# Patient Record
Sex: Male | Born: 1985 | Race: Black or African American | Hispanic: No
Health system: Western US, Academic
[De-identification: ages and names within clinical notes are randomized; demographics above are authoritative.]

## PROBLEM LIST (undated history)

## (undated) MED ORDER — WARFARIN SODIUM 5 MG OR TABS
ORAL_TABLET | ORAL | Status: AC
Start: 2019-03-10 — End: ?

## (undated) MED ORDER — ENOXAPARIN SODIUM 80 MG/0.8ML IJ SOSY
PREFILLED_SYRINGE | INTRAMUSCULAR | 0 refills | Status: AC
Start: 2018-04-13 — End: ?

## (undated) MED ORDER — ENOXAPARIN SODIUM 80 MG/0.8ML IJ SOSY
80.0000 mg | PREFILLED_SYRINGE | Freq: Two times a day (BID) | INTRAMUSCULAR | 0 refills | Status: AC
Start: 2018-03-24 — End: ?

## (undated) MED ORDER — WARFARIN SODIUM 5 MG OR TABS
ORAL_TABLET | ORAL | 0 refills | Status: AC
Start: 2021-08-14 — End: ?

## (undated) MED ORDER — WARFARIN SODIUM 5 MG OR TABS
ORAL_TABLET | ORAL | Status: AC
Start: 2019-02-23 — End: ?

## (undated) MED ORDER — WARFARIN SODIUM 5 MG OR TABS
ORAL_TABLET | ORAL | 0 refills | Status: AC
Start: 2023-03-23 — End: ?

## (undated) MED ORDER — WARFARIN SODIUM 5 MG OR TABS
ORAL_TABLET | ORAL | 0 refills | Status: AC
Start: 2019-06-14 — End: ?

## (undated) NOTE — Progress Notes (Signed)
Transcription accepted by Dedra Skeens LYNN on 05/21/2012 at 10:36 AM  ------  INTERNAL MEDICINE CLINIC NOTE    IDENTIFICATION:  The patient is a 86 year old male here today for ER followup.      HISTORY OF PRESENT ILLNESS:  1.  On August 9, the patient developed a headache at 8 a.m.  He described this as a throbbing headache on the right side of his head, which was associated with bilateral blurry vision.  He has a history of headaches in the past, which he describes as happening every few months, occurring on one or the other side of his head and generally lasting for 30 minutes.  Headache last week was different, as it persisted longer than for 30 minutes and was associated with bilateral blurry vision.  He reported a headache to his boss is at work and was instructed to seek medical attention.  He went to sit down and felt somewhat dizzy and 911 was called.      The patient was evaluated in the emergency room at the [facility].  There he had evaluation including CT and CTA of the head and neck.  These showed no evidence of a new stroke.  Per the ER report, Neurology was consulted.  There is no note from the neurologist.  Based on the ER note, it was felt that his headache and blurry vision were most likely associated with migraine or anxiety; however, the patient was told by a white-haired neurologist wearing a tie that he had had a small stroke.  He was instructed to start on baby aspirin and to consult with the anticoagulation as his INR was 2.3 and his goal is 2.5 to 3.5.      Since his ER visit, he has felt tired, but has had no recurrence of headache or any neurologic symptoms.  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  The patient is on chronic Coumadin.  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    2.   Contraception.  The patient has a daughter.  He is currently working at a low paying job.  He realizes that he does not have the means to have additional children.  He is interested in pursuing a vasectomy.      PHYSICAL EXAMINATION:    General:    The patient is a well-appearing male.    Vital Signs:    Blood pressure of 120/70, pulse 64 and regular, weight of 164 pounds.    Heent:    Pupils are equal and reactive to light and accommodation.  Extraocular movements intact.  He has normal finger-nose-finger.  Negative Romberg's.        ASSESSMENT AND PLAN:  1.  Headache.  The patient has a history consistent with possible migraine headaches.  His episodes last week, which was associated with bilateral blurry vision may have represented migraine versus other cause of headache.  It is reassuring that his CT scans showed no new changes from previous examinations.  I discussed the case with the neurology consultation resident who was not the resident who saw the patient.  He was unable to say whether or not the patient may have had a small TIA.  Regardless, the patient will have his Coumadin adjusted to reach goal of 2.5 to 3.5.  He has followup with Cardiology scheduled  for September.    2.  Contraception.  I discussed with the patient that may not be possible to even consider a vasectomy in someone his age and if we did consider it there were some coagulation issues.  I will relay this information to his cardiologist and they can discuss at his upcoming visit as well.    Overall plan, the patient to be seen in Cardiology Clinic in September.  He was encouraged to continue to seek medical attention should he develop new neurologic symptoms.  If he continues to have headaches consistent with migraines, we will have to work on an abortive therapy as triptans would not be safe given his underlying vascular disease.

## (undated) NOTE — Progress Notes (Signed)
Transcription accepted by Dedra Skeens LYNN on 09/19/2011 at 11:16 AM  ------  INTERNAL MEDICINE CLINIC NOTE:        IDENTIFICATION:  The patient is a 89 year old male here today for an annual physical examination.      ISSUES DICUSSED TODAY:  Walter Rice is doing well.  He is living with his sister.  On a typical weekday he is up early, takes the bus to and from work, is home for dinner and typically goes to bed early.  He does not have any difficulties with concentration or tasks at work.  On the weekends he tries to do some exercise and spends time with his friends and girlfriend.  He does not have any physical limitation and does not note any numbness or weakness.  He is trying to gain visitation rights for his daughter.    PROBLEM LIST:  1.  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  Mitral valve regurgitation resulting in mechanical mitral valve replacement with a 27-mm St. Jude prosthetic valve in April 1993.  Coronary fistula requiring right coronary embolization.  2.  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 (Neuropsychological evaluation by Bonita Quin on 08/02/2009)  3.  Sinus node dysfunction status post permanent pacemaker with Medtronic pacer.      ALLERGIES:  SULFAMETHOXAZOLE.      CURRENT MEDICATIONS:  Coumadin.      HABITS:  The patient is active at work and exercises periodically on the weekend.  He has a girlfriend but is not currently in a sexual relationship.  He drinks occasional alcohol, but does not do any drugs.  He does not own a gun.      FAMILY HISTORY:  Family history is largely unknown.  He thinks his mom had diabetes and hypertension.  Habits as above.  He drinks rare alcohol.  He does smoke cigarettes approximately a pack of cigarettes per day.      REVIEW OF  SYSTEMS:  Remarkable for back pain yesterday after standing for a long period of time at work.  Ankle pain for which I saw him in September has completely resolved.      PHYSICAL EXAMINATION:    General:    The patient is a well-appearing male with blood pressure 110/60, pulse 68 and regular, and weight of 169 pounds.    Cardiovascular:    Regular rate and rhythm.  He has an extra heart sound in the left lower sternal border.    Heent:    Oropharynx is without erythema, exudate, or lesions.  TMs are normal bilaterally.    Neck:    Full range of motion without adenopathy.    Back:    Without CVA or spinal tenderness.  There are some postinflammatory changes on his back and some acne in the beard area.    Abdomen:    Soft, nontender, and nondistended.  Bowel tones present without hepatosplenomegaly.    Genitourinary:    On genital examination, he has no testicular masses.    Neurologic:  Answers questions appropriately. Normal range of mood.  Asks appropriate questions about his health.   Normal 3 item recall initially and at 5 minutes.  CN 2-12 intact  Normal muscle tone and strength in all extremities.  Normal gait.  Negative rhomberg.  Symmetric reflexes at knees and ankles  ASSESSMENT AND PLAN:  This is a healthcare maintenance visit for a 31 year old man with congenital heart disease and stroke resulting in some short-term memory and neurocognitive deficits last neuropsychologic evaluation in 2010.  He has had good follow-up with anticoagulation clinic and cardiology and has had no new neurologic events since the time of that evaluation. He is functioning well at work.  He is filing a court motion for visitation rights for his daughter.  On neurologic examination today he has no deficits that would interfere with his ability to visit with his daughter.  His neuropsychological function, if changed at all, should be better than the testing done in 2010.  He has not had any new neurologic events since that  time and is successfully working.    We reviewed his overall health risks.  He is at higher risk for diabetes given his mother's diabetes.  We also discussed other cardiovascular risks including sedentary lifestyle, smoking, and poor diet.  We reviewed appropriate diet and exercise with him today.  I also discussed safety habits both in terms of drugs, alcohol, guns, and sexual safety.  He asked to be screened for human immunodeficiency virus (HIV) and hepatitis, which we will do today.      We also reviewed his recent low back pain, which is likely due to standing for long periods of time at work.  I recommended that he do some general conditioning and core conditioning including sit-ups, push-ups, and superman exercises.  He was given written instructions and should return in approximately a year for physical examination.

---
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
## 2005-01-04 ENCOUNTER — Ambulatory Visit (HOSPITAL_BASED_OUTPATIENT_CLINIC_OR_DEPARTMENT_OTHER): Payer: Medicaid Other | Admitting: Cardiovascular Disease

## 2005-01-04 DIAGNOSIS — Q232 Congenital mitral stenosis: Secondary | ICD-10-CM

## 2006-10-25 ENCOUNTER — Encounter (HOSPITAL_COMMUNITY): Payer: Medicaid Other | Admitting: Critical Care Medicine

## 2006-10-25 ENCOUNTER — Ambulatory Visit: Payer: Self-pay

## 2006-10-25 DIAGNOSIS — I519 Heart disease, unspecified: Secondary | ICD-10-CM

## 2006-10-25 DIAGNOSIS — I6350 Cerebral infarction due to unspecified occlusion or stenosis of unspecified cerebral artery: Secondary | ICD-10-CM

## 2006-10-25 DIAGNOSIS — I6789 Other cerebrovascular disease: Secondary | ICD-10-CM

## 2006-10-26 ENCOUNTER — Inpatient Hospital Stay: Payer: Self-pay

## 2006-10-26 DIAGNOSIS — Q212 Atrioventricular septal defect, unspecified as to partial or complete: Secondary | ICD-10-CM

## 2006-10-26 DIAGNOSIS — R404 Transient alteration of awareness: Secondary | ICD-10-CM

## 2006-10-26 DIAGNOSIS — I6350 Cerebral infarction due to unspecified occlusion or stenosis of unspecified cerebral artery: Secondary | ICD-10-CM

## 2006-10-27 ENCOUNTER — Inpatient Hospital Stay: Payer: Self-pay

## 2006-10-27 DIAGNOSIS — T82190A Other mechanical complication of cardiac electrode, initial encounter: Secondary | ICD-10-CM

## 2006-10-27 DIAGNOSIS — Q212 Atrioventricular septal defect, unspecified as to partial or complete: Secondary | ICD-10-CM

## 2006-10-27 DIAGNOSIS — R404 Transient alteration of awareness: Secondary | ICD-10-CM

## 2006-10-27 DIAGNOSIS — I6340 Cerebral infarction due to embolism of unspecified cerebral artery: Secondary | ICD-10-CM

## 2006-10-27 DIAGNOSIS — Z01818 Encounter for other preprocedural examination: Secondary | ICD-10-CM

## 2006-10-27 DIAGNOSIS — I6789 Other cerebrovascular disease: Secondary | ICD-10-CM

## 2006-10-28 DIAGNOSIS — Z954 Presence of other heart-valve replacement: Secondary | ICD-10-CM

## 2006-10-28 DIAGNOSIS — Z950 Presence of cardiac pacemaker: Secondary | ICD-10-CM

## 2006-10-28 DIAGNOSIS — Q249 Congenital malformation of heart, unspecified: Secondary | ICD-10-CM

## 2006-10-28 DIAGNOSIS — I442 Atrioventricular block, complete: Secondary | ICD-10-CM

## 2006-10-29 DIAGNOSIS — I6789 Other cerebrovascular disease: Secondary | ICD-10-CM

## 2006-10-30 DIAGNOSIS — I6350 Cerebral infarction due to unspecified occlusion or stenosis of unspecified cerebral artery: Secondary | ICD-10-CM

## 2006-10-30 DIAGNOSIS — IMO0002 Reserved for concepts with insufficient information to code with codable children: Secondary | ICD-10-CM

## 2006-11-04 ENCOUNTER — Encounter: Payer: Self-pay | Admitting: Physician Assistant

## 2006-11-06 ENCOUNTER — Ambulatory Visit: Payer: Self-pay

## 2006-11-10 ENCOUNTER — Ambulatory Visit: Payer: Self-pay

## 2006-11-11 ENCOUNTER — Encounter: Payer: Self-pay | Admitting: Physician Assistant

## 2006-11-13 ENCOUNTER — Ambulatory Visit: Payer: Self-pay

## 2006-11-13 ENCOUNTER — Encounter: Payer: Self-pay | Admitting: Physician Assistant

## 2006-11-13 DIAGNOSIS — Z950 Presence of cardiac pacemaker: Secondary | ICD-10-CM

## 2006-11-13 DIAGNOSIS — I442 Atrioventricular block, complete: Secondary | ICD-10-CM

## 2006-11-26 ENCOUNTER — Ambulatory Visit: Payer: Self-pay

## 2006-11-26 ENCOUNTER — Encounter: Payer: Medicaid Other | Admitting: Specialist

## 2006-11-26 DIAGNOSIS — Z954 Presence of other heart-valve replacement: Secondary | ICD-10-CM

## 2006-11-26 DIAGNOSIS — Z7901 Long term (current) use of anticoagulants: Secondary | ICD-10-CM

## 2006-11-30 ENCOUNTER — Ambulatory Visit: Payer: Self-pay

## 2006-12-07 ENCOUNTER — Ambulatory Visit (HOSPITAL_BASED_OUTPATIENT_CLINIC_OR_DEPARTMENT_OTHER): Payer: Self-pay

## 2006-12-11 ENCOUNTER — Ambulatory Visit (HOSPITAL_BASED_OUTPATIENT_CLINIC_OR_DEPARTMENT_OTHER): Payer: Self-pay

## 2006-12-18 ENCOUNTER — Ambulatory Visit (HOSPITAL_BASED_OUTPATIENT_CLINIC_OR_DEPARTMENT_OTHER): Payer: Self-pay

## 2007-01-01 ENCOUNTER — Ambulatory Visit (HOSPITAL_BASED_OUTPATIENT_CLINIC_OR_DEPARTMENT_OTHER): Payer: Self-pay

## 2007-01-08 ENCOUNTER — Ambulatory Visit (HOSPITAL_BASED_OUTPATIENT_CLINIC_OR_DEPARTMENT_OTHER): Payer: Self-pay

## 2007-01-12 ENCOUNTER — Ambulatory Visit (HOSPITAL_BASED_OUTPATIENT_CLINIC_OR_DEPARTMENT_OTHER): Payer: Self-pay

## 2007-01-12 ENCOUNTER — Encounter (HOSPITAL_BASED_OUTPATIENT_CLINIC_OR_DEPARTMENT_OTHER): Payer: Self-pay | Admitting: Speech-Language Pathologist

## 2007-01-19 ENCOUNTER — Ambulatory Visit (HOSPITAL_BASED_OUTPATIENT_CLINIC_OR_DEPARTMENT_OTHER): Payer: Self-pay

## 2007-01-27 ENCOUNTER — Encounter (HOSPITAL_BASED_OUTPATIENT_CLINIC_OR_DEPARTMENT_OTHER): Payer: Medicaid Other | Admitting: Internal Medicine

## 2007-01-27 DIAGNOSIS — I6789 Other cerebrovascular disease: Secondary | ICD-10-CM

## 2007-01-27 DIAGNOSIS — I495 Sick sinus syndrome: Secondary | ICD-10-CM

## 2007-01-27 DIAGNOSIS — I359 Nonrheumatic aortic valve disorder, unspecified: Secondary | ICD-10-CM

## 2007-01-27 DIAGNOSIS — Z954 Presence of other heart-valve replacement: Secondary | ICD-10-CM

## 2007-01-27 DIAGNOSIS — I424 Endocardial fibroelastosis: Secondary | ICD-10-CM

## 2007-01-27 DIAGNOSIS — Z950 Presence of cardiac pacemaker: Secondary | ICD-10-CM

## 2007-01-28 ENCOUNTER — Telehealth (HOSPITAL_BASED_OUTPATIENT_CLINIC_OR_DEPARTMENT_OTHER): Payer: Medicaid Other

## 2007-02-02 ENCOUNTER — Encounter (HOSPITAL_BASED_OUTPATIENT_CLINIC_OR_DEPARTMENT_OTHER): Payer: PRIVATE HEALTH INSURANCE | Admitting: Speech-Language Pathologist

## 2007-02-02 ENCOUNTER — Ambulatory Visit (HOSPITAL_BASED_OUTPATIENT_CLINIC_OR_DEPARTMENT_OTHER): Payer: Medicaid Other

## 2007-02-12 ENCOUNTER — Ambulatory Visit (HOSPITAL_BASED_OUTPATIENT_CLINIC_OR_DEPARTMENT_OTHER): Payer: Medicaid Other

## 2007-03-08 ENCOUNTER — Ambulatory Visit (HOSPITAL_BASED_OUTPATIENT_CLINIC_OR_DEPARTMENT_OTHER): Payer: Medicaid Other

## 2007-03-16 ENCOUNTER — Encounter (HOSPITAL_BASED_OUTPATIENT_CLINIC_OR_DEPARTMENT_OTHER): Payer: Self-pay | Admitting: Speech-Language Pathologist

## 2007-03-22 ENCOUNTER — Telehealth (HOSPITAL_BASED_OUTPATIENT_CLINIC_OR_DEPARTMENT_OTHER): Payer: Medicaid Other

## 2007-03-22 ENCOUNTER — Encounter (HOSPITAL_BASED_OUTPATIENT_CLINIC_OR_DEPARTMENT_OTHER): Payer: Medicaid Other | Admitting: Speech-Language Pathologist

## 2007-04-01 ENCOUNTER — Encounter (HOSPITAL_BASED_OUTPATIENT_CLINIC_OR_DEPARTMENT_OTHER): Payer: Medicaid Other | Admitting: Cardiovascular Disease

## 2007-04-01 ENCOUNTER — Ambulatory Visit (HOSPITAL_BASED_OUTPATIENT_CLINIC_OR_DEPARTMENT_OTHER): Payer: Medicaid Other

## 2007-04-01 DIAGNOSIS — Q212 Atrioventricular septal defect, unspecified as to partial or complete: Secondary | ICD-10-CM

## 2007-04-01 DIAGNOSIS — Z950 Presence of cardiac pacemaker: Secondary | ICD-10-CM

## 2007-04-01 DIAGNOSIS — I442 Atrioventricular block, complete: Secondary | ICD-10-CM

## 2007-04-01 DIAGNOSIS — Z8673 Personal history of transient ischemic attack (TIA), and cerebral infarction without residual deficits: Secondary | ICD-10-CM

## 2007-04-01 DIAGNOSIS — Z954 Presence of other heart-valve replacement: Secondary | ICD-10-CM

## 2007-04-20 ENCOUNTER — Ambulatory Visit (HOSPITAL_BASED_OUTPATIENT_CLINIC_OR_DEPARTMENT_OTHER): Payer: Medicaid Other

## 2007-04-23 ENCOUNTER — Ambulatory Visit (HOSPITAL_BASED_OUTPATIENT_CLINIC_OR_DEPARTMENT_OTHER): Payer: Medicaid Other

## 2007-06-01 ENCOUNTER — Ambulatory Visit (HOSPITAL_BASED_OUTPATIENT_CLINIC_OR_DEPARTMENT_OTHER): Payer: No Typology Code available for payment source

## 2007-06-08 ENCOUNTER — Ambulatory Visit (HOSPITAL_BASED_OUTPATIENT_CLINIC_OR_DEPARTMENT_OTHER): Payer: No Typology Code available for payment source

## 2007-06-15 ENCOUNTER — Ambulatory Visit (HOSPITAL_BASED_OUTPATIENT_CLINIC_OR_DEPARTMENT_OTHER): Payer: No Typology Code available for payment source

## 2007-06-16 ENCOUNTER — Telehealth (HOSPITAL_BASED_OUTPATIENT_CLINIC_OR_DEPARTMENT_OTHER): Payer: No Typology Code available for payment source

## 2007-07-06 ENCOUNTER — Ambulatory Visit (HOSPITAL_BASED_OUTPATIENT_CLINIC_OR_DEPARTMENT_OTHER): Payer: No Typology Code available for payment source

## 2007-07-20 ENCOUNTER — Ambulatory Visit (HOSPITAL_BASED_OUTPATIENT_CLINIC_OR_DEPARTMENT_OTHER): Payer: No Typology Code available for payment source

## 2007-07-23 ENCOUNTER — Ambulatory Visit (HOSPITAL_BASED_OUTPATIENT_CLINIC_OR_DEPARTMENT_OTHER): Payer: No Typology Code available for payment source

## 2007-07-27 ENCOUNTER — Ambulatory Visit (HOSPITAL_BASED_OUTPATIENT_CLINIC_OR_DEPARTMENT_OTHER): Payer: No Typology Code available for payment source

## 2007-08-02 ENCOUNTER — Encounter (HOSPITAL_BASED_OUTPATIENT_CLINIC_OR_DEPARTMENT_OTHER): Payer: Medicaid Other | Admitting: Nurse Practitioner

## 2007-08-06 ENCOUNTER — Encounter (HOSPITAL_BASED_OUTPATIENT_CLINIC_OR_DEPARTMENT_OTHER): Payer: No Typology Code available for payment source | Admitting: Nurse Practitioner

## 2007-09-06 ENCOUNTER — Ambulatory Visit (HOSPITAL_BASED_OUTPATIENT_CLINIC_OR_DEPARTMENT_OTHER): Payer: No Typology Code available for payment source

## 2007-10-01 ENCOUNTER — Other Ambulatory Visit (HOSPITAL_BASED_OUTPATIENT_CLINIC_OR_DEPARTMENT_OTHER): Payer: No Typology Code available for payment source

## 2007-10-05 ENCOUNTER — Ambulatory Visit (HOSPITAL_BASED_OUTPATIENT_CLINIC_OR_DEPARTMENT_OTHER): Payer: No Typology Code available for payment source

## 2007-10-14 ENCOUNTER — Encounter (HOSPITAL_BASED_OUTPATIENT_CLINIC_OR_DEPARTMENT_OTHER): Payer: No Typology Code available for payment source | Admitting: Adult Congenital Heart Disease

## 2007-10-14 ENCOUNTER — Ambulatory Visit (HOSPITAL_BASED_OUTPATIENT_CLINIC_OR_DEPARTMENT_OTHER): Payer: No Typology Code available for payment source

## 2007-10-14 DIAGNOSIS — I6789 Other cerebrovascular disease: Secondary | ICD-10-CM

## 2007-10-14 DIAGNOSIS — Z954 Presence of other heart-valve replacement: Secondary | ICD-10-CM

## 2007-10-26 ENCOUNTER — Other Ambulatory Visit (HOSPITAL_BASED_OUTPATIENT_CLINIC_OR_DEPARTMENT_OTHER): Payer: Self-pay

## 2007-10-26 ENCOUNTER — Ambulatory Visit (HOSPITAL_BASED_OUTPATIENT_CLINIC_OR_DEPARTMENT_OTHER): Payer: Self-pay

## 2007-10-26 DIAGNOSIS — Q212 Atrioventricular septal defect, unspecified as to partial or complete: Secondary | ICD-10-CM

## 2007-12-21 ENCOUNTER — Ambulatory Visit (HOSPITAL_BASED_OUTPATIENT_CLINIC_OR_DEPARTMENT_OTHER): Payer: Self-pay

## 2008-03-28 ENCOUNTER — Ambulatory Visit (HOSPITAL_BASED_OUTPATIENT_CLINIC_OR_DEPARTMENT_OTHER): Payer: Self-pay

## 2008-04-11 ENCOUNTER — Ambulatory Visit (HOSPITAL_BASED_OUTPATIENT_CLINIC_OR_DEPARTMENT_OTHER): Payer: Self-pay

## 2008-04-13 ENCOUNTER — Encounter (HOSPITAL_BASED_OUTPATIENT_CLINIC_OR_DEPARTMENT_OTHER): Payer: Self-pay | Admitting: Cardiology Med

## 2008-04-13 ENCOUNTER — Encounter (HOSPITAL_BASED_OUTPATIENT_CLINIC_OR_DEPARTMENT_OTHER): Payer: No Typology Code available for payment source | Admitting: Adult Congenital Heart Disease

## 2008-04-13 DIAGNOSIS — Z954 Presence of other heart-valve replacement: Secondary | ICD-10-CM

## 2008-04-18 ENCOUNTER — Ambulatory Visit (HOSPITAL_BASED_OUTPATIENT_CLINIC_OR_DEPARTMENT_OTHER): Payer: Self-pay

## 2008-04-28 ENCOUNTER — Ambulatory Visit (HOSPITAL_BASED_OUTPATIENT_CLINIC_OR_DEPARTMENT_OTHER): Payer: Self-pay

## 2008-05-03 ENCOUNTER — Ambulatory Visit (HOSPITAL_BASED_OUTPATIENT_CLINIC_OR_DEPARTMENT_OTHER): Payer: Self-pay

## 2008-05-10 ENCOUNTER — Ambulatory Visit (HOSPITAL_BASED_OUTPATIENT_CLINIC_OR_DEPARTMENT_OTHER): Payer: Self-pay

## 2008-05-16 ENCOUNTER — Ambulatory Visit (HOSPITAL_BASED_OUTPATIENT_CLINIC_OR_DEPARTMENT_OTHER): Payer: Self-pay

## 2008-05-23 ENCOUNTER — Ambulatory Visit (HOSPITAL_BASED_OUTPATIENT_CLINIC_OR_DEPARTMENT_OTHER): Payer: Self-pay

## 2008-06-06 ENCOUNTER — Telehealth (HOSPITAL_BASED_OUTPATIENT_CLINIC_OR_DEPARTMENT_OTHER): Payer: Self-pay

## 2008-06-07 ENCOUNTER — Telehealth (HOSPITAL_BASED_OUTPATIENT_CLINIC_OR_DEPARTMENT_OTHER): Payer: Self-pay

## 2008-06-19 ENCOUNTER — Telehealth (HOSPITAL_BASED_OUTPATIENT_CLINIC_OR_DEPARTMENT_OTHER): Payer: Self-pay

## 2008-07-05 ENCOUNTER — Telehealth (HOSPITAL_BASED_OUTPATIENT_CLINIC_OR_DEPARTMENT_OTHER): Payer: Self-pay

## 2008-07-19 ENCOUNTER — Telehealth (HOSPITAL_BASED_OUTPATIENT_CLINIC_OR_DEPARTMENT_OTHER): Payer: Self-pay

## 2008-08-23 ENCOUNTER — Telehealth (HOSPITAL_BASED_OUTPATIENT_CLINIC_OR_DEPARTMENT_OTHER): Payer: Self-pay

## 2008-09-06 ENCOUNTER — Telehealth (HOSPITAL_BASED_OUTPATIENT_CLINIC_OR_DEPARTMENT_OTHER): Payer: Self-pay

## 2008-09-15 ENCOUNTER — Telehealth (HOSPITAL_BASED_OUTPATIENT_CLINIC_OR_DEPARTMENT_OTHER): Payer: Self-pay

## 2008-09-27 ENCOUNTER — Telehealth (HOSPITAL_BASED_OUTPATIENT_CLINIC_OR_DEPARTMENT_OTHER): Payer: Self-pay

## 2008-10-03 ENCOUNTER — Telehealth (HOSPITAL_BASED_OUTPATIENT_CLINIC_OR_DEPARTMENT_OTHER): Payer: Self-pay

## 2008-10-19 ENCOUNTER — Encounter (HOSPITAL_BASED_OUTPATIENT_CLINIC_OR_DEPARTMENT_OTHER): Payer: Self-pay | Admitting: Internal Medicine

## 2008-10-19 ENCOUNTER — Ambulatory Visit (HOSPITAL_BASED_OUTPATIENT_CLINIC_OR_DEPARTMENT_OTHER): Payer: Self-pay

## 2008-10-19 DIAGNOSIS — Z954 Presence of other heart-valve replacement: Secondary | ICD-10-CM

## 2008-10-19 DIAGNOSIS — Q2111 Secundum atrial septal defect: Secondary | ICD-10-CM

## 2008-11-02 ENCOUNTER — Telehealth (HOSPITAL_BASED_OUTPATIENT_CLINIC_OR_DEPARTMENT_OTHER): Payer: Self-pay

## 2008-11-16 ENCOUNTER — Telehealth (HOSPITAL_BASED_OUTPATIENT_CLINIC_OR_DEPARTMENT_OTHER): Payer: Self-pay

## 2008-11-23 ENCOUNTER — Telehealth (HOSPITAL_BASED_OUTPATIENT_CLINIC_OR_DEPARTMENT_OTHER): Payer: Self-pay

## 2008-12-08 ENCOUNTER — Telehealth (HOSPITAL_BASED_OUTPATIENT_CLINIC_OR_DEPARTMENT_OTHER): Payer: Self-pay

## 2008-12-21 ENCOUNTER — Telehealth (HOSPITAL_BASED_OUTPATIENT_CLINIC_OR_DEPARTMENT_OTHER): Payer: Self-pay

## 2009-01-11 ENCOUNTER — Telehealth (HOSPITAL_BASED_OUTPATIENT_CLINIC_OR_DEPARTMENT_OTHER): Payer: Self-pay

## 2009-01-18 ENCOUNTER — Telehealth (HOSPITAL_BASED_OUTPATIENT_CLINIC_OR_DEPARTMENT_OTHER): Payer: Self-pay

## 2009-01-26 ENCOUNTER — Ambulatory Visit (HOSPITAL_BASED_OUTPATIENT_CLINIC_OR_DEPARTMENT_OTHER): Payer: Self-pay

## 2009-02-08 ENCOUNTER — Ambulatory Visit (HOSPITAL_BASED_OUTPATIENT_CLINIC_OR_DEPARTMENT_OTHER): Payer: Self-pay

## 2009-02-22 ENCOUNTER — Ambulatory Visit (HOSPITAL_BASED_OUTPATIENT_CLINIC_OR_DEPARTMENT_OTHER): Payer: Self-pay

## 2009-03-15 ENCOUNTER — Ambulatory Visit (HOSPITAL_BASED_OUTPATIENT_CLINIC_OR_DEPARTMENT_OTHER): Payer: Self-pay

## 2009-04-12 ENCOUNTER — Ambulatory Visit (HOSPITAL_BASED_OUTPATIENT_CLINIC_OR_DEPARTMENT_OTHER): Payer: Medicaid Other

## 2009-04-19 ENCOUNTER — Ambulatory Visit (HOSPITAL_BASED_OUTPATIENT_CLINIC_OR_DEPARTMENT_OTHER): Payer: Medicaid Other

## 2009-04-26 ENCOUNTER — Ambulatory Visit (HOSPITAL_BASED_OUTPATIENT_CLINIC_OR_DEPARTMENT_OTHER): Payer: Medicaid Other

## 2009-04-26 ENCOUNTER — Other Ambulatory Visit (HOSPITAL_BASED_OUTPATIENT_CLINIC_OR_DEPARTMENT_OTHER): Payer: Medicaid Other

## 2009-04-26 ENCOUNTER — Encounter (HOSPITAL_BASED_OUTPATIENT_CLINIC_OR_DEPARTMENT_OTHER): Payer: Medicaid Other | Admitting: Cardiovascular Disease

## 2009-04-26 DIAGNOSIS — Q231 Congenital insufficiency of aortic valve: Secondary | ICD-10-CM

## 2009-04-26 DIAGNOSIS — Q212 Atrioventricular septal defect, unspecified as to partial or complete: Secondary | ICD-10-CM

## 2009-04-26 DIAGNOSIS — Z954 Presence of other heart-valve replacement: Secondary | ICD-10-CM

## 2009-04-26 DIAGNOSIS — Z950 Presence of cardiac pacemaker: Secondary | ICD-10-CM

## 2009-04-26 DIAGNOSIS — Q2111 Secundum atrial septal defect: Secondary | ICD-10-CM

## 2009-05-10 ENCOUNTER — Ambulatory Visit (HOSPITAL_BASED_OUTPATIENT_CLINIC_OR_DEPARTMENT_OTHER): Payer: Medicaid Other

## 2009-05-10 ENCOUNTER — Ambulatory Visit: Payer: Medicaid Other | Attending: Cardiovascular Disease | Admitting: Pediatrics

## 2009-05-10 DIAGNOSIS — Z954 Presence of other heart-valve replacement: Secondary | ICD-10-CM | POA: Insufficient documentation

## 2009-05-10 DIAGNOSIS — I495 Sick sinus syndrome: Secondary | ICD-10-CM

## 2009-05-10 DIAGNOSIS — Q249 Congenital malformation of heart, unspecified: Secondary | ICD-10-CM

## 2009-05-10 DIAGNOSIS — Z950 Presence of cardiac pacemaker: Secondary | ICD-10-CM

## 2009-05-10 DIAGNOSIS — Z5181 Encounter for therapeutic drug level monitoring: Secondary | ICD-10-CM | POA: Insufficient documentation

## 2009-05-10 DIAGNOSIS — I442 Atrioventricular block, complete: Secondary | ICD-10-CM | POA: Insufficient documentation

## 2009-05-10 DIAGNOSIS — Z7901 Long term (current) use of anticoagulants: Secondary | ICD-10-CM | POA: Insufficient documentation

## 2009-05-17 ENCOUNTER — Ambulatory Visit: Payer: Medicaid Other | Attending: Cardiovascular Disease

## 2009-05-17 DIAGNOSIS — Z954 Presence of other heart-valve replacement: Secondary | ICD-10-CM | POA: Insufficient documentation

## 2009-05-17 DIAGNOSIS — Z5181 Encounter for therapeutic drug level monitoring: Secondary | ICD-10-CM | POA: Insufficient documentation

## 2009-05-17 DIAGNOSIS — Z7901 Long term (current) use of anticoagulants: Secondary | ICD-10-CM | POA: Insufficient documentation

## 2009-05-22 ENCOUNTER — Ambulatory Visit: Payer: Medicaid Other | Admitting: Clinical

## 2009-05-22 ENCOUNTER — Ambulatory Visit (HOSPITAL_BASED_OUTPATIENT_CLINIC_OR_DEPARTMENT_OTHER): Payer: Medicaid Other

## 2009-05-23 ENCOUNTER — Ambulatory Visit: Payer: Medicaid Other | Attending: Cardiovascular Disease

## 2009-05-23 DIAGNOSIS — I69919 Unspecified symptoms and signs involving cognitive functions following unspecified cerebrovascular disease: Secondary | ICD-10-CM | POA: Insufficient documentation

## 2009-06-05 ENCOUNTER — Ambulatory Visit: Payer: Medicaid Other | Attending: Pharmacotherapy

## 2009-06-05 DIAGNOSIS — Z954 Presence of other heart-valve replacement: Secondary | ICD-10-CM | POA: Insufficient documentation

## 2009-06-05 DIAGNOSIS — Z7901 Long term (current) use of anticoagulants: Secondary | ICD-10-CM | POA: Insufficient documentation

## 2009-06-05 DIAGNOSIS — Z5181 Encounter for therapeutic drug level monitoring: Secondary | ICD-10-CM | POA: Insufficient documentation

## 2009-06-14 ENCOUNTER — Ambulatory Visit: Payer: Medicaid Other | Admitting: Clinical

## 2009-06-14 DIAGNOSIS — I6990 Unspecified sequelae of unspecified cerebrovascular disease: Secondary | ICD-10-CM

## 2009-06-26 ENCOUNTER — Ambulatory Visit: Payer: Medicaid Other | Attending: Cardiovascular Disease

## 2009-06-26 DIAGNOSIS — Z5181 Encounter for therapeutic drug level monitoring: Secondary | ICD-10-CM | POA: Insufficient documentation

## 2009-06-26 DIAGNOSIS — Z954 Presence of other heart-valve replacement: Secondary | ICD-10-CM | POA: Insufficient documentation

## 2009-06-26 DIAGNOSIS — Z7901 Long term (current) use of anticoagulants: Secondary | ICD-10-CM | POA: Insufficient documentation

## 2009-07-19 ENCOUNTER — Ambulatory Visit (HOSPITAL_BASED_OUTPATIENT_CLINIC_OR_DEPARTMENT_OTHER): Payer: Self-pay

## 2009-07-20 ENCOUNTER — Ambulatory Visit: Payer: Medicaid Other | Attending: Cardiovascular Disease

## 2009-07-20 DIAGNOSIS — Z5181 Encounter for therapeutic drug level monitoring: Secondary | ICD-10-CM | POA: Insufficient documentation

## 2009-07-20 DIAGNOSIS — I6789 Other cerebrovascular disease: Secondary | ICD-10-CM | POA: Insufficient documentation

## 2009-07-20 DIAGNOSIS — Z954 Presence of other heart-valve replacement: Secondary | ICD-10-CM | POA: Insufficient documentation

## 2009-07-20 DIAGNOSIS — Z7901 Long term (current) use of anticoagulants: Secondary | ICD-10-CM | POA: Insufficient documentation

## 2009-08-20 ENCOUNTER — Ambulatory Visit
Admit: 2009-08-20 | Discharge: 2009-08-20 | Disposition: A | Discharge status: Home / Self Care | Payer: Medicaid Other | Attending: Pharmacotherapy | Admitting: Pharmacotherapy

## 2009-08-20 DIAGNOSIS — Z954 Presence of other heart-valve replacement: Secondary | ICD-10-CM | POA: Insufficient documentation

## 2009-08-20 DIAGNOSIS — Z7901 Long term (current) use of anticoagulants: Secondary | ICD-10-CM | POA: Insufficient documentation

## 2009-08-20 DIAGNOSIS — Z5181 Encounter for therapeutic drug level monitoring: Secondary | ICD-10-CM | POA: Insufficient documentation

## 2009-08-21 ENCOUNTER — Ambulatory Visit: Payer: Medicaid Other

## 2009-09-17 ENCOUNTER — Ambulatory Visit (HOSPITAL_BASED_OUTPATIENT_CLINIC_OR_DEPARTMENT_OTHER): Payer: Self-pay

## 2009-09-26 ENCOUNTER — Ambulatory Visit: Payer: Medicaid Other | Attending: Pharmacotherapy

## 2009-09-26 DIAGNOSIS — Z954 Presence of other heart-valve replacement: Secondary | ICD-10-CM | POA: Insufficient documentation

## 2009-09-26 DIAGNOSIS — Z7901 Long term (current) use of anticoagulants: Secondary | ICD-10-CM | POA: Insufficient documentation

## 2009-09-26 DIAGNOSIS — Z5181 Encounter for therapeutic drug level monitoring: Secondary | ICD-10-CM | POA: Insufficient documentation

## 2009-10-10 ENCOUNTER — Ambulatory Visit: Payer: Medicaid Other | Attending: Pharmacotherapy

## 2009-10-10 DIAGNOSIS — Z954 Presence of other heart-valve replacement: Secondary | ICD-10-CM | POA: Insufficient documentation

## 2009-10-10 DIAGNOSIS — Z7901 Long term (current) use of anticoagulants: Secondary | ICD-10-CM | POA: Insufficient documentation

## 2009-10-10 DIAGNOSIS — Z5181 Encounter for therapeutic drug level monitoring: Secondary | ICD-10-CM | POA: Insufficient documentation

## 2009-10-18 ENCOUNTER — Encounter (HOSPITAL_BASED_OUTPATIENT_CLINIC_OR_DEPARTMENT_OTHER): Payer: Medicaid Other | Admitting: Cardiovascular Disease

## 2009-11-07 ENCOUNTER — Ambulatory Visit: Payer: Medicaid Other | Attending: Pharmacotherapy

## 2009-11-07 DIAGNOSIS — Z7901 Long term (current) use of anticoagulants: Secondary | ICD-10-CM | POA: Insufficient documentation

## 2009-11-07 DIAGNOSIS — Z5181 Encounter for therapeutic drug level monitoring: Secondary | ICD-10-CM | POA: Insufficient documentation

## 2009-11-07 DIAGNOSIS — Z954 Presence of other heart-valve replacement: Secondary | ICD-10-CM | POA: Insufficient documentation

## 2009-11-29 ENCOUNTER — Ambulatory Visit (HOSPITAL_BASED_OUTPATIENT_CLINIC_OR_DEPARTMENT_OTHER): Payer: Medicaid Other | Admitting: Pediatrics

## 2009-11-29 ENCOUNTER — Ambulatory Visit: Payer: Medicaid Other | Attending: Cardiovascular Disease | Admitting: Cardiovascular Disease

## 2009-11-29 DIAGNOSIS — I495 Sick sinus syndrome: Secondary | ICD-10-CM

## 2009-11-29 DIAGNOSIS — Z954 Presence of other heart-valve replacement: Secondary | ICD-10-CM

## 2009-11-29 DIAGNOSIS — Q2111 Secundum atrial septal defect: Secondary | ICD-10-CM | POA: Insufficient documentation

## 2009-11-29 DIAGNOSIS — Z950 Presence of cardiac pacemaker: Secondary | ICD-10-CM | POA: Insufficient documentation

## 2009-11-29 DIAGNOSIS — Q249 Congenital malformation of heart, unspecified: Secondary | ICD-10-CM

## 2009-12-05 ENCOUNTER — Ambulatory Visit: Payer: Medicaid Other | Attending: Pharmacotherapy

## 2009-12-05 DIAGNOSIS — Z7901 Long term (current) use of anticoagulants: Secondary | ICD-10-CM | POA: Insufficient documentation

## 2009-12-05 DIAGNOSIS — Z954 Presence of other heart-valve replacement: Secondary | ICD-10-CM | POA: Insufficient documentation

## 2009-12-05 DIAGNOSIS — Z5181 Encounter for therapeutic drug level monitoring: Secondary | ICD-10-CM | POA: Insufficient documentation

## 2009-12-14 ENCOUNTER — Ambulatory Visit: Payer: Medicaid Other | Attending: Pharmacotherapy

## 2009-12-14 DIAGNOSIS — Z7901 Long term (current) use of anticoagulants: Secondary | ICD-10-CM | POA: Insufficient documentation

## 2009-12-14 DIAGNOSIS — Z954 Presence of other heart-valve replacement: Secondary | ICD-10-CM | POA: Insufficient documentation

## 2009-12-14 DIAGNOSIS — Z5181 Encounter for therapeutic drug level monitoring: Secondary | ICD-10-CM | POA: Insufficient documentation

## 2010-01-15 ENCOUNTER — Ambulatory Visit: Payer: Medicaid Other | Attending: Pharmacotherapy

## 2010-01-15 DIAGNOSIS — Z7901 Long term (current) use of anticoagulants: Secondary | ICD-10-CM | POA: Insufficient documentation

## 2010-01-15 DIAGNOSIS — Z5181 Encounter for therapeutic drug level monitoring: Secondary | ICD-10-CM | POA: Insufficient documentation

## 2010-01-15 DIAGNOSIS — Z954 Presence of other heart-valve replacement: Secondary | ICD-10-CM | POA: Insufficient documentation

## 2010-01-29 ENCOUNTER — Ambulatory Visit: Payer: Medicaid Other | Attending: Pharmacotherapy

## 2010-01-29 DIAGNOSIS — Z5181 Encounter for therapeutic drug level monitoring: Secondary | ICD-10-CM | POA: Insufficient documentation

## 2010-01-29 DIAGNOSIS — Z954 Presence of other heart-valve replacement: Secondary | ICD-10-CM | POA: Insufficient documentation

## 2010-01-29 DIAGNOSIS — Z7901 Long term (current) use of anticoagulants: Secondary | ICD-10-CM | POA: Insufficient documentation

## 2010-02-14 ENCOUNTER — Ambulatory Visit: Payer: Medicaid Other | Attending: Pharmacotherapy

## 2010-02-14 DIAGNOSIS — Z954 Presence of other heart-valve replacement: Secondary | ICD-10-CM | POA: Insufficient documentation

## 2010-02-14 DIAGNOSIS — Z7901 Long term (current) use of anticoagulants: Secondary | ICD-10-CM | POA: Insufficient documentation

## 2010-02-14 DIAGNOSIS — Z5181 Encounter for therapeutic drug level monitoring: Secondary | ICD-10-CM | POA: Insufficient documentation

## 2010-02-28 ENCOUNTER — Telehealth (HOSPITAL_BASED_OUTPATIENT_CLINIC_OR_DEPARTMENT_OTHER): Payer: Self-pay

## 2010-03-01 ENCOUNTER — Ambulatory Visit: Payer: Medicaid Other | Attending: Pharmacotherapy

## 2010-03-01 DIAGNOSIS — Z954 Presence of other heart-valve replacement: Secondary | ICD-10-CM | POA: Insufficient documentation

## 2010-03-01 DIAGNOSIS — Z5181 Encounter for therapeutic drug level monitoring: Secondary | ICD-10-CM | POA: Insufficient documentation

## 2010-03-01 DIAGNOSIS — Z7901 Long term (current) use of anticoagulants: Secondary | ICD-10-CM | POA: Insufficient documentation

## 2010-03-21 ENCOUNTER — Ambulatory Visit: Payer: Medicaid Other | Attending: Pharmacotherapy

## 2010-03-21 DIAGNOSIS — Z954 Presence of other heart-valve replacement: Secondary | ICD-10-CM | POA: Insufficient documentation

## 2010-03-21 DIAGNOSIS — Z7901 Long term (current) use of anticoagulants: Secondary | ICD-10-CM | POA: Insufficient documentation

## 2010-03-21 DIAGNOSIS — Z5181 Encounter for therapeutic drug level monitoring: Secondary | ICD-10-CM | POA: Insufficient documentation

## 2010-04-04 ENCOUNTER — Ambulatory Visit: Payer: Medicaid Other | Attending: Pharmacotherapy

## 2010-04-04 DIAGNOSIS — Z954 Presence of other heart-valve replacement: Secondary | ICD-10-CM | POA: Insufficient documentation

## 2010-04-04 DIAGNOSIS — Z7901 Long term (current) use of anticoagulants: Secondary | ICD-10-CM | POA: Insufficient documentation

## 2010-04-04 DIAGNOSIS — Z5181 Encounter for therapeutic drug level monitoring: Secondary | ICD-10-CM | POA: Insufficient documentation

## 2010-04-26 ENCOUNTER — Ambulatory Visit: Payer: Medicaid Other | Attending: Pharmacotherapy

## 2010-04-26 DIAGNOSIS — Z954 Presence of other heart-valve replacement: Secondary | ICD-10-CM | POA: Insufficient documentation

## 2010-04-26 DIAGNOSIS — Z7901 Long term (current) use of anticoagulants: Secondary | ICD-10-CM | POA: Insufficient documentation

## 2010-04-26 DIAGNOSIS — Z5181 Encounter for therapeutic drug level monitoring: Secondary | ICD-10-CM | POA: Insufficient documentation

## 2010-04-30 ENCOUNTER — Ambulatory Visit: Payer: Medicaid Other | Attending: Cardiovascular Disease

## 2010-04-30 ENCOUNTER — Ambulatory Visit (HOSPITAL_BASED_OUTPATIENT_CLINIC_OR_DEPARTMENT_OTHER): Payer: Medicaid Other

## 2010-04-30 DIAGNOSIS — Q200 Common arterial trunk: Secondary | ICD-10-CM | POA: Insufficient documentation

## 2010-04-30 DIAGNOSIS — Z954 Presence of other heart-valve replacement: Secondary | ICD-10-CM

## 2010-04-30 DIAGNOSIS — Z7901 Long term (current) use of anticoagulants: Secondary | ICD-10-CM | POA: Insufficient documentation

## 2010-04-30 DIAGNOSIS — Z5181 Encounter for therapeutic drug level monitoring: Secondary | ICD-10-CM | POA: Insufficient documentation

## 2010-05-09 ENCOUNTER — Ambulatory Visit: Payer: Medicaid Other | Attending: Pharmacotherapy

## 2010-05-09 DIAGNOSIS — Z5181 Encounter for therapeutic drug level monitoring: Secondary | ICD-10-CM | POA: Insufficient documentation

## 2010-05-09 DIAGNOSIS — Z7901 Long term (current) use of anticoagulants: Secondary | ICD-10-CM | POA: Insufficient documentation

## 2010-05-09 DIAGNOSIS — Z954 Presence of other heart-valve replacement: Secondary | ICD-10-CM | POA: Insufficient documentation

## 2010-05-30 ENCOUNTER — Ambulatory Visit (HOSPITAL_BASED_OUTPATIENT_CLINIC_OR_DEPARTMENT_OTHER): Payer: Medicaid Other

## 2010-05-30 ENCOUNTER — Ambulatory Visit: Payer: Medicaid Other | Attending: Cardiovascular Disease | Admitting: Pediatrics

## 2010-05-30 DIAGNOSIS — Z954 Presence of other heart-valve replacement: Secondary | ICD-10-CM | POA: Insufficient documentation

## 2010-05-30 DIAGNOSIS — I495 Sick sinus syndrome: Secondary | ICD-10-CM

## 2010-05-30 DIAGNOSIS — Z95810 Presence of automatic (implantable) cardiac defibrillator: Secondary | ICD-10-CM

## 2010-05-30 DIAGNOSIS — F17200 Nicotine dependence, unspecified, uncomplicated: Secondary | ICD-10-CM | POA: Insufficient documentation

## 2010-05-30 DIAGNOSIS — Q263 Partial anomalous pulmonary venous connection: Secondary | ICD-10-CM | POA: Insufficient documentation

## 2010-05-30 DIAGNOSIS — Q249 Congenital malformation of heart, unspecified: Secondary | ICD-10-CM

## 2010-06-04 ENCOUNTER — Ambulatory Visit: Payer: Medicaid Other | Attending: Pharmacotherapy

## 2010-06-04 DIAGNOSIS — Z954 Presence of other heart-valve replacement: Secondary | ICD-10-CM | POA: Insufficient documentation

## 2010-06-04 DIAGNOSIS — Z7901 Long term (current) use of anticoagulants: Secondary | ICD-10-CM | POA: Insufficient documentation

## 2010-06-04 DIAGNOSIS — Z5181 Encounter for therapeutic drug level monitoring: Secondary | ICD-10-CM | POA: Insufficient documentation

## 2010-07-02 ENCOUNTER — Ambulatory Visit: Admit: 2010-07-02 | Discharge: 2010-07-02 | Disposition: A | Discharge status: Home / Self Care | Payer: Medicaid Other

## 2010-07-02 ENCOUNTER — Telehealth (HOSPITAL_BASED_OUTPATIENT_CLINIC_OR_DEPARTMENT_OTHER): Payer: Self-pay

## 2010-07-03 ENCOUNTER — Ambulatory Visit
Admit: 2010-07-03 | Discharge: 2010-07-03 | Disposition: A | Discharge status: Home / Self Care | Payer: Medicaid Other | Attending: Pharmacotherapy | Admitting: Pharmacotherapy

## 2010-07-03 DIAGNOSIS — Z7901 Long term (current) use of anticoagulants: Secondary | ICD-10-CM | POA: Insufficient documentation

## 2010-07-03 DIAGNOSIS — Z954 Presence of other heart-valve replacement: Secondary | ICD-10-CM | POA: Insufficient documentation

## 2010-07-03 DIAGNOSIS — Z5181 Encounter for therapeutic drug level monitoring: Secondary | ICD-10-CM | POA: Insufficient documentation

## 2010-07-26 ENCOUNTER — Ambulatory Visit
Admit: 2010-07-26 | Discharge: 2010-07-26 | Disposition: A | Discharge status: Home / Self Care | Payer: Medicaid Other | Attending: Pharmacotherapy | Admitting: Pharmacotherapy

## 2010-07-26 DIAGNOSIS — Z7901 Long term (current) use of anticoagulants: Secondary | ICD-10-CM | POA: Insufficient documentation

## 2010-07-26 DIAGNOSIS — Z5181 Encounter for therapeutic drug level monitoring: Secondary | ICD-10-CM | POA: Insufficient documentation

## 2010-07-26 DIAGNOSIS — Z954 Presence of other heart-valve replacement: Secondary | ICD-10-CM | POA: Insufficient documentation

## 2010-08-28 ENCOUNTER — Ambulatory Visit: Payer: Medicaid Other | Attending: Pharmacotherapy

## 2010-08-28 DIAGNOSIS — Z7901 Long term (current) use of anticoagulants: Secondary | ICD-10-CM | POA: Insufficient documentation

## 2010-08-28 DIAGNOSIS — Z5181 Encounter for therapeutic drug level monitoring: Secondary | ICD-10-CM | POA: Insufficient documentation

## 2010-08-28 DIAGNOSIS — Z954 Presence of other heart-valve replacement: Secondary | ICD-10-CM | POA: Insufficient documentation

## 2010-09-25 ENCOUNTER — Ambulatory Visit: Payer: Medicaid Other | Attending: Pharmacotherapy

## 2010-09-25 DIAGNOSIS — Z7901 Long term (current) use of anticoagulants: Secondary | ICD-10-CM | POA: Insufficient documentation

## 2010-09-25 DIAGNOSIS — Z5181 Encounter for therapeutic drug level monitoring: Secondary | ICD-10-CM | POA: Insufficient documentation

## 2010-09-25 DIAGNOSIS — Z954 Presence of other heart-valve replacement: Secondary | ICD-10-CM | POA: Insufficient documentation

## 2010-10-03 ENCOUNTER — Ambulatory Visit: Payer: Medicaid Other | Attending: Pharmacotherapy

## 2010-10-03 DIAGNOSIS — Z5181 Encounter for therapeutic drug level monitoring: Secondary | ICD-10-CM | POA: Insufficient documentation

## 2010-10-03 DIAGNOSIS — Z954 Presence of other heart-valve replacement: Secondary | ICD-10-CM | POA: Insufficient documentation

## 2010-10-03 DIAGNOSIS — Z7901 Long term (current) use of anticoagulants: Secondary | ICD-10-CM | POA: Insufficient documentation

## 2010-10-10 ENCOUNTER — Ambulatory Visit: Payer: Medicaid Other | Attending: Pharmacotherapy

## 2010-10-10 DIAGNOSIS — Z954 Presence of other heart-valve replacement: Secondary | ICD-10-CM | POA: Insufficient documentation

## 2010-10-10 DIAGNOSIS — Z5181 Encounter for therapeutic drug level monitoring: Secondary | ICD-10-CM | POA: Insufficient documentation

## 2010-10-10 DIAGNOSIS — Z7901 Long term (current) use of anticoagulants: Secondary | ICD-10-CM | POA: Insufficient documentation

## 2010-10-17 ENCOUNTER — Ambulatory Visit: Payer: Medicaid Other | Attending: Pharmacotherapy

## 2010-10-17 DIAGNOSIS — Z7901 Long term (current) use of anticoagulants: Secondary | ICD-10-CM | POA: Insufficient documentation

## 2010-10-17 DIAGNOSIS — Z5181 Encounter for therapeutic drug level monitoring: Secondary | ICD-10-CM | POA: Insufficient documentation

## 2010-10-17 DIAGNOSIS — Z954 Presence of other heart-valve replacement: Secondary | ICD-10-CM | POA: Insufficient documentation

## 2010-10-31 ENCOUNTER — Ambulatory Visit: Payer: Medicaid Other | Attending: Pharmacotherapy

## 2010-10-31 DIAGNOSIS — Z954 Presence of other heart-valve replacement: Secondary | ICD-10-CM | POA: Insufficient documentation

## 2010-10-31 DIAGNOSIS — Z5181 Encounter for therapeutic drug level monitoring: Secondary | ICD-10-CM | POA: Insufficient documentation

## 2010-10-31 DIAGNOSIS — Z7901 Long term (current) use of anticoagulants: Secondary | ICD-10-CM | POA: Insufficient documentation

## 2010-11-07 ENCOUNTER — Ambulatory Visit: Payer: Medicaid Other | Attending: Pharmacotherapy

## 2010-11-07 DIAGNOSIS — Z954 Presence of other heart-valve replacement: Secondary | ICD-10-CM | POA: Insufficient documentation

## 2010-11-07 DIAGNOSIS — Z7901 Long term (current) use of anticoagulants: Secondary | ICD-10-CM | POA: Insufficient documentation

## 2010-11-07 DIAGNOSIS — Z5181 Encounter for therapeutic drug level monitoring: Secondary | ICD-10-CM | POA: Insufficient documentation

## 2010-12-02 ENCOUNTER — Ambulatory Visit: Payer: Medicaid Other | Attending: Registered Nurse | Admitting: Registered Nurse

## 2010-12-02 ENCOUNTER — Other Ambulatory Visit (HOSPITAL_BASED_OUTPATIENT_CLINIC_OR_DEPARTMENT_OTHER): Payer: Self-pay | Admitting: Pharmacotherapy

## 2010-12-02 DIAGNOSIS — I424 Endocardial fibroelastosis: Secondary | ICD-10-CM | POA: Insufficient documentation

## 2010-12-02 DIAGNOSIS — Z954 Presence of other heart-valve replacement: Secondary | ICD-10-CM | POA: Insufficient documentation

## 2010-12-02 DIAGNOSIS — Z5181 Encounter for therapeutic drug level monitoring: Secondary | ICD-10-CM | POA: Insufficient documentation

## 2010-12-02 DIAGNOSIS — I495 Sick sinus syndrome: Secondary | ICD-10-CM | POA: Insufficient documentation

## 2010-12-02 DIAGNOSIS — Z7901 Long term (current) use of anticoagulants: Secondary | ICD-10-CM | POA: Insufficient documentation

## 2010-12-02 DIAGNOSIS — Z950 Presence of cardiac pacemaker: Secondary | ICD-10-CM | POA: Insufficient documentation

## 2010-12-02 LAB — PROTHROMBIN TIME
Prothrombin INR: 2.1 — ABNORMAL HIGH (ref 0.8–1.3)
Prothrombin Time Patient: 22.2 s — ABNORMAL HIGH (ref 10.7–15.6)

## 2010-12-03 ENCOUNTER — Ambulatory Visit: Payer: Medicaid Other

## 2010-12-16 ENCOUNTER — Ambulatory Visit: Payer: Medicaid Other | Attending: Cardiovascular Disease

## 2010-12-16 ENCOUNTER — Other Ambulatory Visit (HOSPITAL_BASED_OUTPATIENT_CLINIC_OR_DEPARTMENT_OTHER): Payer: Self-pay | Admitting: Pharmacotherapy

## 2010-12-16 DIAGNOSIS — Z5181 Encounter for therapeutic drug level monitoring: Secondary | ICD-10-CM | POA: Insufficient documentation

## 2010-12-16 DIAGNOSIS — Z7901 Long term (current) use of anticoagulants: Secondary | ICD-10-CM | POA: Insufficient documentation

## 2010-12-16 DIAGNOSIS — Z954 Presence of other heart-valve replacement: Secondary | ICD-10-CM | POA: Insufficient documentation

## 2010-12-16 LAB — PROTHROMBIN TIME
Prothrombin INR: 3.9 — ABNORMAL HIGH (ref 0.8–1.3)
Prothrombin Time Patient: 35.3 s — ABNORMAL HIGH (ref 10.7–15.6)

## 2010-12-24 ENCOUNTER — Ambulatory Visit: Payer: Medicaid Other | Attending: Pharmacotherapy

## 2010-12-24 ENCOUNTER — Other Ambulatory Visit (HOSPITAL_BASED_OUTPATIENT_CLINIC_OR_DEPARTMENT_OTHER): Payer: Self-pay | Admitting: Pharmacotherapy

## 2010-12-24 ENCOUNTER — Telehealth (HOSPITAL_BASED_OUTPATIENT_CLINIC_OR_DEPARTMENT_OTHER): Payer: Self-pay

## 2010-12-24 DIAGNOSIS — Z954 Presence of other heart-valve replacement: Secondary | ICD-10-CM | POA: Insufficient documentation

## 2010-12-24 DIAGNOSIS — Z7901 Long term (current) use of anticoagulants: Secondary | ICD-10-CM | POA: Insufficient documentation

## 2010-12-24 DIAGNOSIS — Z5181 Encounter for therapeutic drug level monitoring: Secondary | ICD-10-CM | POA: Insufficient documentation

## 2010-12-24 LAB — PROTHROMBIN TIME
Prothrombin INR: 3.4 — ABNORMAL HIGH (ref 0.8–1.3)
Prothrombin Time Patient: 31.7 s — ABNORMAL HIGH (ref 10.7–15.6)

## 2011-01-03 ENCOUNTER — Other Ambulatory Visit (HOSPITAL_BASED_OUTPATIENT_CLINIC_OR_DEPARTMENT_OTHER): Payer: Self-pay | Admitting: Pharmacotherapy

## 2011-01-03 ENCOUNTER — Ambulatory Visit: Payer: Medicaid Other | Attending: Pharmacotherapy

## 2011-01-03 DIAGNOSIS — Z954 Presence of other heart-valve replacement: Secondary | ICD-10-CM | POA: Insufficient documentation

## 2011-01-03 DIAGNOSIS — Z7901 Long term (current) use of anticoagulants: Secondary | ICD-10-CM | POA: Insufficient documentation

## 2011-01-03 DIAGNOSIS — Z5181 Encounter for therapeutic drug level monitoring: Secondary | ICD-10-CM | POA: Insufficient documentation

## 2011-01-03 LAB — PROTHROMBIN TIME
Prothrombin INR: 3.5 — ABNORMAL HIGH (ref 0.8–1.3)
Prothrombin Time Patient: 32.6 s — ABNORMAL HIGH (ref 10.7–15.6)

## 2011-01-16 ENCOUNTER — Encounter (HOSPITAL_BASED_OUTPATIENT_CLINIC_OR_DEPARTMENT_OTHER): Payer: Medicaid Other | Admitting: Family

## 2011-01-17 ENCOUNTER — Other Ambulatory Visit (HOSPITAL_BASED_OUTPATIENT_CLINIC_OR_DEPARTMENT_OTHER): Payer: Self-pay | Admitting: Pharmacotherapy

## 2011-01-17 ENCOUNTER — Ambulatory Visit: Payer: Medicaid Other | Attending: Pharmacotherapy

## 2011-01-17 DIAGNOSIS — Z7901 Long term (current) use of anticoagulants: Secondary | ICD-10-CM | POA: Insufficient documentation

## 2011-01-17 DIAGNOSIS — Z5181 Encounter for therapeutic drug level monitoring: Secondary | ICD-10-CM | POA: Insufficient documentation

## 2011-01-17 DIAGNOSIS — Z954 Presence of other heart-valve replacement: Secondary | ICD-10-CM | POA: Insufficient documentation

## 2011-01-17 LAB — PROTHROMBIN TIME
Prothrombin INR: 3.7 — ABNORMAL HIGH (ref 0.8–1.3)
Prothrombin Time Patient: 33.4 s — ABNORMAL HIGH (ref 10.7–15.6)

## 2011-01-31 ENCOUNTER — Ambulatory Visit: Payer: Medicaid Other | Attending: Pharmacotherapy

## 2011-01-31 ENCOUNTER — Other Ambulatory Visit (HOSPITAL_BASED_OUTPATIENT_CLINIC_OR_DEPARTMENT_OTHER): Payer: Self-pay | Admitting: Pharmacotherapy

## 2011-01-31 DIAGNOSIS — Z7901 Long term (current) use of anticoagulants: Secondary | ICD-10-CM | POA: Insufficient documentation

## 2011-01-31 DIAGNOSIS — Z5181 Encounter for therapeutic drug level monitoring: Secondary | ICD-10-CM | POA: Insufficient documentation

## 2011-01-31 DIAGNOSIS — Z954 Presence of other heart-valve replacement: Secondary | ICD-10-CM | POA: Insufficient documentation

## 2011-01-31 LAB — PROTHROMBIN TIME
Prothrombin INR: 3.7 — ABNORMAL HIGH (ref 0.8–1.3)
Prothrombin Time Patient: 33.4 s — ABNORMAL HIGH (ref 10.7–15.6)

## 2011-02-14 ENCOUNTER — Other Ambulatory Visit (HOSPITAL_BASED_OUTPATIENT_CLINIC_OR_DEPARTMENT_OTHER): Payer: Self-pay | Admitting: Pharmacotherapy

## 2011-02-14 ENCOUNTER — Ambulatory Visit: Payer: Medicaid Other | Attending: Pharmacotherapy

## 2011-02-14 DIAGNOSIS — Z7901 Long term (current) use of anticoagulants: Secondary | ICD-10-CM | POA: Insufficient documentation

## 2011-02-14 DIAGNOSIS — Z954 Presence of other heart-valve replacement: Secondary | ICD-10-CM | POA: Insufficient documentation

## 2011-02-14 DIAGNOSIS — Z5181 Encounter for therapeutic drug level monitoring: Secondary | ICD-10-CM | POA: Insufficient documentation

## 2011-02-14 LAB — PROTHROMBIN TIME
Prothrombin INR: 4.7 — ABNORMAL HIGH (ref 0.8–1.3)
Prothrombin Time Patient: 40.2 s — ABNORMAL HIGH (ref 10.7–15.6)

## 2011-02-21 ENCOUNTER — Ambulatory Visit: Payer: Medicaid Other | Attending: Pharmacotherapy

## 2011-02-21 ENCOUNTER — Other Ambulatory Visit (HOSPITAL_BASED_OUTPATIENT_CLINIC_OR_DEPARTMENT_OTHER): Payer: Self-pay | Admitting: Pharmacotherapy

## 2011-02-21 DIAGNOSIS — Z7901 Long term (current) use of anticoagulants: Secondary | ICD-10-CM | POA: Insufficient documentation

## 2011-02-21 DIAGNOSIS — Z5181 Encounter for therapeutic drug level monitoring: Secondary | ICD-10-CM | POA: Insufficient documentation

## 2011-02-21 DIAGNOSIS — Z954 Presence of other heart-valve replacement: Secondary | ICD-10-CM | POA: Insufficient documentation

## 2011-02-21 LAB — PROTHROMBIN TIME
Prothrombin INR: 2.7 — ABNORMAL HIGH (ref 0.8–1.3)
Prothrombin Time Patient: 26.3 s — ABNORMAL HIGH (ref 10.7–15.6)

## 2011-03-07 ENCOUNTER — Ambulatory Visit: Payer: Medicaid Other | Attending: Pharmacotherapy

## 2011-03-07 ENCOUNTER — Other Ambulatory Visit (HOSPITAL_BASED_OUTPATIENT_CLINIC_OR_DEPARTMENT_OTHER): Payer: Self-pay | Admitting: Pharmacotherapy

## 2011-03-07 DIAGNOSIS — Z5181 Encounter for therapeutic drug level monitoring: Secondary | ICD-10-CM | POA: Insufficient documentation

## 2011-03-07 DIAGNOSIS — Z954 Presence of other heart-valve replacement: Secondary | ICD-10-CM | POA: Insufficient documentation

## 2011-03-07 DIAGNOSIS — Z7901 Long term (current) use of anticoagulants: Secondary | ICD-10-CM | POA: Insufficient documentation

## 2011-03-07 LAB — PROTHROMBIN TIME
Prothrombin INR: 3.1 — ABNORMAL HIGH (ref 0.8–1.3)
Prothrombin Time Patient: 29.6 s — ABNORMAL HIGH (ref 10.7–15.6)

## 2011-03-21 ENCOUNTER — Ambulatory Visit: Payer: Medicaid Other | Attending: Pharmacotherapy

## 2011-03-21 ENCOUNTER — Other Ambulatory Visit (HOSPITAL_BASED_OUTPATIENT_CLINIC_OR_DEPARTMENT_OTHER): Payer: Self-pay | Admitting: Pharmacotherapy

## 2011-03-21 DIAGNOSIS — Z5181 Encounter for therapeutic drug level monitoring: Secondary | ICD-10-CM | POA: Insufficient documentation

## 2011-03-21 DIAGNOSIS — Z954 Presence of other heart-valve replacement: Secondary | ICD-10-CM | POA: Insufficient documentation

## 2011-03-21 DIAGNOSIS — Z7901 Long term (current) use of anticoagulants: Secondary | ICD-10-CM | POA: Insufficient documentation

## 2011-03-21 LAB — PROTHROMBIN TIME
Prothrombin INR: 2.5 — ABNORMAL HIGH (ref 0.8–1.3)
Prothrombin Time Patient: 25.1 s — ABNORMAL HIGH (ref 10.7–15.6)

## 2011-04-04 ENCOUNTER — Other Ambulatory Visit (HOSPITAL_BASED_OUTPATIENT_CLINIC_OR_DEPARTMENT_OTHER): Payer: Self-pay | Admitting: Pharmacotherapy

## 2011-04-04 ENCOUNTER — Telehealth (HOSPITAL_BASED_OUTPATIENT_CLINIC_OR_DEPARTMENT_OTHER): Payer: Self-pay

## 2011-04-04 ENCOUNTER — Ambulatory Visit: Payer: Medicaid Other | Attending: Pharmacotherapy

## 2011-04-04 DIAGNOSIS — Z7901 Long term (current) use of anticoagulants: Secondary | ICD-10-CM | POA: Insufficient documentation

## 2011-04-04 DIAGNOSIS — Z954 Presence of other heart-valve replacement: Secondary | ICD-10-CM | POA: Insufficient documentation

## 2011-04-04 DIAGNOSIS — Z5181 Encounter for therapeutic drug level monitoring: Secondary | ICD-10-CM | POA: Insufficient documentation

## 2011-04-04 LAB — PROTHROMBIN TIME
Prothrombin INR: 2.7 — ABNORMAL HIGH (ref 0.8–1.3)
Prothrombin Time Patient: 26.4 s — ABNORMAL HIGH (ref 10.7–15.6)

## 2011-04-25 ENCOUNTER — Other Ambulatory Visit (HOSPITAL_BASED_OUTPATIENT_CLINIC_OR_DEPARTMENT_OTHER): Payer: Self-pay | Admitting: Pharmacotherapy

## 2011-04-25 ENCOUNTER — Ambulatory Visit: Payer: Medicaid Other | Attending: Pharmacotherapy

## 2011-04-25 DIAGNOSIS — Z7901 Long term (current) use of anticoagulants: Secondary | ICD-10-CM | POA: Insufficient documentation

## 2011-04-25 DIAGNOSIS — Z5181 Encounter for therapeutic drug level monitoring: Secondary | ICD-10-CM | POA: Insufficient documentation

## 2011-04-25 DIAGNOSIS — Z954 Presence of other heart-valve replacement: Secondary | ICD-10-CM | POA: Insufficient documentation

## 2011-04-25 LAB — PROTHROMBIN TIME
Prothrombin INR: 2.6 — ABNORMAL HIGH (ref 0.8–1.3)
Prothrombin Time Patient: 26.2 s — ABNORMAL HIGH (ref 10.7–15.6)

## 2011-04-30 ENCOUNTER — Other Ambulatory Visit (EMERGENCY_DEPARTMENT_HOSPITAL): Payer: Self-pay | Admitting: Emergency Medicine

## 2011-04-30 ENCOUNTER — Emergency Department
Admission: EM | Admit: 2011-04-30 | Discharge: 2011-04-30 | Disposition: A | Discharge status: Home / Self Care | Payer: Medicaid Other | Attending: Emergency Medicine | Admitting: Emergency Medicine

## 2011-04-30 DIAGNOSIS — Z8673 Personal history of transient ischemic attack (TIA), and cerebral infarction without residual deficits: Secondary | ICD-10-CM | POA: Insufficient documentation

## 2011-04-30 LAB — COMPREHENSIVE METABOLIC PANEL
ALT (GPT): 18 U/L (ref 10–64)
AST (GOT): 25 U/L (ref 15–40)
Albumin: 3.9 g/dL (ref 3.5–5.2)
Alkaline Phosphatase (Total): 49 U/L (ref 42–136)
Anion Gap: 7 (ref 3–11)
Bilirubin (Total): 1 mg/dL (ref 0.2–1.3)
Calcium: 9.1 mg/dL (ref 8.9–10.2)
Carbon Dioxide, Total: 26 mEq/L (ref 22–32)
Chloride: 105 mEq/L (ref 98–108)
Creatinine: 0.99 mg/dL (ref 0.51–1.18)
GFR, Calc, African American: >60 mL/min (ref >59)
GFR, Calc, European American: >60 mL/min (ref >59)
Glucose: 97 mg/dL (ref 62–125)
Potassium: 4 mEq/L (ref 3.7–5.2)
Protein (Total): 6.5 g/dL (ref 6.0–8.2)
Sodium: 138 mEq/L (ref 136–145)
Urea Nitrogen: 8 mg/dL (ref 8–21)

## 2011-04-30 LAB — CBC, DIFF
% Basophils: 0 % (ref 0–1)
% Eosinophils: 1 % (ref 0–7)
% Immature Granulocytes: 0 % (ref 0–1)
% Lymphocytes: 22 % (ref 19–53)
% Monocytes: 6 % (ref 5–13)
% Neutrophils: 71 % (ref 34–71)
Absolute Eosinophil Count: 0.05 10*3/uL (ref 0.00–0.50)
Absolute Lymphocyte Count: 1.59 10*3/uL (ref 1.00–4.80)
Basophils: 0.03 10*3/uL (ref 0.00–0.20)
Hematocrit: 40 % (ref 38–50)
Hemoglobin: 13.7 g/dL (ref 13.0–18.0)
Immature Granulocytes: 0.02 10*3/uL (ref 0.00–0.05)
MCH: 32.2 pg (ref 27.3–33.6)
MCHC: 34.5 g/dL (ref 32.2–36.5)
MCV: 93 fL (ref 81–98)
Monocytes: 0.45 10*3/uL (ref 0.00–0.80)
Neutrophils: 5.25 10*3/uL (ref 1.80–7.00)
Platelet Count: 202 10*3/uL (ref 150–400)
RBC: 4.26 mil/uL — ABNORMAL LOW (ref 4.40–5.60)
RDW-CV: 13 % (ref 11.6–14.4)
WBC: 7.39 10*3/uL (ref 4.3–10.0)

## 2011-04-30 LAB — PROTHROMBIN & PTT
Partial Thromboplastin Time: 39 s — ABNORMAL HIGH (ref 22–35)
Prothrombin INR: 3.9 — ABNORMAL HIGH (ref 0.8–1.3)
Prothrombin Time Patient: 34.8 s — ABNORMAL HIGH (ref 10.7–15.6)

## 2011-04-30 LAB — 1ST EXTRA RED TOP

## 2011-05-16 ENCOUNTER — Other Ambulatory Visit (HOSPITAL_BASED_OUTPATIENT_CLINIC_OR_DEPARTMENT_OTHER): Payer: Self-pay | Admitting: Pharmacotherapy

## 2011-05-16 ENCOUNTER — Ambulatory Visit: Payer: Medicaid Other | Attending: Pharmacotherapy

## 2011-05-16 DIAGNOSIS — Z5181 Encounter for therapeutic drug level monitoring: Secondary | ICD-10-CM | POA: Insufficient documentation

## 2011-05-16 DIAGNOSIS — Z954 Presence of other heart-valve replacement: Secondary | ICD-10-CM | POA: Insufficient documentation

## 2011-05-16 DIAGNOSIS — Z7901 Long term (current) use of anticoagulants: Secondary | ICD-10-CM | POA: Insufficient documentation

## 2011-05-16 LAB — PROTHROMBIN TIME
Prothrombin INR: 3.4 — ABNORMAL HIGH (ref 0.8–1.3)
Prothrombin Time Patient: 31.5 s — ABNORMAL HIGH (ref 10.7–15.6)

## 2011-06-06 ENCOUNTER — Other Ambulatory Visit (HOSPITAL_BASED_OUTPATIENT_CLINIC_OR_DEPARTMENT_OTHER): Payer: Self-pay | Admitting: Pharmacotherapy

## 2011-06-06 ENCOUNTER — Ambulatory Visit: Payer: Medicaid Other | Attending: Pharmacotherapy

## 2011-06-06 DIAGNOSIS — Z954 Presence of other heart-valve replacement: Secondary | ICD-10-CM | POA: Insufficient documentation

## 2011-06-06 DIAGNOSIS — Z5181 Encounter for therapeutic drug level monitoring: Secondary | ICD-10-CM | POA: Insufficient documentation

## 2011-06-06 DIAGNOSIS — Z7901 Long term (current) use of anticoagulants: Secondary | ICD-10-CM | POA: Insufficient documentation

## 2011-06-06 LAB — PROTHROMBIN TIME
Prothrombin INR: 1.9 — ABNORMAL HIGH (ref 0.8–1.3)
Prothrombin Time Patient: 20.1 s — ABNORMAL HIGH (ref 10.7–15.6)

## 2011-06-12 ENCOUNTER — Ambulatory Visit: Payer: Medicaid Other | Attending: Cardiovascular Disease | Admitting: Pediatrics

## 2011-06-12 ENCOUNTER — Other Ambulatory Visit (HOSPITAL_BASED_OUTPATIENT_CLINIC_OR_DEPARTMENT_OTHER): Payer: Self-pay | Admitting: Pharmacotherapy

## 2011-06-12 ENCOUNTER — Ambulatory Visit (HOSPITAL_BASED_OUTPATIENT_CLINIC_OR_DEPARTMENT_OTHER): Payer: Medicaid Other

## 2011-06-12 ENCOUNTER — Ambulatory Visit (HOSPITAL_BASED_OUTPATIENT_CLINIC_OR_DEPARTMENT_OTHER): Payer: Medicaid Other | Admitting: Family

## 2011-06-12 DIAGNOSIS — I495 Sick sinus syndrome: Secondary | ICD-10-CM

## 2011-06-12 DIAGNOSIS — Z5181 Encounter for therapeutic drug level monitoring: Secondary | ICD-10-CM | POA: Insufficient documentation

## 2011-06-12 DIAGNOSIS — Q268 Other congenital malformations of great veins: Secondary | ICD-10-CM | POA: Insufficient documentation

## 2011-06-12 DIAGNOSIS — Z954 Presence of other heart-valve replacement: Secondary | ICD-10-CM | POA: Insufficient documentation

## 2011-06-12 DIAGNOSIS — Z7901 Long term (current) use of anticoagulants: Secondary | ICD-10-CM | POA: Insufficient documentation

## 2011-06-12 DIAGNOSIS — I442 Atrioventricular block, complete: Secondary | ICD-10-CM | POA: Insufficient documentation

## 2011-06-12 DIAGNOSIS — Z950 Presence of cardiac pacemaker: Secondary | ICD-10-CM

## 2011-06-12 DIAGNOSIS — Z45018 Encounter for adjustment and management of other part of cardiac pacemaker: Secondary | ICD-10-CM | POA: Insufficient documentation

## 2011-06-12 DIAGNOSIS — Z8774 Personal history of (corrected) congenital malformations of heart and circulatory system: Secondary | ICD-10-CM | POA: Insufficient documentation

## 2011-06-12 DIAGNOSIS — Q212 Atrioventricular septal defect, unspecified as to partial or complete: Secondary | ICD-10-CM

## 2011-06-12 DIAGNOSIS — Q249 Congenital malformation of heart, unspecified: Secondary | ICD-10-CM

## 2011-06-12 DIAGNOSIS — Z8673 Personal history of transient ischemic attack (TIA), and cerebral infarction without residual deficits: Secondary | ICD-10-CM | POA: Insufficient documentation

## 2011-06-12 LAB — PROTHROMBIN TIME
Prothrombin INR: 4.2 — ABNORMAL HIGH (ref 0.8–1.3)
Prothrombin Time Patient: 37.2 s — ABNORMAL HIGH (ref 10.7–15.6)

## 2011-06-13 ENCOUNTER — Ambulatory Visit: Payer: Medicaid Other | Attending: Internal Medicine | Admitting: Internal Medicine

## 2011-06-13 VITALS — BP 112/72 | HR 68 | Ht 75.0 in | Wt 167.0 lb

## 2011-06-13 DIAGNOSIS — S93409A Sprain of unspecified ligament of unspecified ankle, initial encounter: Secondary | ICD-10-CM | POA: Insufficient documentation

## 2011-06-13 DIAGNOSIS — M25579 Pain in unspecified ankle and joints of unspecified foot: Secondary | ICD-10-CM

## 2011-06-13 MED ORDER — ACETAMINOPHEN 500 MG OR TABS
ORAL_TABLET | ORAL | Status: AC
Start: 2011-06-13 — End: ?

## 2011-06-13 NOTE — Progress Notes (Signed)
[facility]    Chief Complaint:  R ankle pain    Mr. 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    Issues Discussed:     1.  Ankle Pain:  Five days ago, pt rolled his ankle while walking down a flight of stairs.   He heard a loud popping sound and needed to rest for the remainder of the day.  The next few days, he was able to ambulate using his niece's crutches at home but could only bear weight on the toes of his right foot.  He described his pain as a throbbing sensation around the lateral part of his ankle that did not radiate to his feet or leg.  The pain would only occur during weight bearing or palpation, ranging a 5-8/10.  His sister noticed some bluish-bruising but he denied any bleeding, swelling, numbness, tingling, or weakness.  He would take tylenol 2x day for the pain.  Yesterday, while at his cardiology visit he was given some ice and an ACE bandage.  His aunt also loaned him her boot which made ambulating much easier.  Pt missed work all week and is concerned about when he can return.  His job requires him to be on his feet constantly and has some heavy lifting involved.  His manager and HR are aware of his health problems and we will provide a medical note for his work.      2.  Heart Issues:  Pt had a recent cardiology visit on 06/12/11.  His INR was increased at 4.2 so his coumadin was decreased to 2.5 mg daily.  The echo performed at this appt was stable and he was referred to [place] for ankle pain and establishing care.  Due to a recent episode of unilateral vision blurriness, he was prescribed ASA 81 mg PO qday.  Pt has had difficulty in the past with medication compliance.  We discussed the purpose and significance of taking the ASA during this visit. However it does appear that he is motivated to continue his coumadin.      3.  Current Life Stressors:   Pt was in a 3.5 year relationship five years ago with a male partner.  He dropped out of school for her and eventually moved in with her parents.  His medication compliance was poor and suffered three CVA's.  They had a daughter together named Victory Dakin and she is currently 75 years old.  One year ago, his ex-girlfriend moved to [place] without his knowledge and took [place] with her.  He has not been able to visit his daughter and is currently fighting for visitation rights.        Current Outpatient Medications:    Warfarin      2.5 mg     PO    qday         Managed by [facility] anticoagulation clinic  ASA             81 mg      PO    qday    Allergies:    Sulfa antibiotics- rash    Problem List:      1.   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   s/p repair with ventricular septal defect closure, February, 1989.  b.   Mitral regurgitation resulting in mechanical mitral valve replacement, 27 mm St. Jude prosthetic valve, January 23, 1992.    c.   Coronary fistula, right coronary coil embolized.      2.   CVAs x 3, 2005, 2006 and 2008. The 2008 CVA required thrombotic therapy. Strokes were related to nonadherence with warfarin therapy.      d.   Poor short-term memory    e.   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   Sinus node dysfunction      f.   s/p Permanent pacemaker, Medtronic      4.   Transient post-operative CHB - now recovered.    5.   Ankle Injury       ROS:  Pertinent positives and negatives reviewed in Issues Discussed.      Physical Examination:     Filed Vitals:    06/13/11 0850   BP: 112/72   Pulse: 68   Height: 6\' 3"  (1.905 m)   Weight: 167 lb (75.751 kg)     Gen:  Pleasant, clean young man in no acute distress.     CV:  RRR, 2-3/6 systolic murmur best heard at left upper sternal border, nl S1/S2, no S3/S4  Ext:  R ankle is slightly swollen compared to L without bleeding or bruising, ROM limited due to pain, no warmth or erythema, pedal pulses intact b/l, feet  are wwp, moving all ext spontaneously   Skin:  12 in well healed sternal scar from cardiac surgeries  Diffusely tender without point tenderness  Neuro:  AOx3, CN2-12 intact, motor strength 5/5 b/l, sensation intact, no focal deficits  Slight cognitive deficits      Assessment:    1.  Ankle Pain:  His ankle pain is most likely due to a grade 2 sprain from his fall.  The joint is not warm or significantly swollen making it less likely to be septic and needing aspiration.  He is able to bear weight on his ankle and slowly move his ankle in all directions making a fracture less likely.  Based on the [place] ankle rules X-ray is not indicated at this time.  He had no pain in his achilles tendon making tendonitis or tendon rupture unlikely.  Also he is a young, relatively healthy male so any rheumatological causes such as OA or gout are low on my ddx.      2.  Cardiac issues:  Pt does not like to discuss his congenital heart defects.  He laughs when reporting his previous three CVAs and has had difficulty in the past taking his medications.  His mother passed away three years ago and his aunt has been his main supporter.  She attends appointments with him and tries to help him as much as she can.  Pt needs to continue being counseled about medication compliance and smoking cessation.      3.  Life Stressors:  Pt does not appear to be depressed.  He has a strong support system and is performing well at work.            Plan:     1.  Ankle Pain:     -ambulatory surgical boot   -RICE (rest, ice 20 min TID, compression with ACE bandage, elevate)   -daily ankle exercises (roll ankle in a circle, draw alphabet with ankle, balance on one foot)   -Acetaminophen 1000mg   PO  TID  prn pain               -  Return to work after 1 week or when he is able to amblulate comfortably in CAM boot   -Follow-up in 1 month to discuss ankle and neuropsych testing/memory    2.  Cardiac issues:     -continue warfarin   -start ASA 81 mg PO  qday    3.  Life stressors:  No further plan at this time.  Consider counseling in the future.        Simranjit Pandora Leiter, MS-3    This visit lasted 30 minutes, of which 25 minutes were spent in counseling about ankle sprain.

## 2011-06-13 NOTE — Patient Instructions (Signed)
Elevate ankle when not out and about    Ice ankle 15- 20 minutes 3 times a day    Wear ace wrap and boot during the day    Tylenol 1000 mg 3 times a day for pain

## 2011-06-16 DIAGNOSIS — S93409A Sprain of unspecified ligament of unspecified ankle, initial encounter: Secondary | ICD-10-CM | POA: Insufficient documentation

## 2011-06-16 NOTE — Progress Notes (Signed)
-------------------------------------------    Attending: Hulan Fess, MD  This visit lasted 30 minutes, of which 25 minutes were spent in counseling about ankle sprain.    -------------------------------------------\

## 2011-06-20 ENCOUNTER — Other Ambulatory Visit (HOSPITAL_BASED_OUTPATIENT_CLINIC_OR_DEPARTMENT_OTHER): Payer: Self-pay | Admitting: Pharmacotherapy

## 2011-06-20 ENCOUNTER — Ambulatory Visit: Payer: Medicaid Other | Attending: Pharmacotherapy

## 2011-06-20 DIAGNOSIS — Z954 Presence of other heart-valve replacement: Secondary | ICD-10-CM | POA: Insufficient documentation

## 2011-06-20 DIAGNOSIS — Z5181 Encounter for therapeutic drug level monitoring: Secondary | ICD-10-CM | POA: Insufficient documentation

## 2011-06-20 DIAGNOSIS — Z7901 Long term (current) use of anticoagulants: Secondary | ICD-10-CM | POA: Insufficient documentation

## 2011-06-20 LAB — PROTHROMBIN TIME
Prothrombin INR: 3.2 — ABNORMAL HIGH (ref 0.8–1.3)
Prothrombin Time Patient: 29.9 s — ABNORMAL HIGH (ref 10.7–15.6)

## 2011-07-03 ENCOUNTER — Ambulatory Visit (HOSPITAL_BASED_OUTPATIENT_CLINIC_OR_DEPARTMENT_OTHER): Payer: Medicaid Other

## 2011-07-03 ENCOUNTER — Other Ambulatory Visit (HOSPITAL_BASED_OUTPATIENT_CLINIC_OR_DEPARTMENT_OTHER): Payer: Self-pay | Admitting: Pharmacotherapy

## 2011-07-03 ENCOUNTER — Ambulatory Visit
Admit: 2011-07-03 | Discharge: 2011-07-03 | Disposition: A | Discharge status: Home / Self Care | Payer: Medicaid Other | Attending: Pharmacotherapy | Admitting: Pharmacotherapy

## 2011-07-03 DIAGNOSIS — Z5181 Encounter for therapeutic drug level monitoring: Secondary | ICD-10-CM | POA: Insufficient documentation

## 2011-07-03 DIAGNOSIS — Z7901 Long term (current) use of anticoagulants: Secondary | ICD-10-CM | POA: Insufficient documentation

## 2011-07-03 DIAGNOSIS — Z954 Presence of other heart-valve replacement: Secondary | ICD-10-CM | POA: Insufficient documentation

## 2011-07-03 LAB — PROTHROMBIN TIME
Prothrombin INR: 3.4 — ABNORMAL HIGH (ref 0.8–1.3)
Prothrombin Time Patient: 31.4 s — ABNORMAL HIGH (ref 10.7–15.6)

## 2011-07-04 ENCOUNTER — Telehealth (HOSPITAL_BASED_OUTPATIENT_CLINIC_OR_DEPARTMENT_OTHER): Payer: Self-pay

## 2011-07-17 ENCOUNTER — Other Ambulatory Visit (HOSPITAL_BASED_OUTPATIENT_CLINIC_OR_DEPARTMENT_OTHER): Payer: Self-pay | Admitting: Pharmacotherapy

## 2011-07-17 ENCOUNTER — Ambulatory Visit: Payer: Medicaid Other | Attending: Pharmacotherapy

## 2011-07-17 DIAGNOSIS — Z7901 Long term (current) use of anticoagulants: Secondary | ICD-10-CM | POA: Insufficient documentation

## 2011-07-17 DIAGNOSIS — Z5181 Encounter for therapeutic drug level monitoring: Secondary | ICD-10-CM | POA: Insufficient documentation

## 2011-07-17 DIAGNOSIS — Z954 Presence of other heart-valve replacement: Secondary | ICD-10-CM | POA: Insufficient documentation

## 2011-07-17 LAB — PROTHROMBIN TIME
Prothrombin INR: 2.4 — ABNORMAL HIGH (ref 0.8–1.3)
Prothrombin Time Patient: 24.5 s — ABNORMAL HIGH (ref 10.7–15.6)

## 2011-07-31 ENCOUNTER — Telehealth (HOSPITAL_BASED_OUTPATIENT_CLINIC_OR_DEPARTMENT_OTHER): Payer: Self-pay

## 2011-07-31 ENCOUNTER — Ambulatory Visit
Admit: 2011-07-31 | Discharge: 2011-07-31 | Disposition: A | Discharge status: Home / Self Care | Payer: Medicaid Other | Attending: Pharmacotherapy | Admitting: Pharmacotherapy

## 2011-07-31 ENCOUNTER — Other Ambulatory Visit (HOSPITAL_BASED_OUTPATIENT_CLINIC_OR_DEPARTMENT_OTHER): Payer: Self-pay | Admitting: Pharmacotherapy

## 2011-07-31 DIAGNOSIS — Z7901 Long term (current) use of anticoagulants: Secondary | ICD-10-CM | POA: Insufficient documentation

## 2011-07-31 DIAGNOSIS — Z954 Presence of other heart-valve replacement: Secondary | ICD-10-CM | POA: Insufficient documentation

## 2011-07-31 DIAGNOSIS — Z5181 Encounter for therapeutic drug level monitoring: Secondary | ICD-10-CM | POA: Insufficient documentation

## 2011-07-31 LAB — PROTHROMBIN TIME
Prothrombin INR: 3.4 — ABNORMAL HIGH (ref 0.8–1.3)
Prothrombin Time Patient: 31.7 s — ABNORMAL HIGH (ref 10.7–15.6)

## 2011-08-01 ENCOUNTER — Ambulatory Visit: Payer: Medicaid Other

## 2011-08-14 ENCOUNTER — Ambulatory Visit
Admit: 2011-08-14 | Discharge: 2011-08-14 | Disposition: A | Discharge status: Home / Self Care | Payer: No Typology Code available for payment source | Attending: Pharmacotherapy | Admitting: Pharmacotherapy

## 2011-08-14 ENCOUNTER — Other Ambulatory Visit (HOSPITAL_BASED_OUTPATIENT_CLINIC_OR_DEPARTMENT_OTHER): Payer: Self-pay | Admitting: Pharmacotherapy

## 2011-08-14 ENCOUNTER — Telehealth (HOSPITAL_BASED_OUTPATIENT_CLINIC_OR_DEPARTMENT_OTHER): Payer: Self-pay

## 2011-08-14 DIAGNOSIS — Z7901 Long term (current) use of anticoagulants: Secondary | ICD-10-CM | POA: Insufficient documentation

## 2011-08-14 DIAGNOSIS — Z954 Presence of other heart-valve replacement: Secondary | ICD-10-CM | POA: Insufficient documentation

## 2011-08-14 DIAGNOSIS — Z5181 Encounter for therapeutic drug level monitoring: Secondary | ICD-10-CM | POA: Insufficient documentation

## 2011-08-14 LAB — PROTHROMBIN TIME
Prothrombin INR: 2.9 — ABNORMAL HIGH (ref 0.8–1.3)
Prothrombin Time Patient: 28.1 s — ABNORMAL HIGH (ref 10.7–15.6)

## 2011-08-15 ENCOUNTER — Ambulatory Visit: Payer: No Typology Code available for payment source

## 2011-09-04 ENCOUNTER — Telehealth (HOSPITAL_BASED_OUTPATIENT_CLINIC_OR_DEPARTMENT_OTHER): Payer: Self-pay | Admitting: Internal Medicine

## 2011-09-04 NOTE — Telephone Encounter (Signed)
Message copied by Eula Fried on Thu Sep 04, 2011 10:52 AM  ------       Message from: Rayburn Ma       Created: Thu Sep 04, 2011  8:20 AM       Regarding: [facility] appointment       Contact: [phone]         CONFIRMED PHONE NUMBER: [phone]       CALLERS FIRST AND LAST NAME: Sherryl Barters       FACILITY NAME: n/a TITLE: n/a       CALLERS RELATIONSHIP:OTHER: Aunt, patient's advocate (he's got cardio issues so she helps with appointments, etc)       RETURN CALL: Detailed message on voicemail only              SUBJECT:        REASON FOR REQUEST: Patient's trying to get visiting rights with his daughter but needs a physical to indicate that his health is well enough to tend to her (she's 5). He's hoping to be seen ASAP so they can have her visit for Christmas.              REQUEST APPOINTMENT WITH: Dr Marijean Bravo PROVIDER: n/a       REQUESTED DATE: 09/09/11       REQUESTED TIME: around 4pm ideally.       UNABLE TO APPOINT: Schedule appears full              NOTE - re: patient's insurance is [name] [employer] Blind[employer]Disabled, currently showing in HDX that Dr Marcelle Overlie at [facility] is PCP. Jaelin Fackler called yesterday on 11/28 to have them change PCP to Dr Jimmie Molly, since patient saw her earlier this year. [employer] HO was effective 08/07/2011.

## 2011-09-04 NOTE — Telephone Encounter (Signed)
Walter Rice is taking care of this request per Dr. Glory Rosebush request.

## 2011-09-10 ENCOUNTER — Encounter (HOSPITAL_BASED_OUTPATIENT_CLINIC_OR_DEPARTMENT_OTHER): Payer: Self-pay | Admitting: Internal Medicine

## 2011-09-10 ENCOUNTER — Ambulatory Visit: Payer: No Typology Code available for payment source | Attending: Internal Medicine | Admitting: Internal Medicine

## 2011-09-10 VITALS — BP 110/60 | HR 68 | Wt 169.3 lb

## 2011-09-10 DIAGNOSIS — I69919 Unspecified symptoms and signs involving cognitive functions following unspecified cerebrovascular disease: Secondary | ICD-10-CM | POA: Insufficient documentation

## 2011-09-10 DIAGNOSIS — Z0000 Encounter for general adult medical examination without abnormal findings: Secondary | ICD-10-CM

## 2011-09-10 DIAGNOSIS — Z7901 Long term (current) use of anticoagulants: Secondary | ICD-10-CM | POA: Insufficient documentation

## 2011-09-10 DIAGNOSIS — Z950 Presence of cardiac pacemaker: Secondary | ICD-10-CM | POA: Insufficient documentation

## 2011-09-10 DIAGNOSIS — I639 Cerebral infarction, unspecified: Secondary | ICD-10-CM | POA: Insufficient documentation

## 2011-09-10 DIAGNOSIS — R413 Other amnesia: Secondary | ICD-10-CM | POA: Insufficient documentation

## 2011-09-10 DIAGNOSIS — Z91199 Patient's noncompliance with other medical treatment and regimen due to unspecified reason: Secondary | ICD-10-CM | POA: Insufficient documentation

## 2011-09-10 DIAGNOSIS — I495 Sick sinus syndrome: Secondary | ICD-10-CM | POA: Insufficient documentation

## 2011-09-10 DIAGNOSIS — Z833 Family history of diabetes mellitus: Secondary | ICD-10-CM | POA: Insufficient documentation

## 2011-09-10 NOTE — Patient Instructions (Signed)
Diet:  Fruits, vegetables, lean meats, whole grains    Limit bread, pasta, sweets, sodas, juices    Exercise:  Active daily    Risks for heart:   overweight, diabetes, high blood pressure, smoking, lack of exercise    Safety:  Don't go in car with someone who has been drinking    Exercises for back: 2-3 times a week 3 sets of 10 each time.  Sit ups  Push ups  supermans- hold for 30 seconds each

## 2011-09-10 NOTE — Progress Notes (Signed)
This note was dictated.

## 2011-09-11 ENCOUNTER — Ambulatory Visit
Admit: 2011-09-11 | Discharge: 2011-09-11 | Disposition: A | Discharge status: Home / Self Care | Payer: No Typology Code available for payment source | Attending: Pharmacotherapy | Admitting: Pharmacotherapy

## 2011-09-11 ENCOUNTER — Other Ambulatory Visit (HOSPITAL_BASED_OUTPATIENT_CLINIC_OR_DEPARTMENT_OTHER): Payer: Self-pay | Admitting: Pharmacotherapy

## 2011-09-11 ENCOUNTER — Telehealth (HOSPITAL_BASED_OUTPATIENT_CLINIC_OR_DEPARTMENT_OTHER): Payer: Self-pay

## 2011-09-11 DIAGNOSIS — Z7901 Long term (current) use of anticoagulants: Secondary | ICD-10-CM | POA: Insufficient documentation

## 2011-09-11 DIAGNOSIS — Z5181 Encounter for therapeutic drug level monitoring: Secondary | ICD-10-CM | POA: Insufficient documentation

## 2011-09-11 DIAGNOSIS — Z954 Presence of other heart-valve replacement: Secondary | ICD-10-CM | POA: Insufficient documentation

## 2011-09-11 LAB — HEPATITIS A,B,& C PANEL
Hepatitis A Ab: NONREACTIVE
Hepatitis B Core Ab: NONREACTIVE
Hepatitis B Surf Antibody Intl Units: 2.8 IU
Hepatitis B Surface Ab: NONREACTIVE
Hepatitis B Surface Antigen w/Reflex: NONREACTIVE
Hepatitis C Antibody w/Rflx PCR: NONREACTIVE

## 2011-09-11 LAB — HIV ANTIGEN AND ANTIBODY SCRN
HIV Antigen and Antibody Interpretation: NONREACTIVE
HIV Antigen and Antibody Result: NONREACTIVE

## 2011-09-11 LAB — PROTHROMBIN TIME
Prothrombin INR: 3.6 — ABNORMAL HIGH (ref 0.8–1.3)
Prothrombin Time Patient: 33.1 s — ABNORMAL HIGH (ref 10.7–15.6)

## 2011-09-12 ENCOUNTER — Ambulatory Visit: Payer: No Typology Code available for payment source

## 2011-09-19 ENCOUNTER — Encounter (HOSPITAL_BASED_OUTPATIENT_CLINIC_OR_DEPARTMENT_OTHER): Payer: Self-pay | Admitting: Internal Medicine

## 2011-09-25 ENCOUNTER — Telehealth (HOSPITAL_BASED_OUTPATIENT_CLINIC_OR_DEPARTMENT_OTHER): Payer: Self-pay

## 2011-09-25 ENCOUNTER — Ambulatory Visit
Admit: 2011-09-25 | Discharge: 2011-09-25 | Disposition: A | Discharge status: Home / Self Care | Payer: No Typology Code available for payment source | Attending: Pharmacotherapy | Admitting: Pharmacotherapy

## 2011-09-25 ENCOUNTER — Other Ambulatory Visit (HOSPITAL_BASED_OUTPATIENT_CLINIC_OR_DEPARTMENT_OTHER): Payer: Self-pay | Admitting: Pharmacotherapy

## 2011-09-25 DIAGNOSIS — Z954 Presence of other heart-valve replacement: Secondary | ICD-10-CM | POA: Insufficient documentation

## 2011-09-25 DIAGNOSIS — Z7901 Long term (current) use of anticoagulants: Secondary | ICD-10-CM | POA: Insufficient documentation

## 2011-09-25 DIAGNOSIS — Z5181 Encounter for therapeutic drug level monitoring: Secondary | ICD-10-CM | POA: Insufficient documentation

## 2011-09-25 LAB — PROTHROMBIN TIME
Prothrombin INR: 2.8 — ABNORMAL HIGH (ref 0.8–1.3)
Prothrombin Time Patient: 27.4 s — ABNORMAL HIGH (ref 10.7–15.6)

## 2011-09-29 ENCOUNTER — Ambulatory Visit: Payer: Self-pay

## 2011-10-16 ENCOUNTER — Telehealth (HOSPITAL_BASED_OUTPATIENT_CLINIC_OR_DEPARTMENT_OTHER): Payer: Self-pay

## 2011-10-17 ENCOUNTER — Ambulatory Visit
Admit: 2011-10-17 | Discharge: 2011-10-17 | Disposition: A | Discharge status: Home / Self Care | Payer: No Typology Code available for payment source | Attending: Pharmacotherapy | Admitting: Pharmacotherapy

## 2011-10-17 ENCOUNTER — Other Ambulatory Visit (HOSPITAL_BASED_OUTPATIENT_CLINIC_OR_DEPARTMENT_OTHER): Payer: Self-pay | Admitting: Pharmacotherapy

## 2011-10-17 DIAGNOSIS — Z5181 Encounter for therapeutic drug level monitoring: Secondary | ICD-10-CM | POA: Insufficient documentation

## 2011-10-17 DIAGNOSIS — Z954 Presence of other heart-valve replacement: Secondary | ICD-10-CM | POA: Insufficient documentation

## 2011-10-17 DIAGNOSIS — Z7901 Long term (current) use of anticoagulants: Secondary | ICD-10-CM | POA: Insufficient documentation

## 2011-10-17 LAB — PROTHROMBIN TIME
Prothrombin INR: 2.5 — ABNORMAL HIGH (ref 0.8–1.3)
Prothrombin Time Patient: 25.4 s — ABNORMAL HIGH (ref 10.7–15.6)

## 2011-10-20 ENCOUNTER — Ambulatory Visit: Payer: No Typology Code available for payment source

## 2011-11-10 ENCOUNTER — Telehealth (HOSPITAL_BASED_OUTPATIENT_CLINIC_OR_DEPARTMENT_OTHER): Payer: Self-pay

## 2011-11-28 ENCOUNTER — Ambulatory Visit
Admit: 2011-11-28 | Discharge: 2011-11-28 | Disposition: A | Discharge status: Home / Self Care | Payer: No Typology Code available for payment source | Attending: Pharmacotherapy | Admitting: Pharmacotherapy

## 2011-11-28 ENCOUNTER — Other Ambulatory Visit (HOSPITAL_BASED_OUTPATIENT_CLINIC_OR_DEPARTMENT_OTHER): Payer: Self-pay | Admitting: Pharmacotherapy

## 2011-11-28 DIAGNOSIS — Z5181 Encounter for therapeutic drug level monitoring: Secondary | ICD-10-CM | POA: Insufficient documentation

## 2011-11-28 DIAGNOSIS — Z7901 Long term (current) use of anticoagulants: Secondary | ICD-10-CM | POA: Insufficient documentation

## 2011-11-28 DIAGNOSIS — Z954 Presence of other heart-valve replacement: Secondary | ICD-10-CM | POA: Insufficient documentation

## 2011-11-28 LAB — PROTHROMBIN TIME
Prothrombin INR: 2.5 — ABNORMAL HIGH (ref 0.8–1.3)
Prothrombin Time Patient: 24.8 s — ABNORMAL HIGH (ref 10.7–15.6)

## 2011-12-01 ENCOUNTER — Ambulatory Visit: Payer: No Typology Code available for payment source

## 2011-12-10 ENCOUNTER — Encounter (HOSPITAL_BASED_OUTPATIENT_CLINIC_OR_DEPARTMENT_OTHER): Payer: Self-pay | Admitting: Nurse Practitioner

## 2011-12-25 ENCOUNTER — Telehealth (HOSPITAL_BASED_OUTPATIENT_CLINIC_OR_DEPARTMENT_OTHER): Payer: Self-pay

## 2012-01-07 ENCOUNTER — Other Ambulatory Visit (HOSPITAL_BASED_OUTPATIENT_CLINIC_OR_DEPARTMENT_OTHER): Payer: Self-pay | Admitting: Pharmacotherapy

## 2012-01-07 ENCOUNTER — Ambulatory Visit
Admit: 2012-01-07 | Discharge: 2012-01-07 | Disposition: A | Discharge status: Home / Self Care | Payer: No Typology Code available for payment source | Attending: Pharmacotherapy | Admitting: Pharmacotherapy

## 2012-01-07 DIAGNOSIS — Z7901 Long term (current) use of anticoagulants: Secondary | ICD-10-CM | POA: Insufficient documentation

## 2012-01-07 DIAGNOSIS — Z5181 Encounter for therapeutic drug level monitoring: Secondary | ICD-10-CM | POA: Insufficient documentation

## 2012-01-07 DIAGNOSIS — Z954 Presence of other heart-valve replacement: Secondary | ICD-10-CM | POA: Insufficient documentation

## 2012-01-07 LAB — PROTHROMBIN TIME
Prothrombin INR: 2.9 — ABNORMAL HIGH (ref 0.8–1.3)
Prothrombin Time Patient: 28.1 s — ABNORMAL HIGH (ref 10.7–15.6)

## 2012-01-08 ENCOUNTER — Ambulatory Visit: Payer: No Typology Code available for payment source

## 2012-01-22 ENCOUNTER — Ambulatory Visit (HOSPITAL_BASED_OUTPATIENT_CLINIC_OR_DEPARTMENT_OTHER): Payer: No Typology Code available for payment source

## 2012-01-22 ENCOUNTER — Ambulatory Visit: Payer: No Typology Code available for payment source | Attending: Internal Medicine | Admitting: Hematology & Oncology

## 2012-01-22 ENCOUNTER — Other Ambulatory Visit (HOSPITAL_BASED_OUTPATIENT_CLINIC_OR_DEPARTMENT_OTHER): Payer: Self-pay

## 2012-01-22 VITALS — BP 122/70 | HR 76 | Ht 74.0 in | Wt 164.0 lb

## 2012-01-22 DIAGNOSIS — M542 Cervicalgia: Secondary | ICD-10-CM

## 2012-01-22 LAB — CBC, DIFF
% Basophils: 0 % (ref 0–1)
% Eosinophils: 1 % (ref 0–7)
% Lymphocytes: 28 % (ref 19–53)
% Monocytes: 11 % (ref 5–13)
% Neutrophils: 60 % (ref 34–71)
Absolute Eosinophil Count: 0.09 10*3/uL (ref 0.00–0.50)
Absolute Lymphocyte Count: 2.53 10*3/uL (ref 1.00–4.80)
Basophils: 0 10*3/uL (ref 0.00–0.20)
Hematocrit: 42 % (ref 38–50)
Hemoglobin: 14.8 g/dL (ref 13.0–18.0)
MCH: 32.2 pg (ref 27.3–33.6)
MCHC: 35.1 g/dL (ref 32.2–36.5)
MCV: 92 fL (ref 81–98)
Monocytes: 1 10*3/uL — ABNORMAL HIGH (ref 0.00–0.80)
Neutrophils: 5.43 10*3/uL (ref 1.80–7.00)
Platelet Count: 206 10*3/uL (ref 150–400)
RBC: 4.6 mil/uL (ref 4.40–5.60)
RDW-CV: 12.5 % (ref 11.6–14.4)
WBC: 9.05 10*3/uL (ref 4.3–10.0)

## 2012-01-22 LAB — PROTHROMBIN TIME
Prothrombin Time Patient: 39.2 s — ABNORMAL HIGH (ref 10.7–15.6)
prothrombin INR: 4.5 — ABNORMAL HIGH (ref 0.8–1.3)

## 2012-01-22 NOTE — Progress Notes (Signed)
[facility]     Chief Complaint:  Chief Complaint   Patient presents with   . Neck Pain     Walter Rice is a 26 year old year old English speaking male with congenital heart disease with mechanical MV on warfarin, permanent pacemaker who presents today with neck pain.    Issues Discussed:   Woke up with mild bilateral dull neck pain on Monday morning (4 days ago).  Thought he slept in a weird position.  No preceeding trauma or new exercises.  Pain is bilateral at the neck and radiates down to upper shoulders in V-shape.  No weakness/pain/paresthesias in the arms.  No difficulty with walking.  Neck pain worsened on Tuesday, was most severe yesterday and today.  Not bothered by bright lights. Did have a bilateral throbbing headache yesterday and couldn't sleep at night.  Headache is resolved now.  He does not infrequently get headaches at baseline and episode yesterday was comparable to his usual headaches.  Neck pain is as severe as yesterday.  Neck pain is worse with any movement of his neck.  No fevers, no recent URI symptoms.  Lives alone. No IVDU.       Last INR check 2.9 on 01/07/12.  Previous INRs have been as high as 5.     ROS:  As above     Physical Examination:   BP 122/70  Pulse 76  Ht 6\' 2"  (1.88 m)  Wt 164 lb (74.39 kg)  BMI 21.06 kg/m2  Gen: well-developed, well-appearing young man. Sitting in chair.    HEENT: sclera white. Bilateral TM's normal.  No sinus pain.   Neuro: Pupils equal and reactive to light bilaterally, EOM intact.  Face symmetric.  Uvula/tongue midline. Shoulder shrug intact. Strength in BL UE 5/5 all muscle groups. In the LE, leg flexion is 4+/5 bilaterally.  Gait is normal.  No photophobia with otoscope light or room light.   MSK: ROM of neck is limited to 10 degrees on lateral flexion bilaterally due to pain.  Forward flexion is limited ~50%, pt not able to touch chin to chest.      Assessment/Plan:  I discussed the patient with Walter Rice, attending  physician, for a problem focused visit.    # Neck pain - Less likely to be musculoskeletal in origin based on exam findings and lack of history of trauma.  Concern for viral meningitis or meningeal irritation from subarachnoid bleed, given pt is on anticoagulation.  Lack of photophobia and resolved headache make meningitis less likely.  Less likely epidural abscess given no risk factors and no fever.  If symptoms unresolved or worsen, LP may be indicated to rule out subarachnoid hemorrhage.  If CNS etiology ruled out and symptoms persistent, can consider rheumatologic causes for neck pain/stiffness.         - head and neck CT non-contrast --> spoke to neuroradiology, no signs of acute bleed or other acute process on wet read, although sensitivity of CT for a small subarachnoid bleed is not perfect.    - check INR and CBC... Still pending.    - LP would require bridging with LMWH.    - spoke with neuro consult resident --> Given improvement in headache, they feel infectious meningitis or subarachnoid hemorrhage would be clinically very unlikely.  They raised possibility of new   stroke, but this was not seen on head CT and also no exam findings to suggest this.  Neuro recommends close clinical follow-up, but  feel no LP is indicated at this time given lack of a headache.     - patient did not want to wait to hear back from neurology and left clinic. Stated his neck was feeling a little better.  Called patient back with neurology recommendations.    - advised to call clinic or go to ER immediately if has worsening neck pain, recurrent headache, weakness, or fever/chills.     - Patient prefers to call us in 1 week for update on his symptoms rather than make a follow-up appointment, as it is difficult for him to take time off of work.    - gave him work excuse letter for today and light restrictions x 1 week at work.         INR was 4.5.  I called anticoag clinic and they will call him to adjust his warfarin dose.  Despite elevation in INR I think given the lack of significant headache SAH would still be unlikely.  Plan as above.     -------------------------------------------  Attending: Gershon Mussel, MD  I discussed the history, exam and medical decision making for this patient with Walter Rice. I was present in the clinic during the provisions of these services with no other clinical duties. I concur with the management plan as discussed.  -------------------------------------------

## 2012-01-26 LAB — PR CT SOFT TISSUE NECK W/O CONTRAST MATERIAL

## 2012-01-26 LAB — PR CT HEAD/BRAIN W/O CONTRAST MATERIAL

## 2012-01-29 ENCOUNTER — Telehealth (HOSPITAL_BASED_OUTPATIENT_CLINIC_OR_DEPARTMENT_OTHER): Payer: Self-pay

## 2012-01-30 ENCOUNTER — Telehealth (HOSPITAL_BASED_OUTPATIENT_CLINIC_OR_DEPARTMENT_OTHER): Payer: Self-pay

## 2012-02-04 ENCOUNTER — Telehealth (HOSPITAL_BASED_OUTPATIENT_CLINIC_OR_DEPARTMENT_OTHER): Payer: Self-pay

## 2012-02-06 ENCOUNTER — Telehealth (HOSPITAL_BASED_OUTPATIENT_CLINIC_OR_DEPARTMENT_OTHER): Payer: Self-pay

## 2012-02-06 ENCOUNTER — Ambulatory Visit
Admit: 2012-02-06 | Discharge: 2012-02-06 | Disposition: A | Discharge status: Home / Self Care | Payer: No Typology Code available for payment source | Attending: Pharmacotherapy | Admitting: Pharmacotherapy

## 2012-02-06 ENCOUNTER — Other Ambulatory Visit (HOSPITAL_BASED_OUTPATIENT_CLINIC_OR_DEPARTMENT_OTHER): Payer: Self-pay | Admitting: Pharmacotherapy

## 2012-02-06 DIAGNOSIS — Z5181 Encounter for therapeutic drug level monitoring: Secondary | ICD-10-CM | POA: Insufficient documentation

## 2012-02-06 DIAGNOSIS — Z954 Presence of other heart-valve replacement: Secondary | ICD-10-CM | POA: Insufficient documentation

## 2012-02-06 DIAGNOSIS — Z7901 Long term (current) use of anticoagulants: Secondary | ICD-10-CM | POA: Insufficient documentation

## 2012-02-06 LAB — PROTHROMBIN TIME
Prothrombin INR: 3.1 — ABNORMAL HIGH (ref 0.8–1.3)
Prothrombin Time Patient: 29.2 s — ABNORMAL HIGH (ref 10.7–15.6)

## 2012-02-09 ENCOUNTER — Ambulatory Visit: Payer: No Typology Code available for payment source

## 2012-03-08 ENCOUNTER — Ambulatory Visit (HOSPITAL_BASED_OUTPATIENT_CLINIC_OR_DEPARTMENT_OTHER): Payer: No Typology Code available for payment source

## 2012-03-12 ENCOUNTER — Other Ambulatory Visit (HOSPITAL_BASED_OUTPATIENT_CLINIC_OR_DEPARTMENT_OTHER): Payer: Self-pay | Admitting: Pharmacotherapy

## 2012-03-12 ENCOUNTER — Telehealth (HOSPITAL_BASED_OUTPATIENT_CLINIC_OR_DEPARTMENT_OTHER): Payer: Self-pay

## 2012-03-12 ENCOUNTER — Ambulatory Visit
Admit: 2012-03-12 | Discharge: 2012-03-12 | Disposition: A | Discharge status: Home / Self Care | Payer: No Typology Code available for payment source | Attending: Pharmacotherapy | Admitting: Pharmacotherapy

## 2012-03-12 DIAGNOSIS — Z5181 Encounter for therapeutic drug level monitoring: Secondary | ICD-10-CM | POA: Insufficient documentation

## 2012-03-12 DIAGNOSIS — Z954 Presence of other heart-valve replacement: Secondary | ICD-10-CM | POA: Insufficient documentation

## 2012-03-12 DIAGNOSIS — Z7901 Long term (current) use of anticoagulants: Secondary | ICD-10-CM | POA: Insufficient documentation

## 2012-03-12 LAB — PROTHROMBIN TIME
Prothrombin INR: 3.5 — ABNORMAL HIGH (ref 0.8–1.3)
Prothrombin Time Patient: 32.6 s — ABNORMAL HIGH (ref 10.7–15.6)

## 2012-03-15 ENCOUNTER — Ambulatory Visit: Payer: No Typology Code available for payment source

## 2012-04-12 ENCOUNTER — Telehealth (HOSPITAL_BASED_OUTPATIENT_CLINIC_OR_DEPARTMENT_OTHER): Payer: Self-pay

## 2012-04-16 ENCOUNTER — Ambulatory Visit
Admit: 2012-04-16 | Discharge: 2012-04-16 | Disposition: A | Discharge status: Home / Self Care | Payer: No Typology Code available for payment source | Attending: Pharmacotherapy | Admitting: Pharmacotherapy

## 2012-04-16 ENCOUNTER — Other Ambulatory Visit (HOSPITAL_BASED_OUTPATIENT_CLINIC_OR_DEPARTMENT_OTHER): Payer: Self-pay | Admitting: Pharmacotherapy

## 2012-04-16 DIAGNOSIS — Z954 Presence of other heart-valve replacement: Secondary | ICD-10-CM | POA: Insufficient documentation

## 2012-04-16 DIAGNOSIS — Z5181 Encounter for therapeutic drug level monitoring: Secondary | ICD-10-CM | POA: Insufficient documentation

## 2012-04-16 DIAGNOSIS — Z7901 Long term (current) use of anticoagulants: Secondary | ICD-10-CM | POA: Insufficient documentation

## 2012-04-16 LAB — PROTHROMBIN TIME
Prothrombin INR: 3.8 — ABNORMAL HIGH (ref 0.8–1.3)
Prothrombin Time Patient: 34.1 s — ABNORMAL HIGH (ref 10.7–15.6)

## 2012-04-19 ENCOUNTER — Ambulatory Visit: Payer: No Typology Code available for payment source

## 2012-04-30 ENCOUNTER — Telehealth (HOSPITAL_BASED_OUTPATIENT_CLINIC_OR_DEPARTMENT_OTHER): Payer: Self-pay

## 2012-05-06 ENCOUNTER — Other Ambulatory Visit (HOSPITAL_BASED_OUTPATIENT_CLINIC_OR_DEPARTMENT_OTHER): Payer: Self-pay | Admitting: Pharmacotherapy

## 2012-05-06 ENCOUNTER — Ambulatory Visit
Admit: 2012-05-06 | Discharge: 2012-05-06 | Disposition: A | Discharge status: Home / Self Care | Payer: No Typology Code available for payment source | Attending: Pharmacotherapy | Admitting: Pharmacotherapy

## 2012-05-06 DIAGNOSIS — Z5181 Encounter for therapeutic drug level monitoring: Secondary | ICD-10-CM | POA: Insufficient documentation

## 2012-05-06 DIAGNOSIS — Z7901 Long term (current) use of anticoagulants: Secondary | ICD-10-CM | POA: Insufficient documentation

## 2012-05-06 DIAGNOSIS — Z954 Presence of other heart-valve replacement: Secondary | ICD-10-CM | POA: Insufficient documentation

## 2012-05-06 LAB — PROTHROMBIN TIME
Prothrombin INR: 2.2 — ABNORMAL HIGH (ref 0.8–1.3)
Prothrombin Time Patient: 22.5 s — ABNORMAL HIGH (ref 10.7–15.6)

## 2012-05-07 ENCOUNTER — Ambulatory Visit: Payer: No Typology Code available for payment source

## 2012-05-14 ENCOUNTER — Emergency Department
Admission: EM | Admit: 2012-05-14 | Discharge: 2012-05-14 | Disposition: A | Discharge status: Home / Self Care | Payer: No Typology Code available for payment source | Attending: Emergency Medicine | Admitting: Emergency Medicine

## 2012-05-14 ENCOUNTER — Other Ambulatory Visit (EMERGENCY_DEPARTMENT_HOSPITAL): Payer: Self-pay | Admitting: Emergency Medicine

## 2012-05-14 ENCOUNTER — Other Ambulatory Visit (EMERGENCY_DEPARTMENT_HOSPITAL): Payer: Self-pay

## 2012-05-14 DIAGNOSIS — Z950 Presence of cardiac pacemaker: Secondary | ICD-10-CM | POA: Insufficient documentation

## 2012-05-14 DIAGNOSIS — I6350 Cerebral infarction due to unspecified occlusion or stenosis of unspecified cerebral artery: Secondary | ICD-10-CM

## 2012-05-14 DIAGNOSIS — H538 Other visual disturbances: Secondary | ICD-10-CM

## 2012-05-14 DIAGNOSIS — R4182 Altered mental status, unspecified: Secondary | ICD-10-CM

## 2012-05-14 DIAGNOSIS — Z91199 Patient's noncompliance with other medical treatment and regimen due to unspecified reason: Secondary | ICD-10-CM

## 2012-05-14 LAB — COMPREHENSIVE METABOLIC PANEL
ALT (GPT): 19 U/L (ref 10–64)
AST (GOT): 27 U/L (ref 15–40)
Albumin: 4.4 g/dL (ref 3.5–5.2)
Alkaline Phosphatase (Total): 49 U/L (ref 35–109)
Anion Gap: 8 (ref 3–11)
Bilirubin (Total): 1 mg/dL (ref 0.2–1.3)
Calcium: 9.2 mg/dL (ref 8.9–10.2)
Carbon Dioxide, Total: 26 mEq/L (ref 22–32)
Chloride: 103 mEq/L (ref 98–108)
Creatinine: 1 mg/dL (ref 0.51–1.18)
GFR, Calc, African American: >60 mL/min (ref >59)
GFR, Calc, European American: >60 mL/min (ref >59)
Glucose: 91 mg/dL (ref 62–125)
Potassium: 3.5 mEq/L — ABNORMAL LOW (ref 3.7–5.2)
Protein (Total): 6.9 g/dL (ref 6.0–8.2)
Sodium: 137 mEq/L (ref 136–145)
Urea Nitrogen: 10 mg/dL (ref 8–21)

## 2012-05-14 LAB — CBC (HEMOGRAM)
Hematocrit: 41 % (ref 38–50)
Hemoglobin: 13.9 g/dL (ref 13.0–18.0)
MCH: 31.5 pg (ref 27.3–33.6)
MCHC: 34.2 g/dL (ref 32.2–36.5)
MCV: 92 fL (ref 81–98)
Platelet Count: 218 10*3/uL (ref 150–400)
RBC: 4.41 mil/uL (ref 4.40–5.60)
RDW-CV: 12.9 % (ref 11.6–14.4)
WBC: 6.28 10*3/uL (ref 4.3–10.0)

## 2012-05-14 LAB — TROPONIN BY I_STAT (POC): Troponin_I by i Stat (POC): 0.01 ng/mL (ref 0.00–0.39)

## 2012-05-14 LAB — CHEM8 ED PANEL BY I_STAT (POC)
Anion Gap (POC): 17 mmol/L
Calcium, Ionized (POC): 1.21 mmol/L (ref 1.18–1.38)
Chloride (POC): 104 mEq/L (ref 98–108)
Creatinine (POC): 1.1 mg/dL (ref 0.51–1.18)
Glucose (POC): 90 mg/dL (ref 62–125)
HGB, POC: 14.6 g/dL (ref 13.0–18.0)
Hematocrit by I_Stat  (POC): 43 % (ref 38–50)
K (POC): 3.7 mEq/L (ref 3.7–5.2)
Na (POC): 143 mEq/L (ref 136–145)
Total CO2 (POC): 26 mEq/L (ref 22–32)
Urea Nitrogen (POC): 10 mg/dL (ref 8–21)

## 2012-05-14 LAB — 1ST EXTRA RED TOP

## 2012-05-14 LAB — PROTHROMBIN & PTT
Partial Thromboplastin Time: 38 s — ABNORMAL HIGH (ref 22–35)
Prothrombin INR: 2.3 — ABNORMAL HIGH (ref 0.8–1.3)
Prothrombin Time Patient: 23.1 s — ABNORMAL HIGH (ref 10.7–15.6)

## 2012-05-14 LAB — TROPONIN_I: Troponin_I: 0.01 ng/mL (ref 0.00–0.39)

## 2012-05-14 LAB — FIBRINOGEN: Fibrinogen: 235 mg/dL (ref 150–400)

## 2012-05-17 ENCOUNTER — Ambulatory Visit: Payer: No Typology Code available for payment source

## 2012-05-18 ENCOUNTER — Telehealth (HOSPITAL_BASED_OUTPATIENT_CLINIC_OR_DEPARTMENT_OTHER): Payer: Self-pay

## 2012-05-18 ENCOUNTER — Ambulatory Visit: Payer: No Typology Code available for payment source | Attending: Internal Medicine | Admitting: Internal Medicine

## 2012-05-18 ENCOUNTER — Encounter (HOSPITAL_BASED_OUTPATIENT_CLINIC_OR_DEPARTMENT_OTHER): Payer: Self-pay | Admitting: Internal Medicine

## 2012-05-18 DIAGNOSIS — I6350 Cerebral infarction due to unspecified occlusion or stenosis of unspecified cerebral artery: Secondary | ICD-10-CM | POA: Insufficient documentation

## 2012-05-18 LAB — PROTHROMBIN TIME
Prothrombin INR: 2.8 — ABNORMAL HIGH (ref 0.8–1.3)
Prothrombin Time Patient: 26.9 s — ABNORMAL HIGH (ref 10.7–15.6)

## 2012-05-18 NOTE — Patient Instructions (Signed)
Walk 30 minutes a day.

## 2012-05-19 ENCOUNTER — Ambulatory Visit: Payer: No Typology Code available for payment source

## 2012-05-19 ENCOUNTER — Encounter (HOSPITAL_BASED_OUTPATIENT_CLINIC_OR_DEPARTMENT_OTHER): Payer: No Typology Code available for payment source | Admitting: Internal Medicine

## 2012-05-19 NOTE — Progress Notes (Signed)
This note was dictated.

## 2012-05-20 ENCOUNTER — Telehealth (HOSPITAL_BASED_OUTPATIENT_CLINIC_OR_DEPARTMENT_OTHER): Payer: Self-pay

## 2012-05-25 ENCOUNTER — Telehealth (HOSPITAL_BASED_OUTPATIENT_CLINIC_OR_DEPARTMENT_OTHER): Payer: Self-pay

## 2012-05-26 ENCOUNTER — Other Ambulatory Visit (HOSPITAL_BASED_OUTPATIENT_CLINIC_OR_DEPARTMENT_OTHER): Payer: Self-pay | Admitting: Pharmacotherapy

## 2012-05-26 ENCOUNTER — Ambulatory Visit
Admit: 2012-05-26 | Discharge: 2012-05-26 | Disposition: A | Discharge status: Home / Self Care | Payer: No Typology Code available for payment source | Attending: Pharmacotherapy | Admitting: Pharmacotherapy

## 2012-05-26 DIAGNOSIS — Z954 Presence of other heart-valve replacement: Secondary | ICD-10-CM | POA: Insufficient documentation

## 2012-05-26 DIAGNOSIS — Z5181 Encounter for therapeutic drug level monitoring: Secondary | ICD-10-CM | POA: Insufficient documentation

## 2012-05-26 DIAGNOSIS — Z7901 Long term (current) use of anticoagulants: Secondary | ICD-10-CM | POA: Insufficient documentation

## 2012-05-26 LAB — PROTHROMBIN TIME
Prothrombin INR: 4.2 — ABNORMAL HIGH (ref 0.8–1.3)
Prothrombin Time Patient: 37.3 s — ABNORMAL HIGH (ref 10.7–15.6)

## 2012-05-27 ENCOUNTER — Ambulatory Visit: Payer: No Typology Code available for payment source

## 2012-06-04 ENCOUNTER — Ambulatory Visit
Admit: 2012-06-04 | Discharge: 2012-06-04 | Disposition: A | Discharge status: Home / Self Care | Payer: No Typology Code available for payment source | Attending: Pharmacotherapy | Admitting: Pharmacotherapy

## 2012-06-04 ENCOUNTER — Telehealth (HOSPITAL_BASED_OUTPATIENT_CLINIC_OR_DEPARTMENT_OTHER): Payer: Self-pay

## 2012-06-04 ENCOUNTER — Other Ambulatory Visit (HOSPITAL_BASED_OUTPATIENT_CLINIC_OR_DEPARTMENT_OTHER): Payer: Self-pay | Admitting: Pharmacotherapy

## 2012-06-04 DIAGNOSIS — Z7901 Long term (current) use of anticoagulants: Secondary | ICD-10-CM | POA: Insufficient documentation

## 2012-06-04 DIAGNOSIS — Z954 Presence of other heart-valve replacement: Secondary | ICD-10-CM | POA: Insufficient documentation

## 2012-06-04 DIAGNOSIS — Z5181 Encounter for therapeutic drug level monitoring: Secondary | ICD-10-CM | POA: Insufficient documentation

## 2012-06-04 LAB — PROTHROMBIN TIME
Prothrombin INR: 3.3 — ABNORMAL HIGH (ref 0.8–1.3)
Prothrombin Time Patient: 31 s — ABNORMAL HIGH (ref 10.7–15.6)

## 2012-06-17 ENCOUNTER — Ambulatory Visit: Payer: No Typology Code available for payment source | Attending: Cardiovascular Disease

## 2012-06-17 ENCOUNTER — Other Ambulatory Visit (HOSPITAL_BASED_OUTPATIENT_CLINIC_OR_DEPARTMENT_OTHER): Payer: No Typology Code available for payment source

## 2012-06-17 ENCOUNTER — Ambulatory Visit (HOSPITAL_BASED_OUTPATIENT_CLINIC_OR_DEPARTMENT_OTHER): Payer: No Typology Code available for payment source | Admitting: Pediatrics

## 2012-06-17 ENCOUNTER — Telehealth (HOSPITAL_BASED_OUTPATIENT_CLINIC_OR_DEPARTMENT_OTHER): Payer: Self-pay

## 2012-06-17 ENCOUNTER — Other Ambulatory Visit (HOSPITAL_BASED_OUTPATIENT_CLINIC_OR_DEPARTMENT_OTHER): Payer: Self-pay | Admitting: Pharmacotherapy

## 2012-06-17 ENCOUNTER — Ambulatory Visit (HOSPITAL_BASED_OUTPATIENT_CLINIC_OR_DEPARTMENT_OTHER): Payer: No Typology Code available for payment source

## 2012-06-17 ENCOUNTER — Encounter (HOSPITAL_BASED_OUTPATIENT_CLINIC_OR_DEPARTMENT_OTHER): Payer: Self-pay | Admitting: Pediatrics

## 2012-06-17 ENCOUNTER — Encounter (HOSPITAL_BASED_OUTPATIENT_CLINIC_OR_DEPARTMENT_OTHER): Payer: No Typology Code available for payment source | Admitting: Family

## 2012-06-17 DIAGNOSIS — I495 Sick sinus syndrome: Secondary | ICD-10-CM | POA: Insufficient documentation

## 2012-06-17 DIAGNOSIS — Q249 Congenital malformation of heart, unspecified: Secondary | ICD-10-CM

## 2012-06-17 DIAGNOSIS — Z950 Presence of cardiac pacemaker: Secondary | ICD-10-CM | POA: Insufficient documentation

## 2012-06-17 DIAGNOSIS — Z954 Presence of other heart-valve replacement: Secondary | ICD-10-CM

## 2012-06-17 DIAGNOSIS — Q212 Atrioventricular septal defect, unspecified as to partial or complete: Secondary | ICD-10-CM | POA: Insufficient documentation

## 2012-06-17 LAB — PROTHROMBIN TIME
Prothrombin INR: 3.6 — ABNORMAL HIGH (ref 0.8–1.3)
Prothrombin Time Patient: 33 s — ABNORMAL HIGH (ref 10.7–15.6)

## 2012-06-18 ENCOUNTER — Ambulatory Visit: Payer: No Typology Code available for payment source

## 2012-07-01 ENCOUNTER — Other Ambulatory Visit (HOSPITAL_BASED_OUTPATIENT_CLINIC_OR_DEPARTMENT_OTHER): Payer: Self-pay | Admitting: Pharmacotherapy

## 2012-07-01 ENCOUNTER — Ambulatory Visit
Admit: 2012-07-01 | Discharge: 2012-07-01 | Disposition: A | Discharge status: Home / Self Care | Payer: No Typology Code available for payment source | Attending: Pharmacotherapy | Admitting: Pharmacotherapy

## 2012-07-01 ENCOUNTER — Telehealth (HOSPITAL_BASED_OUTPATIENT_CLINIC_OR_DEPARTMENT_OTHER): Payer: Self-pay

## 2012-07-01 DIAGNOSIS — Z5181 Encounter for therapeutic drug level monitoring: Secondary | ICD-10-CM | POA: Insufficient documentation

## 2012-07-01 DIAGNOSIS — Z7901 Long term (current) use of anticoagulants: Secondary | ICD-10-CM | POA: Insufficient documentation

## 2012-07-01 DIAGNOSIS — Z954 Presence of other heart-valve replacement: Secondary | ICD-10-CM | POA: Insufficient documentation

## 2012-07-01 LAB — PROTHROMBIN TIME
Prothrombin INR: 3.4 — ABNORMAL HIGH (ref 0.8–1.3)
Prothrombin Time Patient: 31.3 s — ABNORMAL HIGH (ref 10.7–15.6)

## 2012-07-02 ENCOUNTER — Ambulatory Visit: Payer: No Typology Code available for payment source

## 2012-07-21 ENCOUNTER — Encounter (HOSPITAL_BASED_OUTPATIENT_CLINIC_OR_DEPARTMENT_OTHER): Payer: Self-pay | Admitting: Internal Medicine

## 2012-07-21 NOTE — Telephone Encounter (Signed)
As far as I know the anticoag clinic just puts these in as place holders and then contacts the patients by phone.  Can you clarify with the anticoag clinic and then get back to his aunt and reassure her that these are not actual appointments?

## 2012-07-21 NOTE — Telephone Encounter (Signed)
Routing to Dr. Greenberg

## 2012-07-21 NOTE — Telephone Encounter (Signed)
Please review e-care message.

## 2012-07-21 NOTE — Telephone Encounter (Signed)
Spoke with [facility]. We're correct. They are telephone appointments. I responded to Windell Moulding via e-care.

## 2012-07-21 NOTE — Telephone Encounter (Signed)
LM for anticoag clinic asking them to clarify that these are telephone appointments as opposed to in person appointments

## 2012-07-22 ENCOUNTER — Telehealth (HOSPITAL_BASED_OUTPATIENT_CLINIC_OR_DEPARTMENT_OTHER): Payer: Self-pay

## 2012-07-23 ENCOUNTER — Ambulatory Visit
Admit: 2012-07-23 | Discharge: 2012-07-23 | Disposition: A | Discharge status: Home / Self Care | Payer: No Typology Code available for payment source | Attending: Pharmacotherapy | Admitting: Pharmacotherapy

## 2012-07-23 ENCOUNTER — Other Ambulatory Visit (HOSPITAL_BASED_OUTPATIENT_CLINIC_OR_DEPARTMENT_OTHER): Payer: Self-pay | Admitting: Pharmacotherapy

## 2012-07-23 DIAGNOSIS — Z954 Presence of other heart-valve replacement: Secondary | ICD-10-CM | POA: Insufficient documentation

## 2012-07-23 DIAGNOSIS — Z5181 Encounter for therapeutic drug level monitoring: Secondary | ICD-10-CM | POA: Insufficient documentation

## 2012-07-23 DIAGNOSIS — Z7901 Long term (current) use of anticoagulants: Secondary | ICD-10-CM | POA: Insufficient documentation

## 2012-07-23 LAB — PROTHROMBIN TIME
Prothrombin INR: 2.6 — ABNORMAL HIGH (ref 0.8–1.3)
Prothrombin Time Patient: 25.5 s — ABNORMAL HIGH (ref 10.7–15.6)

## 2012-07-26 ENCOUNTER — Ambulatory Visit: Payer: No Typology Code available for payment source

## 2012-08-02 ENCOUNTER — Ambulatory Visit (HOSPITAL_BASED_OUTPATIENT_CLINIC_OR_DEPARTMENT_OTHER): Payer: No Typology Code available for payment source | Admitting: Internal Medicine

## 2012-08-02 ENCOUNTER — Ambulatory Visit: Payer: No Typology Code available for payment source | Attending: Internal Medicine | Admitting: Internal Medicine

## 2012-08-02 VITALS — BP 100/80 | HR 72 | Wt 170.0 lb

## 2012-08-02 DIAGNOSIS — I340 Nonrheumatic mitral (valve) insufficiency: Secondary | ICD-10-CM

## 2012-08-02 DIAGNOSIS — Z309 Encounter for contraceptive management, unspecified: Secondary | ICD-10-CM | POA: Insufficient documentation

## 2012-08-02 NOTE — Progress Notes (Signed)
[facility]                       This patient failed a scheduled appointment today.  Disposition: Referring provider notified

## 2012-08-02 NOTE — Progress Notes (Signed)
[facility]     Chief Complaint:  Chief Complaint   Patient presents with   . Vasectomy     Pt. 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 He is on warfarin and followed closely in the anticoag clinic.    #contraception - The patient would like referral for a vasectomy.  He has discussed this with his PCP, Dr. Jimmie Molly.  Per his cardiologist, he does not have a CV contraindication to getting a vasectomy, but they do recommend as little time off coumadin as possible given his mechanical mitral valve.   He has one 70 year old daughter and a girlfriend who he plans on marrying.  His girlfriend does not want children.        Patient Active Problem List    Diagnosis Date Noted   . Pacemaker [V45.01] 01/22/2012   . 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 CVA (cerebral vascular accident) [434.91] 09/10/2011   . Sprain of ankle- Right 06/2011 [845.00] 06/16/2011     Current Outpatient Prescriptions   Medication Sig   . Acetaminophen 500 MG Oral Tab Take 2 tablet by mouth every 8 hours if needed to relieve pain   . Warfarin Sodium 5 MG Oral Tab          ROS:  Complete ROS negative except as per HPI.    Physical Examination:   BP 100/80  Pulse 72  Wt 170 lb (77.111 kg)  General: pleasant, well-developed male sitting in NAD.      Assessment/Plan:  I discussed the patient with Dewayne Hatch, attending physician, for a problem focused visit.    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  He is very adamant that he wants to proceed with this procedure.  I discussed with him that there are other long-acting reversible contraceptive methods available for his girlfriend such as IUD placement that would be as efficacious as a vasectomy, and would be less risky (given his anti-coagulation issue).  He would still like to pursue vasectomy.      --referral made to [facility] to discuss vasectomy    There is no cardiovascular contraindication to him undergoing the procedure, though due to his mitral regurgitation resulting in mechanical mitral valve, we would recommend as little of a break from Coumadin as possible.    RTC PRN.    -------------------------------------------  Attending: Dewayne Hatch, MD  I discussed the history, exam and medical decision making for this patient with the resident physician. I was present in the clinic during the provisions of these services with no other clinical duties. I concur with the management plan as discussed.  -------------------------------------------

## 2012-08-18 ENCOUNTER — Ambulatory Visit: Payer: No Typology Code available for payment source | Attending: Registered Nurse | Admitting: Registered Nurse

## 2012-08-18 DIAGNOSIS — T887XXA Unspecified adverse effect of drug or medicament, initial encounter: Secondary | ICD-10-CM | POA: Insufficient documentation

## 2012-08-18 DIAGNOSIS — Z3009 Encounter for other general counseling and advice on contraception: Secondary | ICD-10-CM | POA: Insufficient documentation

## 2012-08-27 ENCOUNTER — Telehealth (HOSPITAL_BASED_OUTPATIENT_CLINIC_OR_DEPARTMENT_OTHER): Payer: Self-pay

## 2012-08-31 ENCOUNTER — Other Ambulatory Visit (HOSPITAL_BASED_OUTPATIENT_CLINIC_OR_DEPARTMENT_OTHER): Payer: Self-pay | Admitting: Pharmacotherapy

## 2012-08-31 ENCOUNTER — Ambulatory Visit: Payer: No Typology Code available for payment source | Attending: Pharmacotherapy

## 2012-08-31 DIAGNOSIS — Z7901 Long term (current) use of anticoagulants: Secondary | ICD-10-CM | POA: Insufficient documentation

## 2012-08-31 DIAGNOSIS — Z954 Presence of other heart-valve replacement: Secondary | ICD-10-CM | POA: Insufficient documentation

## 2012-08-31 DIAGNOSIS — Z5181 Encounter for therapeutic drug level monitoring: Secondary | ICD-10-CM | POA: Insufficient documentation

## 2012-08-31 LAB — PROTHROMBIN TIME
Prothrombin INR: 2.8 — ABNORMAL HIGH (ref 0.8–1.3)
Prothrombin Time Patient: 29.1 s — ABNORMAL HIGH (ref 10.7–15.6)

## 2012-10-01 ENCOUNTER — Telehealth (HOSPITAL_BASED_OUTPATIENT_CLINIC_OR_DEPARTMENT_OTHER): Payer: Self-pay

## 2012-10-08 ENCOUNTER — Ambulatory Visit
Admit: 2012-10-08 | Discharge: 2012-10-08 | Disposition: A | Discharge status: Home / Self Care | Payer: No Typology Code available for payment source | Attending: Pharmacotherapy | Admitting: Pharmacotherapy

## 2012-10-08 ENCOUNTER — Other Ambulatory Visit (HOSPITAL_BASED_OUTPATIENT_CLINIC_OR_DEPARTMENT_OTHER): Payer: Self-pay | Admitting: Pharmacotherapy

## 2012-10-08 DIAGNOSIS — Z954 Presence of other heart-valve replacement: Secondary | ICD-10-CM | POA: Insufficient documentation

## 2012-10-08 DIAGNOSIS — Z5181 Encounter for therapeutic drug level monitoring: Secondary | ICD-10-CM | POA: Insufficient documentation

## 2012-10-08 DIAGNOSIS — Z7901 Long term (current) use of anticoagulants: Secondary | ICD-10-CM | POA: Insufficient documentation

## 2012-10-08 LAB — PROTHROMBIN TIME
Prothrombin INR: 2.4 — ABNORMAL HIGH (ref 0.8–1.3)
Prothrombin Time Patient: 25.9 s — ABNORMAL HIGH (ref 10.7–15.6)

## 2012-10-11 ENCOUNTER — Ambulatory Visit: Payer: No Typology Code available for payment source

## 2012-10-22 ENCOUNTER — Telehealth (HOSPITAL_BASED_OUTPATIENT_CLINIC_OR_DEPARTMENT_OTHER): Payer: Self-pay

## 2012-10-27 ENCOUNTER — Other Ambulatory Visit (HOSPITAL_BASED_OUTPATIENT_CLINIC_OR_DEPARTMENT_OTHER): Payer: Self-pay | Admitting: Pharmacotherapy

## 2012-10-27 ENCOUNTER — Ambulatory Visit
Admit: 2012-10-27 | Discharge: 2012-10-27 | Disposition: A | Discharge status: Home / Self Care | Payer: No Typology Code available for payment source | Attending: Pharmacotherapy | Admitting: Pharmacotherapy

## 2012-10-27 DIAGNOSIS — Z7901 Long term (current) use of anticoagulants: Secondary | ICD-10-CM | POA: Insufficient documentation

## 2012-10-27 DIAGNOSIS — Z5181 Encounter for therapeutic drug level monitoring: Secondary | ICD-10-CM | POA: Insufficient documentation

## 2012-10-27 DIAGNOSIS — Z954 Presence of other heart-valve replacement: Secondary | ICD-10-CM | POA: Insufficient documentation

## 2012-10-27 LAB — PROTHROMBIN TIME
Prothrombin INR: 2.5 — ABNORMAL HIGH (ref 0.8–1.3)
Prothrombin Time Patient: 27 s — ABNORMAL HIGH (ref 10.7–15.6)

## 2012-10-28 ENCOUNTER — Ambulatory Visit: Payer: No Typology Code available for payment source

## 2012-11-10 ENCOUNTER — Ambulatory Visit
Admit: 2012-11-10 | Discharge: 2012-11-10 | Disposition: A | Discharge status: Home / Self Care | Payer: No Typology Code available for payment source | Attending: Pharmacotherapy | Admitting: Pharmacotherapy

## 2012-11-10 ENCOUNTER — Telehealth (HOSPITAL_BASED_OUTPATIENT_CLINIC_OR_DEPARTMENT_OTHER): Payer: Self-pay

## 2012-11-10 ENCOUNTER — Other Ambulatory Visit (HOSPITAL_BASED_OUTPATIENT_CLINIC_OR_DEPARTMENT_OTHER): Payer: Self-pay | Admitting: Pharmacotherapy

## 2012-11-10 DIAGNOSIS — Z5181 Encounter for therapeutic drug level monitoring: Secondary | ICD-10-CM | POA: Insufficient documentation

## 2012-11-10 DIAGNOSIS — Z7901 Long term (current) use of anticoagulants: Secondary | ICD-10-CM | POA: Insufficient documentation

## 2012-11-10 DIAGNOSIS — Z954 Presence of other heart-valve replacement: Secondary | ICD-10-CM | POA: Insufficient documentation

## 2012-11-10 LAB — PROTHROMBIN TIME
Prothrombin INR: 3.2 — ABNORMAL HIGH (ref 0.8–1.3)
Prothrombin Time Patient: 32.6 s — ABNORMAL HIGH (ref 10.7–15.6)

## 2012-11-11 ENCOUNTER — Ambulatory Visit: Payer: No Typology Code available for payment source

## 2012-11-11 ENCOUNTER — Ambulatory Visit (HOSPITAL_BASED_OUTPATIENT_CLINIC_OR_DEPARTMENT_OTHER): Payer: No Typology Code available for payment source | Admitting: Urology

## 2012-12-09 ENCOUNTER — Telehealth (HOSPITAL_BASED_OUTPATIENT_CLINIC_OR_DEPARTMENT_OTHER): Payer: Self-pay

## 2012-12-09 ENCOUNTER — Other Ambulatory Visit (HOSPITAL_BASED_OUTPATIENT_CLINIC_OR_DEPARTMENT_OTHER): Payer: Self-pay | Admitting: Pharmacotherapy

## 2012-12-09 ENCOUNTER — Ambulatory Visit: Payer: No Typology Code available for payment source | Attending: Pharmacotherapy | Admitting: Pediatrics

## 2012-12-09 DIAGNOSIS — Q251 Coarctation of aorta: Secondary | ICD-10-CM | POA: Insufficient documentation

## 2012-12-09 DIAGNOSIS — I495 Sick sinus syndrome: Secondary | ICD-10-CM

## 2012-12-09 DIAGNOSIS — Z7901 Long term (current) use of anticoagulants: Secondary | ICD-10-CM | POA: Insufficient documentation

## 2012-12-09 DIAGNOSIS — Z5181 Encounter for therapeutic drug level monitoring: Secondary | ICD-10-CM | POA: Insufficient documentation

## 2012-12-09 DIAGNOSIS — Z954 Presence of other heart-valve replacement: Secondary | ICD-10-CM | POA: Insufficient documentation

## 2012-12-10 LAB — PROTHROMBIN TIME
Prothrombin INR: 4 — ABNORMAL HIGH (ref 0.8–1.3)
Prothrombin Time Patient: 38.7 s — ABNORMAL HIGH (ref 10.7–15.6)

## 2012-12-13 ENCOUNTER — Ambulatory Visit: Payer: No Typology Code available for payment source

## 2012-12-24 ENCOUNTER — Telehealth (HOSPITAL_BASED_OUTPATIENT_CLINIC_OR_DEPARTMENT_OTHER): Payer: Self-pay

## 2013-01-13 ENCOUNTER — Ambulatory Visit (HOSPITAL_BASED_OUTPATIENT_CLINIC_OR_DEPARTMENT_OTHER): Payer: No Typology Code available for payment source | Admitting: Urology

## 2013-01-17 ENCOUNTER — Other Ambulatory Visit (HOSPITAL_BASED_OUTPATIENT_CLINIC_OR_DEPARTMENT_OTHER): Payer: Self-pay | Admitting: Pharmacotherapy

## 2013-01-17 ENCOUNTER — Ambulatory Visit: Payer: No Typology Code available for payment source | Attending: Pharmacotherapy

## 2013-01-17 DIAGNOSIS — Z954 Presence of other heart-valve replacement: Secondary | ICD-10-CM | POA: Insufficient documentation

## 2013-01-17 DIAGNOSIS — Z7901 Long term (current) use of anticoagulants: Secondary | ICD-10-CM | POA: Insufficient documentation

## 2013-01-17 DIAGNOSIS — Z5181 Encounter for therapeutic drug level monitoring: Secondary | ICD-10-CM | POA: Insufficient documentation

## 2013-01-17 LAB — PROTHROMBIN TIME
Prothrombin INR: 3.6 — ABNORMAL HIGH (ref 0.8–1.3)
Prothrombin Time Patient: 35.3 s — ABNORMAL HIGH (ref 10.7–15.6)

## 2013-02-07 DIAGNOSIS — Z7901 Long term (current) use of anticoagulants: Secondary | ICD-10-CM | POA: Insufficient documentation

## 2013-02-18 ENCOUNTER — Telehealth (HOSPITAL_BASED_OUTPATIENT_CLINIC_OR_DEPARTMENT_OTHER): Payer: Self-pay

## 2013-02-23 ENCOUNTER — Encounter (HOSPITAL_BASED_OUTPATIENT_CLINIC_OR_DEPARTMENT_OTHER): Payer: Self-pay | Admitting: Pharmacotherapy

## 2013-03-04 ENCOUNTER — Telehealth (HOSPITAL_BASED_OUTPATIENT_CLINIC_OR_DEPARTMENT_OTHER): Payer: Self-pay

## 2013-03-04 NOTE — Telephone Encounter (Signed)
Left msg with pt re: missed appt on 02/18/13

## 2013-03-09 ENCOUNTER — Observation Stay
Admission: AD | Admit: 2013-03-09 | Discharge: 2013-03-10 | Disposition: A | Discharge status: Home / Self Care | Payer: No Typology Code available for payment source | Source: Other Acute Inpatient Hospital | Attending: Cardiovascular Disease | Admitting: Cardiovascular Disease

## 2013-03-09 ENCOUNTER — Other Ambulatory Visit (HOSPITAL_COMMUNITY): Payer: Self-pay

## 2013-03-09 ENCOUNTER — Observation Stay (HOSPITAL_COMMUNITY): Payer: No Typology Code available for payment source | Admitting: Cardiovascular Disease

## 2013-03-09 DIAGNOSIS — R55 Syncope and collapse: Secondary | ICD-10-CM | POA: Insufficient documentation

## 2013-03-09 DIAGNOSIS — R42 Dizziness and giddiness: Secondary | ICD-10-CM

## 2013-03-09 DIAGNOSIS — Z7901 Long term (current) use of anticoagulants: Secondary | ICD-10-CM

## 2013-03-09 DIAGNOSIS — Z8673 Personal history of transient ischemic attack (TIA), and cerebral infarction without residual deficits: Secondary | ICD-10-CM | POA: Insufficient documentation

## 2013-03-09 DIAGNOSIS — Z8774 Personal history of (corrected) congenital malformations of heart and circulatory system: Secondary | ICD-10-CM | POA: Insufficient documentation

## 2013-03-09 DIAGNOSIS — T82897A Other specified complication of cardiac prosthetic devices, implants and grafts, initial encounter: Secondary | ICD-10-CM | POA: Insufficient documentation

## 2013-03-09 DIAGNOSIS — Z950 Presence of cardiac pacemaker: Secondary | ICD-10-CM | POA: Insufficient documentation

## 2013-03-09 DIAGNOSIS — Z954 Presence of other heart-valve replacement: Secondary | ICD-10-CM

## 2013-03-09 DIAGNOSIS — R109 Unspecified abdominal pain: Secondary | ICD-10-CM

## 2013-03-09 DIAGNOSIS — F17200 Nicotine dependence, unspecified, uncomplicated: Secondary | ICD-10-CM | POA: Insufficient documentation

## 2013-03-09 DIAGNOSIS — R079 Chest pain, unspecified: Secondary | ICD-10-CM

## 2013-03-09 DIAGNOSIS — Z23 Encounter for immunization: Secondary | ICD-10-CM | POA: Insufficient documentation

## 2013-03-09 DIAGNOSIS — R071 Chest pain on breathing: Principal | ICD-10-CM | POA: Insufficient documentation

## 2013-03-09 DIAGNOSIS — I495 Sick sinus syndrome: Secondary | ICD-10-CM | POA: Insufficient documentation

## 2013-03-09 LAB — CBC (HEMOGRAM)
Hematocrit: 39 % (ref 38–50)
Hemoglobin: 13.1 g/dL (ref 13.0–18.0)
MCH: 31.4 pg (ref 27.3–33.6)
MCHC: 33.7 g/dL (ref 32.2–36.5)
MCV: 93 fL (ref 81–98)
Platelet Count: 191 10*3/uL (ref 150–400)
RBC: 4.17 mil/uL — ABNORMAL LOW (ref 4.40–5.60)
RDW-CV: 12.9 % (ref 11.6–14.4)
WBC: 7.95 10*3/uL (ref 4.3–10.0)

## 2013-03-09 LAB — 1ST EXTRA BLUE TOP

## 2013-03-09 LAB — PROTHROMBIN & PTT
Partial Thromboplastin Time: 43 s — ABNORMAL HIGH (ref 22–35)
Prothrombin INR: 3.1 — ABNORMAL HIGH (ref 0.8–1.3)
Prothrombin Time Patient: 31.8 s — ABNORMAL HIGH (ref 10.7–15.6)

## 2013-03-09 LAB — BASIC METABOLIC PANEL
Anion Gap: 5 (ref 3–11)
Calcium: 8.8 mg/dL — ABNORMAL LOW (ref 8.9–10.2)
Carbon Dioxide, Total: 28 mEq/L (ref 22–32)
Chloride: 108 mEq/L (ref 98–108)
Creatinine: 0.96 mg/dL (ref 0.51–1.18)
GFR, Calc, African American: >60 mL/min (ref >59)
GFR, Calc, European American: >60 mL/min (ref >59)
Glucose: 100 mg/dL (ref 62–125)
Potassium: 4.2 mEq/L (ref 3.7–5.2)
Sodium: 141 mEq/L (ref 136–145)
Urea Nitrogen: 11 mg/dL (ref 8–21)

## 2013-03-10 ENCOUNTER — Other Ambulatory Visit (HOSPITAL_BASED_OUTPATIENT_CLINIC_OR_DEPARTMENT_OTHER): Payer: Self-pay | Admitting: Cardiovascular Disease

## 2013-03-10 ENCOUNTER — Other Ambulatory Visit (HOSPITAL_COMMUNITY): Payer: Self-pay

## 2013-03-10 DIAGNOSIS — Z7901 Long term (current) use of anticoagulants: Secondary | ICD-10-CM

## 2013-03-10 LAB — TROPONIN_I
Troponin_I Interpretation: NORMAL
Troponin_I: <0.03 ng/mL (ref <0.04)

## 2013-03-10 LAB — PROTHROMBIN TIME

## 2013-03-10 LAB — R/O MRSA

## 2013-03-10 LAB — BASIC METABOLIC PANEL
Anion Gap: 6 (ref 3–11)
Calcium: 8.7 mg/dL — ABNORMAL LOW (ref 8.9–10.2)
Carbon Dioxide, Total: 26 mEq/L (ref 22–32)
Chloride: 108 mEq/L (ref 98–108)
Creatinine: 0.99 mg/dL (ref 0.51–1.18)
GFR, Calc, African American: >60 mL/min (ref >59)
GFR, Calc, European American: >60 mL/min (ref >59)
Glucose: 98 mg/dL (ref 62–125)
Potassium: 3.9 mEq/L (ref 3.7–5.2)
Sodium: 140 mEq/L (ref 136–145)
Urea Nitrogen: 11 mg/dL (ref 8–21)

## 2013-03-11 ENCOUNTER — Encounter (HOSPITAL_BASED_OUTPATIENT_CLINIC_OR_DEPARTMENT_OTHER): Payer: Self-pay | Admitting: Pharmacotherapy

## 2013-03-11 MED ORDER — COUMADIN 5 MG OR TABS
ORAL_TABLET | ORAL | Status: DC
Start: 2013-03-10 — End: 2013-04-16

## 2013-03-11 NOTE — Progress Notes (Signed)
HOSPITAL ADMISSION 03/09/13 to 03/10/13 - ANTICOAGULATION SUMMARY    ANTICOAGULATION TREATMENT PLAN    Indication: MVR; recurrent CVA  Goal INR: 2.5-3.5  Duration of Therapy: chronic    Hemorrhagic Risk Score: 1  Warfarin Tablet Size: 5mg     Relevant Historic Information: CVA 2008; 2005; 2006    Referring Provider: Nadene Rubins        INTERVAL HISTORY    Ripley Fraise was last seen in [facility] on 01/17/13 - his  INR was 3.6 and he was instructed to continue warfarin 5mg  M/Th and 7.5mg  all other days.  He failed his return visit on 02/18/13, and was subsequently admitted to [facility] on 03/09/13 for left-sided chest pain, determined to be musculoskeletal in etiology    INPATIENT ANTICOAGULATION FLOWSHEET     DATE    INR    Wafarin dose  03/09/13     3.1      7.5    RELEVANT MEDICATION CHANGES    Inpatient team added lidocaine patch 5% apply q24h and APAP 325-650mg  q4h prn    PLAN:  1) discharged home on usual warfarin dose 5mg  M/Th and 7.5mg  all other days  2) Next [facility] visit scheduled for 03/31/2013, a THursday which is best for Shakur's work schedule    Melinda Crutch, PharmD, CACP

## 2013-03-14 ENCOUNTER — Ambulatory Visit: Payer: No Typology Code available for payment source | Attending: Internal Medicine | Admitting: Internal Medicine

## 2013-03-14 ENCOUNTER — Encounter (HOSPITAL_BASED_OUTPATIENT_CLINIC_OR_DEPARTMENT_OTHER): Payer: Self-pay | Admitting: Internal Medicine

## 2013-03-14 VITALS — BP 100/68 | HR 72 | Temp 98.7°F | Wt 172.0 lb

## 2013-03-14 DIAGNOSIS — N4889 Other specified disorders of penis: Secondary | ICD-10-CM

## 2013-03-14 DIAGNOSIS — N489 Disorder of penis, unspecified: Secondary | ICD-10-CM

## 2013-03-14 DIAGNOSIS — A6000 Herpesviral infection of urogenital system, unspecified: Secondary | ICD-10-CM | POA: Insufficient documentation

## 2013-03-14 MED ORDER — VALACYCLOVIR HCL 500 MG OR TABS
ORAL_TABLET | ORAL | Status: DC
Start: 2013-03-14 — End: 2013-04-13

## 2013-03-14 NOTE — Progress Notes (Signed)
[facility]     Chief Complaint:  Chief Complaint   Patient presents with   . Other     break out on Penis     Walter Rice is a 27 year old year old English speaking male who presents today with genital lesions.    Issues Discussed:   #genital lesions:  -over the last week, has developed several genital lesions. Started with one ~1cm shallow ulcer over shaft of penis, developed several small fluid filled bumps over base of penis now, some starting to crust over and scab  -no h/o prior lesions like this. No fevers, chills, local lymph node swelling. Lesions are fairly painless but hurt a little.   -sexually active with girlfriend, who has active genital herpes lesions now per her. She is taking acyclovir for them.  -denies penile discharge  -would like full STI screen    ROS:  Complete ROS reviewed and negative except for what is noted above in HPI.    Physical Examination:   Filed Vitals:    03/14/13 1634   BP: 100/68   Pulse: 72   Temp: 98.7 F (37.1 C)   TempSrc: Temporal   Weight: 172 lb (78.019 kg)     Gen: well appearing AAM in NAD  GU: shaft of penis with one healing shallow ulcer with scab, group of vesicles near scrotum, one sent for herpes PCR; no discharge noted, no other penile lesions, no local LAD or erythema    Assessment/Plan:  I discussed the patient with Verdie Shire, attending physician, for a problem focused visit.  (607.89) Penile lesion  (primary encounter diagnosis), genital herpes  Most likely genital herpes. Vesicle sent for herpes group. Started on valacyclovir 1000mg  BID x7 days as this may be patient's primary herpes outbreak. Also ordered HIV, RPR, and urine G/C which patient will get done later this week when he comes back in for another appointment.    --valacyclovir 1000 mg BID  --herpes virology sent from penile lesion (unroofed vesicle at base of penis)  --further STI screen sent, to be done later this week.    RTC: as  needed    -------------------------------------------  Attending: Gershon Mussel, MD  I discussed the history, exam and medical decision making for this patient with Dr.  Anne Hahn. I was present in the clinic during the provisions of these services with no other clinical duties. I concur with the management plan as discussed.  -------------------------------------------

## 2013-03-18 ENCOUNTER — Encounter (HOSPITAL_BASED_OUTPATIENT_CLINIC_OR_DEPARTMENT_OTHER): Payer: Self-pay | Admitting: Internal Medicine

## 2013-03-18 ENCOUNTER — Encounter (HOSPITAL_BASED_OUTPATIENT_CLINIC_OR_DEPARTMENT_OTHER): Payer: Self-pay

## 2013-03-18 ENCOUNTER — Ambulatory Visit: Payer: No Typology Code available for payment source | Attending: Internal Medicine | Admitting: Internal Medicine

## 2013-03-18 VITALS — BP 98/58 | HR 68 | Wt 172.7 lb

## 2013-03-18 DIAGNOSIS — S29012D Strain of muscle and tendon of back wall of thorax, subsequent encounter: Secondary | ICD-10-CM

## 2013-03-18 DIAGNOSIS — Q249 Congenital malformation of heart, unspecified: Secondary | ICD-10-CM | POA: Insufficient documentation

## 2013-03-18 DIAGNOSIS — Z5189 Encounter for other specified aftercare: Secondary | ICD-10-CM | POA: Insufficient documentation

## 2013-03-18 DIAGNOSIS — Z7901 Long term (current) use of anticoagulants: Secondary | ICD-10-CM | POA: Insufficient documentation

## 2013-03-18 NOTE — Progress Notes (Signed)
Pt and his sister came to [facility] front desk asking to speak to a nurse.  I escorted pt and sister to available examination room and pt said he had recently contracted herpes simplex 2 and wanted to know if he should see a doctor, given his heart condition.  I told them once contracted, herpes simplex 2 was not curable, but could be treated with anti-viral medication and that treating the initial outbreak had been shown to be most effective.  I recommended he see his PCP; Dani told me then that he already had an appointment on 03/18/2013 with ARNP in clinic and would address herpes outbreak with her.

## 2013-03-18 NOTE — Progress Notes (Signed)
This note transcribed with voice recognition software. May contain errors.    [facility]     Chief Complaint:  Chief Complaint   Patient presents with   . Follow Up Visit    L& [place] [id number]  Mr. Wuthrich is a 27 year old year old English speaking male seen for a post hospitalization f/u.   [id number].    HPI: Pt went to work at 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 He loads heavy object onto arack called a dog bone (named by it's shape. ON Wednesday am about 7:30 in the am, he reports he tried to lift 2 dog bones w an estimated combined wt ov of over 100 lb. The following day, he was able to lift his arm on the L side and had acute pain. He was taken to [facility] for eval. They were concerned about his hx  congenital heart disease and transferred him to [facility]. See Cylinder in pt eval.   He was dx with a strain or possible tear of his latissimus dorsi muscles. To date date pt states no L&I claim has been opened.     After his hospitalization to eval for possible cardiac related chest pain, he went back to work on light duty on Tuesday June10th.  He is able to lift 20 pounds but not over head due to pain. He stopped taking ibuprofen and flexeril. Knows a muscle injury can take weeks. Reports had INR 3.1 on June 4th.     Patient Active Problem List    Diagnosis Date Noted   . Chronic anticoagulation 02/07/2013     [facility] ENROLLED     . Pacemaker 01/22/2012   . 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 CVA (cerebral vascular accident) 09/10/2011   . Sprain of ankle- Right 06/2011 06/16/2011       Current Outpatient Prescriptions   Medication Sig   . Acetaminophen 500 MG Oral Tab Take 2 tablet by mouth every 8 hours if needed to relieve pain   . COUMADIN 5 MG Oral Tab TAKE ONE TABLET BY MOUTH ON MONDAY AND THURSDAY AND 1 AND 1/2  TABLETS BY MOUTH ALL OTHER DAYS OR AS DIRECTED   . ValACYclovir HCl 500 MG Oral Tab 2 TABLETS 2 TIMES DAILY PO     No current facility-administered medications for this visit.       Physical Examination:   Filed Vitals:    03/18/13 1422   BP: 98/58   Pulse: 68   Weight: 172 lb 11.2 oz (78.336 kg)     Alert male in NAD unless moves.  He is a fair historian. He had trouble describing some aspects of his job requirements and trouble completing L&I form w/o assistance.  (Note: PCP Dr. Jimmie Molly states had cognitive changes after stroke.   MSK: no obvious swelling or bruising over chest or back. He is markedly tender at inferior aspects of insertion of lat dorsi on L side of chest wall. He is sig pain trying to raise L arm above shoulder height.   Chest: BS clear   Pacer L upper chest.     Assessment/Plan  (V58.61) Chronic anticoagulation      (746.9) Congenital heart disease    (V58.89) Strain of latissimus dorsi muscle, subsequent encounter after lifting  greater than 100 pound. I opened an L&I claim.   Letter given for light duty. ' Make a f/u appt wit Dr. Jimmie Molly, PCP .    He is not to lift greater than 20 pounds.

## 2013-03-19 LAB — CULTURE:HERPES GRP & FA
HSV and VZV FA Interpretation: POSITIVE
Herpes Simplex Virus FA: POSITIVE — AB
Varicella Zoster Virus FA: NEGATIVE

## 2013-03-29 ENCOUNTER — Encounter (HOSPITAL_BASED_OUTPATIENT_CLINIC_OR_DEPARTMENT_OTHER): Payer: Self-pay | Admitting: Internal Medicine

## 2013-03-29 NOTE — Progress Notes (Signed)
[employer] case number [id number].  I received a job description from workers Chief Technology Officer in [place]. Telephone number [phone]. Fax number [phone]. The Nature conservation officer is Sherri Rad.   I signed that this patient can perform job duties with modifications for accommodation due to injury including light-duty modifications as provided in the letter summarizing his essential duties and responsibilities. Specifically, he is able to frequently lift or move up to 10-15 pounds and occasionally lift or move up to 20 pounds with help. These accommodations are expected to be in effect until April 20, 2013 when he is scheduled to be reevaluated by his primary care provider Dr. Oren Bracket.

## 2013-03-31 ENCOUNTER — Telehealth (HOSPITAL_BASED_OUTPATIENT_CLINIC_OR_DEPARTMENT_OTHER): Payer: Self-pay

## 2013-04-05 ENCOUNTER — Encounter (HOSPITAL_BASED_OUTPATIENT_CLINIC_OR_DEPARTMENT_OTHER): Payer: Self-pay

## 2013-04-07 ENCOUNTER — Telehealth (HOSPITAL_BASED_OUTPATIENT_CLINIC_OR_DEPARTMENT_OTHER): Payer: Self-pay

## 2013-04-07 NOTE — Telephone Encounter (Signed)
Called and left a msg regarding missed appt on (03/31/13)- advised them to contact us for a future appt.

## 2013-04-12 ENCOUNTER — Ambulatory Visit
Admit: 2013-04-12 | Discharge: 2013-04-12 | Disposition: A | Discharge status: Home / Self Care | Payer: No Typology Code available for payment source | Attending: Pharmacotherapy | Admitting: Pharmacotherapy

## 2013-04-12 ENCOUNTER — Other Ambulatory Visit (HOSPITAL_BASED_OUTPATIENT_CLINIC_OR_DEPARTMENT_OTHER): Payer: Self-pay

## 2013-04-12 DIAGNOSIS — Z5181 Encounter for therapeutic drug level monitoring: Secondary | ICD-10-CM | POA: Insufficient documentation

## 2013-04-12 DIAGNOSIS — Z954 Presence of other heart-valve replacement: Secondary | ICD-10-CM | POA: Insufficient documentation

## 2013-04-12 DIAGNOSIS — Z7901 Long term (current) use of anticoagulants: Secondary | ICD-10-CM | POA: Insufficient documentation

## 2013-04-12 LAB — PROTHROMBIN TIME
Prothrombin INR: 3 — ABNORMAL HIGH (ref 0.8–1.3)
Prothrombin Time Patient: 30.5 s — ABNORMAL HIGH (ref 10.7–15.6)

## 2013-04-13 ENCOUNTER — Ambulatory Visit: Payer: No Typology Code available for payment source

## 2013-04-13 ENCOUNTER — Encounter (HOSPITAL_BASED_OUTPATIENT_CLINIC_OR_DEPARTMENT_OTHER): Payer: Self-pay | Admitting: Family

## 2013-04-13 DIAGNOSIS — Z8673 Personal history of transient ischemic attack (TIA), and cerebral infarction without residual deficits: Secondary | ICD-10-CM

## 2013-04-13 DIAGNOSIS — Z7901 Long term (current) use of anticoagulants: Secondary | ICD-10-CM

## 2013-04-13 DIAGNOSIS — Z952 Presence of prosthetic heart valve: Secondary | ICD-10-CM

## 2013-04-13 NOTE — Telephone Encounter (Signed)
ANTICOAGULATION TREATMENT PLAN    Indication:  MVR; recurrent CVA  Goal INR:  2.5-3.5  Duration of Therapy: chronic    Hemorrhagic Risk Score: 1  Warfarin Tablet Size: 5mg     Relevant Historic Information: CVA 2008; 2005; 2006    Referring Provider: Nadene Rubins    SUBJECTIVE:   Walter Rice was last seen in [facility] on 01/17/13 - his INR was 3.6 and he was instructed to continue warfarin 5mg  M/Th and 7.5mg  all other days. He failed his return visit on 02/18/13, and was subsequently admitted to [facility] on 03/09/13 for left-sided chest pain, determined to be musculoskeletal in etiology. He was due for INR follow up the end of June but missed this appointment. I contacted patient today regarding INR result from 7/8. 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 He reports he lifts sharp objects onto racks and frequently cuts himself. He has asked cardiology to write him a letter stating that his current job is dangerous given the fact he is on warfarin and has a heart condition. Patient would like to stay with current company but switch to a different job. He denies any other concerns with bleeding or bruising. Denies signs/sx of TIA/CVA. No acute illnesses. No changes in diet or activity level since last ACC visit.    OBJECTIVE:   Present dose: Warfarin 5mg  M/Th and 7.5mg  all other days. Pt denies omissions or errors in warfarin dosing.    Relevant medication changes: none     LABS:   INR      3.0   04/12/2013  INR      3.1   03/09/2013  INR      4.5   01/22/2012       ASSESSMENT:   Therapeutic INR on current warfarin dose.     PLAN:   1. Continue warfarin 5mg  M/Th and 7.5mg  all other days  2.  Return in 4 weeks (on 05/10/2013).     Encarnacion Chu PharmD, CACP

## 2013-04-16 ENCOUNTER — Other Ambulatory Visit (HOSPITAL_BASED_OUTPATIENT_CLINIC_OR_DEPARTMENT_OTHER): Payer: Self-pay | Admitting: Cardiovascular Disease

## 2013-04-16 DIAGNOSIS — Z7901 Long term (current) use of anticoagulants: Secondary | ICD-10-CM

## 2013-04-16 MED ORDER — COUMADIN 5 MG OR TABS
ORAL_TABLET | ORAL | Status: DC
Start: 2013-04-16 — End: 2013-05-11

## 2013-04-16 NOTE — Telephone Encounter (Signed)
Refill Requested via [employer], Lf 6/14  Indication: MVR; recurrent CVA   Goal INR: 2.5-3.5   Duration of Therapy: chronic       Last refill entry:  COUMADIN 5 MG Oral Tab Sig: TAKE ONE TABLET BY MOUTH ON MONDAY AND THURSDAY AND 1 AND 1/2 TABLETS BY MOUTH ALL OTHER DAYS OR AS DIRECTED  LABS:\  INR 3.0 04/12/2013   INR 3.1 03/09/2013\   INR 4.5 01/22/2012       Med list reviewed by ACC: 7/14  Continue warfarin 5mg  M/Th and 7.5mg  all other days   2. Return in 4 weeks (on 05/10/2013).       Comment/Action: Rf x 2

## 2013-04-20 ENCOUNTER — Encounter (HOSPITAL_BASED_OUTPATIENT_CLINIC_OR_DEPARTMENT_OTHER): Payer: No Typology Code available for payment source | Admitting: Internal Medicine

## 2013-04-26 ENCOUNTER — Telehealth (HOSPITAL_BASED_OUTPATIENT_CLINIC_OR_DEPARTMENT_OTHER): Payer: Self-pay | Admitting: Internal Medicine

## 2013-04-26 NOTE — Telephone Encounter (Signed)
Letter from L&I asking for updated patient address was found in MA box, with a note from Walter Rice asking "Please call pt and update L&I with current address. Thanks, Google pt, got updated address of:  [address] [place], [place] [address]    Pt does not want this updated as his mailing address in our system- he will only be living there a little while and the address we have belongs to his aunt who owns the house.     Wrote letter in [employer] and sent to L&I, as they specifically requested to be contacted in writing.     Closing encounter.

## 2013-04-27 ENCOUNTER — Telehealth (HOSPITAL_BASED_OUTPATIENT_CLINIC_OR_DEPARTMENT_OTHER): Payer: Self-pay | Admitting: Family

## 2013-04-27 NOTE — Telephone Encounter (Signed)
I spoke with Walter Rice,  Walter Rice's aunt and she informed me that he is living with his girlfriend in [place], [place] and commutes by bus to [place] daily to work at [employer]. She(the girlfriend) works there too and accompanies him on the bus daily which is an extra protection for him as she helps him get organized and is in a sense a kind of chaperone for him.  His work place was asking to move him to a swing shift and I had written a letter saying that he should be limited to day hours only. I spoke with Walter Rice [phone] at his work place who has known him from the beginning of his time working and she agreed to keep him on the day shift on the grounds that I stated in my letter that this is medically better for his heart in that it would mean regular work hours with less stress traveling to and from work and promoting better sleep and restoration. The Human resources person that Walter Gilbert, RN spoke with last week was [facility] [phone] as I had entertained whether swing shift might be a viable option for him but ultimately decided otherwise after speaking with his Aunt and looking at his long commute home late at night.

## 2013-05-03 ENCOUNTER — Other Ambulatory Visit (HOSPITAL_BASED_OUTPATIENT_CLINIC_OR_DEPARTMENT_OTHER): Payer: Self-pay | Admitting: Family

## 2013-05-03 DIAGNOSIS — Q2121 Partial atrioventricular septal defect: Secondary | ICD-10-CM

## 2013-05-03 DIAGNOSIS — Z950 Presence of cardiac pacemaker: Secondary | ICD-10-CM

## 2013-05-03 DIAGNOSIS — Z952 Presence of prosthetic heart valve: Secondary | ICD-10-CM

## 2013-05-09 ENCOUNTER — Other Ambulatory Visit (HOSPITAL_BASED_OUTPATIENT_CLINIC_OR_DEPARTMENT_OTHER): Payer: Self-pay

## 2013-05-09 DIAGNOSIS — Z952 Presence of prosthetic heart valve: Secondary | ICD-10-CM

## 2013-05-09 DIAGNOSIS — Z8673 Personal history of transient ischemic attack (TIA), and cerebral infarction without residual deficits: Secondary | ICD-10-CM

## 2013-05-09 DIAGNOSIS — Z7901 Long term (current) use of anticoagulants: Secondary | ICD-10-CM

## 2013-05-10 ENCOUNTER — Telehealth (HOSPITAL_BASED_OUTPATIENT_CLINIC_OR_DEPARTMENT_OTHER): Payer: Self-pay

## 2013-05-10 ENCOUNTER — Ambulatory Visit
Admit: 2013-05-10 | Discharge: 2013-05-10 | Disposition: A | Discharge status: Home / Self Care | Payer: No Typology Code available for payment source | Attending: Pharmacist | Admitting: Pharmacist

## 2013-05-10 DIAGNOSIS — Z8673 Personal history of transient ischemic attack (TIA), and cerebral infarction without residual deficits: Secondary | ICD-10-CM | POA: Insufficient documentation

## 2013-05-10 DIAGNOSIS — Z7901 Long term (current) use of anticoagulants: Secondary | ICD-10-CM | POA: Insufficient documentation

## 2013-05-10 DIAGNOSIS — Z954 Presence of other heart-valve replacement: Secondary | ICD-10-CM | POA: Insufficient documentation

## 2013-05-10 LAB — PROTHROMBIN TIME
Prothrombin INR: 2.5 — ABNORMAL HIGH (ref 0.8–1.3)
Prothrombin Time Patient: 26.9 s — ABNORMAL HIGH (ref 10.7–15.6)

## 2013-05-11 ENCOUNTER — Ambulatory Visit: Payer: No Typology Code available for payment source

## 2013-05-11 DIAGNOSIS — Z7901 Long term (current) use of anticoagulants: Secondary | ICD-10-CM

## 2013-05-11 DIAGNOSIS — Z8673 Personal history of transient ischemic attack (TIA), and cerebral infarction without residual deficits: Secondary | ICD-10-CM

## 2013-05-11 DIAGNOSIS — Z952 Presence of prosthetic heart valve: Secondary | ICD-10-CM

## 2013-05-11 MED ORDER — COUMADIN 5 MG OR TABS
ORAL_TABLET | ORAL | Status: DC
Start: 2013-05-11 — End: 2013-06-15

## 2013-05-11 NOTE — Telephone Encounter (Signed)
ANTICOAGULATION TREATMENT PLAN    Indication: MVR; recurrent CVA  Goal INR: 2.5-3.5  Duration of Therapy: chronic    Hemorrhagic Risk Score: 1  Warfarin Tablet Size: 5mg     Relevant Historic Information: CVA 2008; 2005; 2006    Referring Provider: Nadene Rubins    SUBJECTIVE:  Patient denies bruising or bleeding.  He denies signs or symptoms of stroke or TIA.  He denies acute illness or changes in overall health.    He reports consistent level of activity.  He reports consistent dietary intake of vitamin K.    ANTICOAGULANT DOSE:   Warfarin 5mg  Mon, Thurs and 7.5mg  all other days (47.5mg /week).    He reports omitting his warfarin dose last night as he did not receive a call from [facility] (due to INR resulting after 1700), and took his 8/5 dose this morning.    He denies extra doses and correctly recites above regimen.    He requests to repeat his INR at next [facility] appointment which is 7 weeks away.    OBJECTIVE:  He denies a change in prescription medications or over-the-counter therapies.    LABS:   INR      2.5   05/10/2013  INR      3.0   04/12/2013  INR      3.1   03/09/2013  INR      3.6   01/17/2013  INR      4.5   01/22/2012    ASSESSMENT:   Therapeutic INR, however at the low end of goal range without a clear etiology.    Although patient has been somewhat stable on current dose, given high thrombotic risk, it is reasonable to continue current dose and decrease frequency of INR monitoring.    PLAN:   1) CONTINUE warfarin at 5mg  Mon, Thurs and 7.5mg  all other days    2) Return in 3 weeks (on 05/31/2013).    Duncan Dull, PharmD

## 2013-05-12 ENCOUNTER — Encounter (HOSPITAL_BASED_OUTPATIENT_CLINIC_OR_DEPARTMENT_OTHER): Payer: Self-pay | Admitting: Pediatrics

## 2013-05-31 ENCOUNTER — Telehealth (HOSPITAL_BASED_OUTPATIENT_CLINIC_OR_DEPARTMENT_OTHER): Payer: Self-pay

## 2013-05-31 ENCOUNTER — Other Ambulatory Visit (HOSPITAL_BASED_OUTPATIENT_CLINIC_OR_DEPARTMENT_OTHER): Payer: Self-pay

## 2013-05-31 ENCOUNTER — Ambulatory Visit
Admit: 2013-05-31 | Discharge: 2013-05-31 | Disposition: A | Discharge status: Home / Self Care | Payer: No Typology Code available for payment source | Attending: Pharmacist | Admitting: Pharmacist

## 2013-05-31 DIAGNOSIS — Z7901 Long term (current) use of anticoagulants: Secondary | ICD-10-CM | POA: Insufficient documentation

## 2013-05-31 DIAGNOSIS — Z8673 Personal history of transient ischemic attack (TIA), and cerebral infarction without residual deficits: Secondary | ICD-10-CM | POA: Insufficient documentation

## 2013-05-31 DIAGNOSIS — Z952 Presence of prosthetic heart valve: Secondary | ICD-10-CM

## 2013-05-31 DIAGNOSIS — Z954 Presence of other heart-valve replacement: Secondary | ICD-10-CM | POA: Insufficient documentation

## 2013-05-31 LAB — PROTHROMBIN TIME
Prothrombin INR: 2.5 — ABNORMAL HIGH (ref 0.8–1.3)
Prothrombin Time Patient: 26.8 s — ABNORMAL HIGH (ref 10.7–15.6)

## 2013-06-01 ENCOUNTER — Ambulatory Visit: Payer: No Typology Code available for payment source

## 2013-06-01 DIAGNOSIS — Z7901 Long term (current) use of anticoagulants: Secondary | ICD-10-CM

## 2013-06-01 DIAGNOSIS — Z952 Presence of prosthetic heart valve: Secondary | ICD-10-CM

## 2013-06-01 DIAGNOSIS — Z8673 Personal history of transient ischemic attack (TIA), and cerebral infarction without residual deficits: Secondary | ICD-10-CM

## 2013-06-01 NOTE — Telephone Encounter (Signed)
ANTICOAGULATION TREATMENT PLAN    Indication: MVR; recurrent CVA  Goal INR: 2.5-3.5  Duration of Therapy: chronic    Hemorrhagic Risk Score: 1  Warfarin Tablet Size: 5mg     Relevant Historic Information: CVA 2008; 2005; 2006    Referring Provider: Nadene Rubins    SUBJECTIVE:   I spoke with Walter Rice over the phone.  Pt denies any signs or symptoms of CVA.  Patient denies any unusual bleeding or bruising since last [facility] encounter.  No recent illnesses.  He has lettuce once weekly as his vegetable intake.  No change in activity level.      OBJECTIVE:   Present warfarin dose: Warfarin 5mg  MON+THU and 7.5mg  others (no dosing errors)      Relevant medication changes: None      LABS:   INR      2.5   05/31/2013  INR      2.5   05/10/2013  INR      3.0   04/12/2013  INR      3.1   03/09/2013  INR      4.5   01/22/2012    ASSESSMENT:   INR at lower end of target range of 2.5 to 3.5, second consecutive INR at lower end of the range.  I would prefer INR be closer to midrange due to pt's relatively low hemorrhagic risk.    PLAN:   1) INCREASE warfarin dose to 5mg  MON and 7.5mg  others or 50mg /week.    2) Patient is in agreement and verbally expressed understanding of the plan.  Plan also emailed to pt's sister and aunt.    3) Return in 2 weeks (on 06/14/2013).    [name], PharmD

## 2013-06-07 ENCOUNTER — Telehealth (HOSPITAL_BASED_OUTPATIENT_CLINIC_OR_DEPARTMENT_OTHER): Payer: Self-pay | Admitting: Internal Medicine

## 2013-06-07 NOTE — Telephone Encounter (Signed)
LM asking for RTC from claims manager Reuel Boom webb: [phone]. Please explain why this episode ins not covered under L&I. LM re: pt does not have the mental capacity to navigate L&I (hx stroke w cognitive impairment). PCP is Dr. Jimmie Molly.

## 2013-06-08 NOTE — Telephone Encounter (Signed)
RTC to Mr Hyman Hopes at L&I.  He said the pt wrote "hurt my ribs" on the L&I form and that was  unacceptable in terms of L&I's required documentation which requires a description of the event, date, and time. I have advised him that the pt has a disability and cannot be expected to communicate effectively with L&I forms. I asked him to review my notes as well as the ED. Notes. Reminded L&I of right of disabled and that alternative methods to collect history are warranted. Mr. Hyman Hopes will review the claim again in light of patient's disability.

## 2013-06-14 ENCOUNTER — Telehealth (HOSPITAL_BASED_OUTPATIENT_CLINIC_OR_DEPARTMENT_OTHER): Payer: Self-pay

## 2013-06-14 ENCOUNTER — Other Ambulatory Visit (HOSPITAL_BASED_OUTPATIENT_CLINIC_OR_DEPARTMENT_OTHER): Payer: Self-pay

## 2013-06-14 ENCOUNTER — Ambulatory Visit
Admit: 2013-06-14 | Discharge: 2013-06-14 | Disposition: A | Discharge status: Home / Self Care | Payer: No Typology Code available for payment source | Attending: Pharmacist | Admitting: Pharmacist

## 2013-06-14 DIAGNOSIS — Z8673 Personal history of transient ischemic attack (TIA), and cerebral infarction without residual deficits: Secondary | ICD-10-CM

## 2013-06-14 DIAGNOSIS — Z954 Presence of other heart-valve replacement: Secondary | ICD-10-CM | POA: Insufficient documentation

## 2013-06-14 DIAGNOSIS — Z7901 Long term (current) use of anticoagulants: Secondary | ICD-10-CM | POA: Insufficient documentation

## 2013-06-14 DIAGNOSIS — Z952 Presence of prosthetic heart valve: Secondary | ICD-10-CM

## 2013-06-14 LAB — PROTHROMBIN TIME
Prothrombin INR: 2.6 — ABNORMAL HIGH (ref 0.8–1.3)
Prothrombin Time Patient: 27.3 s — ABNORMAL HIGH (ref 10.7–15.6)

## 2013-06-15 ENCOUNTER — Ambulatory Visit: Payer: No Typology Code available for payment source

## 2013-06-15 DIAGNOSIS — Z952 Presence of prosthetic heart valve: Secondary | ICD-10-CM

## 2013-06-15 DIAGNOSIS — Z8673 Personal history of transient ischemic attack (TIA), and cerebral infarction without residual deficits: Secondary | ICD-10-CM

## 2013-06-15 DIAGNOSIS — Z7901 Long term (current) use of anticoagulants: Secondary | ICD-10-CM

## 2013-06-15 MED ORDER — WARFARIN SODIUM 5 MG OR TABS
ORAL_TABLET | ORAL | Status: DC
Start: 2013-06-15 — End: 2014-05-18

## 2013-06-15 NOTE — Telephone Encounter (Signed)
ANTICOAGULATION TREATMENT PLAN    Indication: MVR; recurrent CVA  Goal INR: 2.5-3.5  Duration of Therapy: chronic    Hemorrhagic Risk Score: 1  Warfarin Tablet Size: 5mg     Relevant Historic Information: CVA 2008; 2005; 2006    Referring Provider: Nadene Rubins    SUBJECTIVE:  Pt denies any unusual bruising or bleeding, and denies signs/sxs of TIA/stroke. No changes reported to diet or activity level since last [facility] visit. No changes to his general health reported.    WARFARIN DOSE:   Warfarin 5mg  Mon, and 7.5mg  on all others, increased from 47.5mg /week on 8/27 (INR on 8/26=2.5).   No missed or extra doses reported.    OBJECTIVE:  No changes in prescription medications or over-the-counter therapies.     LABS:   INR      2.6   06/14/2013  INR      2.5   05/31/2013  INR      2.5   05/10/2013  INR      4.5   01/22/2012      ASSESSMENT:   Therapeutic INR with warfarin dose increase made at last visit. Will give current dose more time. If INR drops, will likely need to further increase his maintenance dose.     PLAN:   1. Continue current warfarin dose of 5mg  Mondays, 7.5mg  on all other days (50mg /week).   2. Return in 2 weeks (on 06/30/2013) At [facility] to coincide with appt.  3. Pt. expressed understanding of plan outlined above.    Alfred Levins, PharmD

## 2013-06-26 DIAGNOSIS — I4729 Other ventricular tachycardia: Secondary | ICD-10-CM | POA: Insufficient documentation

## 2013-06-26 DIAGNOSIS — I495 Sick sinus syndrome: Secondary | ICD-10-CM | POA: Insufficient documentation

## 2013-06-26 DIAGNOSIS — R519 Headache, unspecified: Secondary | ICD-10-CM | POA: Insufficient documentation

## 2013-06-26 DIAGNOSIS — Q212 Atrioventricular septal defect, unspecified as to partial or complete: Secondary | ICD-10-CM | POA: Insufficient documentation

## 2013-06-29 ENCOUNTER — Other Ambulatory Visit (HOSPITAL_BASED_OUTPATIENT_CLINIC_OR_DEPARTMENT_OTHER): Payer: Self-pay

## 2013-06-29 DIAGNOSIS — Z7901 Long term (current) use of anticoagulants: Secondary | ICD-10-CM

## 2013-06-29 DIAGNOSIS — Z8673 Personal history of transient ischemic attack (TIA), and cerebral infarction without residual deficits: Secondary | ICD-10-CM

## 2013-06-29 DIAGNOSIS — Z952 Presence of prosthetic heart valve: Secondary | ICD-10-CM

## 2013-06-30 ENCOUNTER — Ambulatory Visit (HOSPITAL_BASED_OUTPATIENT_CLINIC_OR_DEPARTMENT_OTHER): Payer: No Typology Code available for payment source

## 2013-06-30 ENCOUNTER — Encounter (HOSPITAL_BASED_OUTPATIENT_CLINIC_OR_DEPARTMENT_OTHER): Payer: Self-pay | Admitting: Family

## 2013-06-30 ENCOUNTER — Encounter (HOSPITAL_BASED_OUTPATIENT_CLINIC_OR_DEPARTMENT_OTHER): Payer: Self-pay | Admitting: Pediatrics

## 2013-06-30 ENCOUNTER — Ambulatory Visit: Payer: No Typology Code available for payment source | Attending: Cardiovascular Disease | Admitting: Pediatrics

## 2013-06-30 ENCOUNTER — Ambulatory Visit (HOSPITAL_BASED_OUTPATIENT_CLINIC_OR_DEPARTMENT_OTHER): Payer: No Typology Code available for payment source | Admitting: Family

## 2013-06-30 VITALS — HR 66 | Ht 72.0 in | Wt 172.0 lb

## 2013-06-30 VITALS — BP 106/58 | HR 66 | Ht 72.0 in | Wt 172.8 lb

## 2013-06-30 DIAGNOSIS — Z7901 Long term (current) use of anticoagulants: Secondary | ICD-10-CM | POA: Insufficient documentation

## 2013-06-30 DIAGNOSIS — Z952 Presence of prosthetic heart valve: Secondary | ICD-10-CM

## 2013-06-30 DIAGNOSIS — Q212 Atrioventricular septal defect, unspecified as to partial or complete: Secondary | ICD-10-CM

## 2013-06-30 DIAGNOSIS — Z8673 Personal history of transient ischemic attack (TIA), and cerebral infarction without residual deficits: Secondary | ICD-10-CM

## 2013-06-30 DIAGNOSIS — Z954 Presence of other heart-valve replacement: Secondary | ICD-10-CM

## 2013-06-30 DIAGNOSIS — Q2123 Complete atrioventricular septal defect: Secondary | ICD-10-CM

## 2013-06-30 DIAGNOSIS — I359 Nonrheumatic aortic valve disorder, unspecified: Secondary | ICD-10-CM

## 2013-06-30 DIAGNOSIS — I495 Sick sinus syndrome: Secondary | ICD-10-CM | POA: Insufficient documentation

## 2013-06-30 DIAGNOSIS — Z950 Presence of cardiac pacemaker: Secondary | ICD-10-CM | POA: Insufficient documentation

## 2013-06-30 LAB — PROTHROMBIN TIME
Prothrombin INR: 3.3 — ABNORMAL HIGH (ref 0.8–1.3)
Prothrombin Time Patient: 32.8 s — ABNORMAL HIGH (ref 10.7–15.6)

## 2013-06-30 NOTE — Progress Notes (Signed)
ANTICOAGULATION TREATMENT PLAN    Indication: MVR; recurrent CVA  Goal INR: 2.5-3.5  Duration of Therapy: chronic    Hemorrhagic Risk Score: 1  Warfarin Tablet Size: 5mg     Relevant Historic Information: CVA 2008; 2005; 2006    Referring Provider: Nadene Rubins    SUBJECTIVE:   Mr. Walter Rice comes to clinic for anticoagulation assessment accompanied by Tiffany.  He gives [name] verbal authorization to discuss his anticoagulation care with Tiffany.    Pt denies any unusual bleeding or bruising.  No health changes.  Pt denies any signs or symptoms of CVA.  No change in vegetable intake or activity level.  Pt recently moved.      OBJECTIVE:   Present warfarin dose: Warfarin 5mg  MON and 7.5mg  others (no dosing errors)      Relevant medication changes: None      LABS:   INR      3.3   06/30/2013  INR      2.6   06/14/2013  INR      2.5   05/31/2013  INR      2.5   05/10/2013  INR      3.0   04/12/2013      ASSESSMENT:   INR within target range of 2.5 to 3.5 in pt who appears to be tolerating warfarin therapy without significant problems    PLAN:   1) CONTINUE warfarin 5mg  MON and 7.5mg  others    2) Return in 3 weeks (on 07/22/2013).    3) Patient is in agreement and verbally expressed understanding of the plan.    [name], PharmD

## 2013-06-30 NOTE — Patient Instructions (Signed)
Follow up with Dr Nadene Rubins in one year with Echo

## 2013-06-30 NOTE — Progress Notes (Addendum)
ADULT CONGENITAL  ELECTROPHYSIOLOGY CLINIC NOTE       PRIMARY CARE PROVIDER     Dedra Skeens, MD.    PRIMARY CARDIOLOGIST     Nadene Rubins, MD.    REASON FOR VISIT     Scheduled followup visit for pacemaker check and arrhythmia follow up in the setting of repaired congenital heart disease.    PROBLEM LIST     1. 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 A) Status post repair with ventricular septal defect closure in February 1989.  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 Jude prosthetic valve in 1993.  2. History of cerebrovascular accident in 2005, 2006, and 2008 resulting from compliance issues with anticoagulation therapy. A) Resultant for short-term memory. 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  3. Sinus node dysfunction.  A) Status post Medtronic dual-chamber permanent pacemaker.  4. Transient postoperative heart block -- recovered.  5. Nonsustained ventricular tachycardia detected on prior device interrogations.    HISTORY OF PRESENT ILLNESS     Walter Rice is a 27 year old with the above cardiovascular conditions.  He returns today for scheduled followup.  I last saw him in March of 2014.  In the interim since that visit, he has done well.  He has no specific rhythm or device-related complaints.  He denies palpitations, chest pain, or dizziness.  He has had no symptoms concerning for stroke since our last visit. He will also be seeing the Adult Congenital Heart Disease team today and having an echocardiogram performed. The big news today was that he is engaged to be married.     MEDICATIONS     Current Outpatient Prescriptions   Medication Sig   . Acetaminophen 500 MG Oral Tab Take 2 tablet by mouth every 8 hours if needed to relieve pain   . Cyclobenzaprine HCl 5 MG Oral Tab 5 mg by mouth three times daily as needed Muscle Spasm.   . Lidocaine 5 % External Patch (topical film) 1 patch Transdermal Daily.   .  Warfarin Sodium 5 MG Oral Tab Take one tablet (5mg ) by mouth every Monday, and take 1&1/2 tablets (7.5mg ) on all other days or as directed by [facility].     No current facility-administered medications for this visit.       ALLERGIES     Review of patient's allergies indicates:  Allergies   Allergen Reactions   . Sulfa Antibiotics Rash     SOCIAL HISTORY  Walter Rice came today with his fiance. They have not yet set a wedding date.     PHYSICAL EXAMINATION     Filed Vitals:    06/30/13 0911   BP: 106/58   Pulse: 66   Height: 6' (1.829 m)   Weight: 172 lb 12.8 oz (78.382 kg)   SpO2: 96%     GENERAL:  He was alert, pleasant, cooperative, well-appearing young man in no distress.  CHEST: His pacemaker site in the left upper chest was well healed.     DIAGNOSTIC TESTING     A complete interrogation of his Medtronic Adapta model ADDR01 was performed. The current indication for pacemaker is sinus node dysfunction.  The ventricular lead impedance is stable. There continues to be a decline in the atrial lead impedance. The threshold in the atrial lead is stable but high at 1.5 V at 0.5 msec.  The ventricular lead threshold stable was 1 V at 0.4 msec.There were no high rate ventricular or  atrial episodes. The remainder of the device interrogation was unremarkable.  CIED 06/30/2013   Type PPM   Manufacturer Medtronic   Generator Model Adapta L   Date of Generator Implant 10/28/2006   Generator Serial Number [id number]   Lead #1 Type Atrial   Model # [id number]   Lead #1 Serial # [id number]   Lead #1 Implant Date 11/17/1997   Lead #2 Type RV   Lead #2 Model # [id number]   Lead #2 Serial # [id number]   Lead #2 Implant Date 11/17/1997   Battery Voltage 2.76V   P-Wave 0   R-Wave 11.2   Atrial impedence 377   RV impedence 1495   Atrial Threshold 1.5V@0 .52   RV Threshold 1V@0 .4   Atrial Events 1 MS   % atrial pacing 100   % ventricular pacing 0.3   Mode AAIR<=>DDDR 60-180         ASSESSMENT AND PLAN     Walter Rice is a 27 year old with repaired  congenital heart disease and profound sinus node dysfunction. He continues to demonstrate a ventricular escape rate in the 30s to low 40s.  His device appears stable, though I did discuss the fact that the atrial lead appears to be showing signs of aging and that consideration will be given to replacing it at the time of the next generator change.   He is currently on no antiarrhythmic medication.  Walter Rice should return in 6 months' time for repeat device check.     I spent over 20 minutes in the care of this patient today, with over 50% of that time spent in care coordination and counseling
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
## 2013-07-22 ENCOUNTER — Telehealth (HOSPITAL_BASED_OUTPATIENT_CLINIC_OR_DEPARTMENT_OTHER): Payer: Self-pay

## 2013-07-22 ENCOUNTER — Other Ambulatory Visit (HOSPITAL_BASED_OUTPATIENT_CLINIC_OR_DEPARTMENT_OTHER): Payer: Self-pay

## 2013-07-22 ENCOUNTER — Ambulatory Visit
Admit: 2013-07-22 | Discharge: 2013-07-22 | Disposition: A | Discharge status: Home / Self Care | Payer: No Typology Code available for payment source | Attending: Pharmacist | Admitting: Pharmacist

## 2013-07-22 DIAGNOSIS — Z7901 Long term (current) use of anticoagulants: Secondary | ICD-10-CM

## 2013-07-22 DIAGNOSIS — Z952 Presence of prosthetic heart valve: Secondary | ICD-10-CM

## 2013-07-22 DIAGNOSIS — Z8673 Personal history of transient ischemic attack (TIA), and cerebral infarction without residual deficits: Secondary | ICD-10-CM

## 2013-07-22 DIAGNOSIS — Z954 Presence of other heart-valve replacement: Secondary | ICD-10-CM | POA: Insufficient documentation

## 2013-07-22 LAB — PROTHROMBIN TIME
Prothrombin INR: 3.5 — ABNORMAL HIGH (ref 0.8–1.3)
Prothrombin Time Patient: 34.6 — ABNORMAL HIGH (ref 10.7–15.6)

## 2013-07-25 ENCOUNTER — Ambulatory Visit: Payer: No Typology Code available for payment source

## 2013-07-25 DIAGNOSIS — Z8673 Personal history of transient ischemic attack (TIA), and cerebral infarction without residual deficits: Secondary | ICD-10-CM

## 2013-07-25 DIAGNOSIS — Z7901 Long term (current) use of anticoagulants: Secondary | ICD-10-CM

## 2013-07-25 DIAGNOSIS — Z952 Presence of prosthetic heart valve: Secondary | ICD-10-CM

## 2013-07-25 NOTE — Telephone Encounter (Signed)
ANTICOAGULATION TREATMENT PLAN    Indication: MVR; recurrent CVA  Goal INR: 2.5-3.5  Duration of Therapy: chronic    Hemorrhagic Risk Score: 1  Warfarin Tablet Size: 5mg     Relevant Historic Information: CVA 2008; 2005; 2006    Referring Provider: Nadene Rubins    SUBJECTIVE:   No unusual bruising/bleeding. Diet consistent. Pt took all prescribed doses. No acute illness or signs/sx of stroke noted by patient.  Activity level stable.  Green vegetable intake averages 1 serving a week.  He does not consume green tea.        Present dose: Warfarin 5mg  Mon, 7.5mg  all other days of the week (50mg /wk)    OBJECTIVE:     Relevant medication changes: None noted by pt.  Med list updated/reviewed with patient today.      LABS:   INR      3.5   07/22/2013  INR      3.3   06/30/2013  INR      2.6   06/14/2013  INR      4.5   01/22/2012      ASSESSMENT:   INR therapeutic in otherwise stable patient without any warfarin related concerns or events.    As no reported changes in diet, lifestyle, or medications since last ACC assessment, it is reasonable to continue current maintenance dose and continue frequency of INR monitoring.    PLAN:   1. Continue warfarin 5mg  Mon, 7.5mg  all other days of the week    2. Return in 4 weeks (on 08/22/2013).  INR @ [place].        Terence Lux, PharmD, CACP

## 2013-08-01 ENCOUNTER — Emergency Department
Admission: EM | Admit: 2013-08-01 | Discharge: 2013-08-01 | Disposition: A | Discharge status: Home / Self Care | Payer: No Typology Code available for payment source | Attending: Emergency Medicine | Admitting: Emergency Medicine

## 2013-08-01 DIAGNOSIS — S8990XA Unspecified injury of unspecified lower leg, initial encounter: Secondary | ICD-10-CM

## 2013-08-01 DIAGNOSIS — IMO0002 Reserved for concepts with insufficient information to code with codable children: Secondary | ICD-10-CM | POA: Insufficient documentation

## 2013-08-01 DIAGNOSIS — S93409A Sprain of unspecified ligament of unspecified ankle, initial encounter: Secondary | ICD-10-CM | POA: Insufficient documentation

## 2013-08-01 DIAGNOSIS — S99919A Unspecified injury of unspecified ankle, initial encounter: Secondary | ICD-10-CM | POA: Insufficient documentation

## 2013-08-02 ENCOUNTER — Encounter (HOSPITAL_BASED_OUTPATIENT_CLINIC_OR_DEPARTMENT_OTHER): Payer: Self-pay | Admitting: Pharmacist

## 2013-08-02 NOTE — Progress Notes (Signed)
Pt evaluated in ER at [facility] 08/01/13 for left foot/ankle pain.      INR not measured while at the ER.     Pt discharged home with oxycodone for pain.     Did not contact patient today.    Keep scheduled appointment for next INR 08/22/13.

## 2013-08-04 ENCOUNTER — Emergency Department
Admission: EM | Admit: 2013-08-04 | Discharge: 2013-08-04 | Disposition: A | Discharge status: Home / Self Care | Payer: No Typology Code available for payment source | Attending: Emergency Medicine | Admitting: Emergency Medicine

## 2013-08-05 ENCOUNTER — Encounter (HOSPITAL_BASED_OUTPATIENT_CLINIC_OR_DEPARTMENT_OTHER): Payer: Self-pay | Admitting: Internal Medicine

## 2013-08-05 ENCOUNTER — Other Ambulatory Visit: Payer: Self-pay

## 2013-08-05 NOTE — Telephone Encounter (Signed)
CONFIRMED PHONE NUMBER: [phone]  CALLERS FIRST AND LAST NAME: [name]  FACILITY NAME: N/A TITLE: N/A  CALLERS RELATIONSHIP:Self  RETURN CALL: Detailed message on voicemail only     SUBJECT: General Message   REASON FOR REQUEST: Patient called in stating that he needs a closed boot to return to work. He states that his boot is open and his job will not let him return. Please contact the patient to discuss a closed boot or provide any recommendations.     MESSAGE: Please see above and contact the patient as soon as possible

## 2013-08-08 ENCOUNTER — Ambulatory Visit: Payer: No Typology Code available for payment source | Attending: Internal Medicine | Admitting: Internal Medicine

## 2013-08-08 ENCOUNTER — Encounter (HOSPITAL_BASED_OUTPATIENT_CLINIC_OR_DEPARTMENT_OTHER): Payer: No Typology Code available for payment source | Admitting: Internal Medicine

## 2013-08-08 ENCOUNTER — Encounter (HOSPITAL_BASED_OUTPATIENT_CLINIC_OR_DEPARTMENT_OTHER): Payer: Self-pay | Admitting: Internal Medicine

## 2013-08-08 VITALS — BP 100/76 | HR 64 | Wt 175.4 lb

## 2013-08-08 DIAGNOSIS — Z23 Encounter for immunization: Secondary | ICD-10-CM | POA: Insufficient documentation

## 2013-08-08 DIAGNOSIS — S93409D Sprain of unspecified ligament of unspecified ankle, subsequent encounter: Secondary | ICD-10-CM

## 2013-08-08 DIAGNOSIS — Z5189 Encounter for other specified aftercare: Secondary | ICD-10-CM | POA: Insufficient documentation

## 2013-08-08 NOTE — Patient Instructions (Signed)
--   Please use the air cast with ace wrap in conjunction with your regular closed-toe work boot  -- We have administered vaccinations against tetanus and influenza

## 2013-08-08 NOTE — Progress Notes (Signed)
Flu Vaccine Screening Questionnaire:    1)  Is the person to be vaccinated sick today?  NO    2)  Does the person to be vaccinated have an allergy to eggs or to a component of the vaccine?  NO    3)  Has the person to be vaccinated ever had a serious reaction to a flu vaccine or another vaccine in the past? NO    4) Has the person to be vaccinated ever had Guillain-Barr syndrome? NO      If YES to any of the questions above - NO Flu Vaccine to be given.  Patient may consult provider as needed.    If NO to all questions above - Patient may receive Flu Shot (IM)      Immunization Documentation:    Patient/Parent has reviewed the appropriate VIS and has all questions answered? YES    Vaccine(s) given today without initial adverse effect? YES    Walter Rice H [name]

## 2013-08-08 NOTE — Progress Notes (Signed)
[facility]     Chief Complaint:  -- L ankle sprain    Walter Rice is a 27y/o English-speaking gentleman with h/o mechanical mitral valve replacement 2/2 congenital valve condition, chronically anti-coagulated, and s/p permanent pacemaker who presents for f/u of L foot injury. Pt's PCP is Walter Rice.    Issues Discussed:   # L ankle sprain - Pt was cutting down some trees with a chainsaw about 2wks ago when he heard a loud pop in his L foot. The following day, his foot became more irritated and painful, described as what looked like a bruise on the lateral aspect and a burning sensation shooting up the calf part way. Has been seen 2x in the [facility] ED, initially on 10/28 and again on 10/30, where he was ruled out for Achilles tendon rupture, septic arthritis, fracture, DVT. Diagnosed with ankle sprain and sent home with ACE wrap, air cast, open-toed walking boot, and crutches. At this time, no longer needs crutches and says pain markedly improved. However, needs closed-toed footwear to be allowed back to work. Has tried walking around in just ACE wrap in a regular walking shoe but the pain returns making him "wish he had the boot". Says he has the air cast but has never worn it, just the ACE wrap and walking boot.    # Healthcare maintenance - Due for annual influenza vaccine. Not clear when last tetanus booster was.    Problem List:  Patient Active Problem List   Diagnosis   . Sprain of ankle- Right 06/2011   . Congenital heart disease   . CVA (cerebral vascular accident)   . Pacemaker   . Chronic anticoagulation   . Mitral valve replaced   . History of CVA (cerebrovascular accident)   . Atrioventricular septal defect   . Sinus node dysfunction   . Transient complete heart block   . Nonsustained ventricular tachycardia   . Headache     Medications Discharged On:  Current Outpatient Prescriptions   Medication Sig   . Acetaminophen 500 MG Oral Tab Take 2 tablet by mouth every 8 hours if needed  to relieve pain   . Cyclobenzaprine HCl 5 MG Oral Tab 5 mg by mouth three times daily as needed Muscle Spasm.   . Lidocaine 5 % External Patch (topical film) 1 patch Transdermal Daily.   . Warfarin Sodium 5 MG Oral Tab Take one tablet (5mg ) by mouth every Monday, and take 1&1/2 tablets (7.5mg ) on all other days or as directed by [name].     No current facility-administered medications for this visit.     ROS:  The remaining review of systems was reviewed and is negative.    Physical Examination:   Filed Vitals:    08/08/13 1507   BP: 100/76   Pulse: 64   Weight: 175 lb 6.4 oz (79.561 kg)     GEN: well-appearing young man sitting comfortably in chair, in NAD  EYES: EOMI, sclerae anicteric without injection  HENT: NC/AT  RESP: non-labored work of breathing  CV: RRR  GI: soft, non-distended  MSK: LLE in walking boot  SKIN: no rash or bruise on exposed surfaces  NEURO: alert, grossly oriented with intact cranial nerves  PSYCH: full affect, euthymic mood      Assessment/Plan:  I discussed the pt with Maryelizabeth Rowan, attending physician, for a problem-focused visit.    27y/o gentleman presents for f/u of L ankle sprain.    # L ankle  sprain - Symptomatically improving and primarily a DME issue that brings pt into clinic today. Needs closed-toed footwear. He was not aware that the Aircast would be so helpful. Have advised he trial a combination of ACE wrap and Aircast in his own closed-toed work boot since it will be difficult to obtain a closed-toed orthopedic walking boot on short notice.   -- Advised use of ACE wrap + Aircast in personal work boot   -- Provided with return-to-work letter    # Healthcare maintenance - Amenable to receiving vaccinations today.   -- Tdap   -- Annual influenza vaccine    # Dispo - RTC PRN      Patient Instructions:   -- Please use the air cast with ace wrap in conjunction with your regular closed-toe work boot  -- We have administered vaccinations against tetanus and  influenza

## 2013-08-08 NOTE — Progress Notes (Signed)
-------------------------------------------    Attending: Cindee Lame, MD  I discussed this patient's history and physical findings with the resident, as well as the plan for this patient.  I have reviewed and confirm the findings and plan as documented in the resident's note .  Key findings based upon this discussion:  Ankle sprain.  -------------------------------------------

## 2013-08-08 NOTE — Progress Notes (Signed)
Per Health Care Maintenance the patient is due for tdap(pended). Please review and sign order(s) as necessary.

## 2013-08-22 ENCOUNTER — Telehealth (HOSPITAL_BASED_OUTPATIENT_CLINIC_OR_DEPARTMENT_OTHER): Payer: Self-pay

## 2013-08-26 ENCOUNTER — Telehealth (HOSPITAL_BASED_OUTPATIENT_CLINIC_OR_DEPARTMENT_OTHER): Payer: Self-pay

## 2013-08-26 ENCOUNTER — Other Ambulatory Visit (HOSPITAL_BASED_OUTPATIENT_CLINIC_OR_DEPARTMENT_OTHER): Payer: Self-pay

## 2013-08-26 ENCOUNTER — Ambulatory Visit
Admit: 2013-08-26 | Discharge: 2013-08-26 | Disposition: A | Discharge status: Home / Self Care | Payer: No Typology Code available for payment source | Attending: Pharmacist | Admitting: Pharmacist

## 2013-08-26 DIAGNOSIS — Z8673 Personal history of transient ischemic attack (TIA), and cerebral infarction without residual deficits: Secondary | ICD-10-CM | POA: Insufficient documentation

## 2013-08-26 DIAGNOSIS — Z954 Presence of other heart-valve replacement: Secondary | ICD-10-CM | POA: Insufficient documentation

## 2013-08-26 DIAGNOSIS — Z7901 Long term (current) use of anticoagulants: Secondary | ICD-10-CM | POA: Insufficient documentation

## 2013-08-26 DIAGNOSIS — Z952 Presence of prosthetic heart valve: Secondary | ICD-10-CM

## 2013-08-26 LAB — PROTHROMBIN TIME
Prothrombin INR: 2.9 — ABNORMAL HIGH (ref 0.8–1.3)
Prothrombin Time Patient: 29.7 s — ABNORMAL HIGH (ref 10.7–15.6)

## 2013-08-29 ENCOUNTER — Ambulatory Visit: Payer: No Typology Code available for payment source

## 2013-08-29 DIAGNOSIS — Z8673 Personal history of transient ischemic attack (TIA), and cerebral infarction without residual deficits: Secondary | ICD-10-CM

## 2013-08-29 DIAGNOSIS — Z7901 Long term (current) use of anticoagulants: Secondary | ICD-10-CM

## 2013-08-29 DIAGNOSIS — Z952 Presence of prosthetic heart valve: Secondary | ICD-10-CM

## 2013-08-29 NOTE — Telephone Encounter (Signed)
ANTICOAGULATION TREATMENT PLAN    Indication: MVR; recurrent CVA  Goal INR: 2.5-3.5  Duration of Therapy: chronic    Hemorrhagic Risk Score: 1  Warfarin Tablet Size: 5mg     Relevant Historic Information: CVA 2008; 2005; 2006    Referring Provider: Nadene Rubins    SUBJECTIVE:   No unusual bruising/bleeding. He is getting more mobile as his ankle/left foot  Heals after injuring it last month.  He has since followed up at [facility] with his provider. Diet consistent. Pt took all prescribed doses. No acute illness or signs/sx of stroke noted by patient.  Activity level stable.  Green vegetable intake stable (one serving week usually).    Present dose: Warfarin 5mg  Mon and 7.5mg  all other days of the week    OBJECTIVE:     Relevant medication changes: None.     LABS:   INR      2.9   08/26/2013  INR      3.5   07/22/2013  INR      3.3   06/30/2013  INR      4.5   01/22/2012      ASSESSMENT:   INR therapeutic in otherwise stable patient without any warfarin related concerns or events.    As no reported changes in diet, lifestyle, or medications since last ACC assessment, it is reasonable to continue current maintenance dose and continue frequency of INR monitoring.      PLAN:   1. Continue warfarin 5mg  Mon and 7.5mg  all other days of the week.   2. Return in 6 weeks (on 10/07/2013).  INR @ [place].         Terence Lux, PharmD, CACP

## 2013-10-07 ENCOUNTER — Ambulatory Visit: Payer: No Typology Code available for payment source | Attending: Pharmacist

## 2013-10-07 ENCOUNTER — Other Ambulatory Visit (HOSPITAL_BASED_OUTPATIENT_CLINIC_OR_DEPARTMENT_OTHER): Payer: Self-pay

## 2013-10-07 DIAGNOSIS — Z8673 Personal history of transient ischemic attack (TIA), and cerebral infarction without residual deficits: Secondary | ICD-10-CM

## 2013-10-07 DIAGNOSIS — Z7901 Long term (current) use of anticoagulants: Secondary | ICD-10-CM | POA: Insufficient documentation

## 2013-10-07 DIAGNOSIS — Z952 Presence of prosthetic heart valve: Secondary | ICD-10-CM

## 2013-10-07 DIAGNOSIS — Z954 Presence of other heart-valve replacement: Secondary | ICD-10-CM | POA: Insufficient documentation

## 2013-10-07 LAB — PROTHROMBIN TIME
Prothrombin INR: 4.1 — ABNORMAL HIGH (ref 0.8–1.3)
Prothrombin Time Patient: 38.8 s — ABNORMAL HIGH (ref 10.7–15.6)

## 2013-10-09 NOTE — Telephone Encounter (Signed)
ANTICOAGULATION TREATMENT PLAN    Indication: MVR; recurrent CVA  Goal INR: 2.5-3.5  Duration of Therapy: chronic    Hemorrhagic Risk Score: 1  Warfarin Tablet Size: 5mg     Relevant Historic Information: CVA 2008; 2005; 2006    Referring Provider: Nadene Rubins    SUBJECTIVE:   Patient denies any signs/symptoms of bleeding or unusual bruising despite overanticoagulation. Denies signs/symptoms of TIA/CVA. No acute illnesses.  No changes in diet or activity level since last ACC visit. No edema. No new medications. Patient reports he had 1 & 1/2 glasses of wine on New Years Eve.     OBJECTIVE:   Present dose: Warfarin 5mg  M, 7.5mg  on all other days of the week. Pt denies omissions or errors in warfarin dosing.    Relevant medication changes: none      LABS:   INR      4.1   10/07/2013  INR      2.9   08/26/2013  INR      3.5   07/22/2013  INR      4.5   01/22/2012       ASSESSMENT:   Supratherapeutic INR possibly due to alcohol on 1/1 although this amount should not affect INR. No evidence of blee. ding    PLAN:   1. As patient already took his dose today, he will take 5mg  tomorrow, then resume warfarin at usual dose of 5mg  M, 7.5mg  on all other days of the week.   2. Return in 2 weeks (on 10/21/2013).   3. Pt acknowledged understanding of this plan    Encarnacion Chu PharmD, CACP

## 2013-10-21 ENCOUNTER — Other Ambulatory Visit (HOSPITAL_BASED_OUTPATIENT_CLINIC_OR_DEPARTMENT_OTHER): Payer: Self-pay

## 2013-10-21 ENCOUNTER — Ambulatory Visit
Admit: 2013-10-21 | Discharge: 2013-10-21 | Disposition: A | Discharge status: Home / Self Care | Payer: No Typology Code available for payment source | Attending: Pharmacist | Admitting: Pharmacist

## 2013-10-21 ENCOUNTER — Telehealth (HOSPITAL_BASED_OUTPATIENT_CLINIC_OR_DEPARTMENT_OTHER): Payer: Self-pay

## 2013-10-21 DIAGNOSIS — Z954 Presence of other heart-valve replacement: Secondary | ICD-10-CM | POA: Insufficient documentation

## 2013-10-21 DIAGNOSIS — Z7901 Long term (current) use of anticoagulants: Secondary | ICD-10-CM | POA: Insufficient documentation

## 2013-10-21 DIAGNOSIS — Z952 Presence of prosthetic heart valve: Secondary | ICD-10-CM

## 2013-10-21 DIAGNOSIS — Z8673 Personal history of transient ischemic attack (TIA), and cerebral infarction without residual deficits: Secondary | ICD-10-CM

## 2013-10-21 LAB — PROTHROMBIN TIME
Prothrombin INR: 2.2 — ABNORMAL HIGH (ref 0.8–1.3)
Prothrombin Time Patient: 23.9 s — ABNORMAL HIGH (ref 10.7–15.6)

## 2013-10-24 ENCOUNTER — Ambulatory Visit: Payer: No Typology Code available for payment source

## 2013-10-24 DIAGNOSIS — Z7901 Long term (current) use of anticoagulants: Secondary | ICD-10-CM

## 2013-10-24 DIAGNOSIS — Z952 Presence of prosthetic heart valve: Secondary | ICD-10-CM

## 2013-10-24 DIAGNOSIS — Z8673 Personal history of transient ischemic attack (TIA), and cerebral infarction without residual deficits: Secondary | ICD-10-CM

## 2013-10-24 NOTE — Telephone Encounter (Signed)
ANTICOAGULATION TREATMENT PLAN    Indication: MVR; recurrent CVA  Goal INR: 2.5-3.5  Duration of Therapy: chronic    Hemorrhagic Risk Score: 1  Warfarin Tablet Size: 5mg     Relevant Historic Information: CVA 2008; 2005; 2006    Referring Provider: Nadene Rubins    SUBJECTIVE:   Spoke with patient by phone regarding INR results.  No unusual bruising/bleeding. Diet consistent. Pt took all prescribed doses. No acute illness or signs/sx of stroke noted by patient.  Activity level stable.  Green vegetable intake low, normally.  He had 1/2 bag broccoli as directed 1/4 to assist returning INR back to range. He avoids green tea. Pt has not consumed any new supplements or beverages.      Present dose: Warfarin 5mg  1/5, then warfarin 5mg  Mondays and 7.5mg  all other days of the week (50mg /wk)    OBJECTIVE:     Relevant medication changes: None noted by pt.       LABS:   INR      2.2   10/21/2013  INR      4.1   10/07/2013  INR      2.9   08/26/2013        ASSESSMENT:   INR below goal without clear cause. Would have expected INR to return to range after one large serving of vitamin K foods 2 weeks ago.  Pt has continued his normal routine otherwise.     PLAN:   1. 7.5mg  today of warfarin, then resume 5mg  Mon and 7.5mg  all other days of the week.    2. Return in 11 days (on 11/04/2013).  INR @ [place].         Terence Lux, PharmD, CACP

## 2013-11-04 ENCOUNTER — Ambulatory Visit
Admit: 2013-11-04 | Discharge: 2013-11-04 | Disposition: A | Discharge status: Home / Self Care | Payer: No Typology Code available for payment source | Attending: Pharmacist | Admitting: Pharmacist

## 2013-11-04 ENCOUNTER — Telehealth (HOSPITAL_BASED_OUTPATIENT_CLINIC_OR_DEPARTMENT_OTHER): Payer: Self-pay

## 2013-11-04 ENCOUNTER — Other Ambulatory Visit (HOSPITAL_BASED_OUTPATIENT_CLINIC_OR_DEPARTMENT_OTHER): Payer: Self-pay

## 2013-11-04 DIAGNOSIS — Z7901 Long term (current) use of anticoagulants: Secondary | ICD-10-CM

## 2013-11-04 DIAGNOSIS — Z954 Presence of other heart-valve replacement: Secondary | ICD-10-CM | POA: Insufficient documentation

## 2013-11-04 DIAGNOSIS — Z8673 Personal history of transient ischemic attack (TIA), and cerebral infarction without residual deficits: Secondary | ICD-10-CM

## 2013-11-04 DIAGNOSIS — Z952 Presence of prosthetic heart valve: Secondary | ICD-10-CM

## 2013-11-04 LAB — PROTHROMBIN TIME
Prothrombin INR: 3 — ABNORMAL HIGH (ref 0.8–1.3)
Prothrombin Time Patient: 30.8 s — ABNORMAL HIGH (ref 10.7–15.6)

## 2013-11-07 ENCOUNTER — Ambulatory Visit: Payer: No Typology Code available for payment source

## 2013-11-07 ENCOUNTER — Telehealth (HOSPITAL_BASED_OUTPATIENT_CLINIC_OR_DEPARTMENT_OTHER): Payer: Self-pay

## 2013-11-07 DIAGNOSIS — Z7901 Long term (current) use of anticoagulants: Secondary | ICD-10-CM

## 2013-11-07 DIAGNOSIS — Z952 Presence of prosthetic heart valve: Secondary | ICD-10-CM

## 2013-11-07 DIAGNOSIS — Z8673 Personal history of transient ischemic attack (TIA), and cerebral infarction without residual deficits: Secondary | ICD-10-CM

## 2013-11-07 NOTE — Telephone Encounter (Signed)
ANTICOAGULATION TREATMENT PLAN    Indication: MVR; recurrent CVA  Goal INR: 2.5-3.5  Duration of Therapy: chronic    Hemorrhagic Risk Score: 1  Warfarin Tablet Size: 5mg     Relevant Historic Information: CVA 2008; 2005; 2006    Referring Provider: Nadene Rubins    SUBJECTIVE:  Patient denies bruising or bleeding.  He denies signs or symptoms of stroke or TIA.  He reports having sharp chest pain for 2 minutes "in my heart" on 11/04/13, which recurred intermittently over the course of several hours.  He reports having "a little pain in my back" as well.  He denies shortness of breath.  He denies chest pressure.  He reports these symptoms also occurred 3-4 months ago, but has yet to seek further evaluation.    He reports consistent level of activity.  He reports consistent dietary intake of vitamin K with an estimated 1 servings per week.  He reports a change in acute alcohol intake, having 1-2 bottles of beer 2 weeks.    ANTICOAGULANT DOSE:   Warfarin 5mg  Mon and 7.5mg  all other days (50mg /week) which was continued after a dose of 7.5mg  (instead of 5mg ) on 10/24/13 due to slightly subtherapeutic INR of unclear etiology.    Otherwise, he has taken 50mg /wk regimen since 06/01/13 with acute dose adjustments as needed.    He denies missed or extra doses and correctly recites above regimen.    OBJECTIVE:  He denies a change in prescription medications or over-the-counter therapies.    LABS:   INR      3.0   11/04/2013  INR      2.2   10/21/2013  INR      4.1   10/07/2013  INR      2.9   08/26/2013  INR      3.5   07/22/2013  INR      4.5   01/22/2012    ASSESSMENT:   Therapeutic INR without warfarin-related complications in a seeminly stable patient.    As no reported changes in diet, lifestyle, or medications since last [facility] assessment, it is reasonable to continue current maintenance dose and resume prior frequency of INR monitoring.    Given reports of chest pain, this warrants further evaluation by cardiology or other healthcare  providers as these symptoms are beyond the scope of anticoagulation management.  Risk of thrombotic event would presumably be minimal due to therapeutic INR.    PLAN:   1) CONTINUE warfarin at 5mg  Mon and 7.5mg  all other days    2) Return in 5 weeks (on 12/09/2013).    3) Patient verbally expressed understanding of the above plan; defer to cardiology for further evaluation of reported chest pain    Duncan Dull, PharmD

## 2013-11-07 NOTE — Telephone Encounter (Signed)
Spoke with Walter Rice.  He had a sharp chest pain last Friday.  He states he was told by his boss at work he may have a clot in his heart.  He worked his shift, ran errands, and had a good weekend.  The pain was gone in minutes.  The sharp pain would change as he moved his arms around, to make it feel better.  No palpitations, no SOB.  No presyncope.  No changes is sleep pattern or unusual weight gain.  No other complaints at this time.    I explained that pain that can be relieved by moving his arms around does not sound cardiac in nature, and I would be glad to have him call me when he is having any other symptoms so we can talk about it and make a good, safe plan for him.    He wrote down my phone number.  Karolee Stamps, RN    Of note, he is working days now.  He is also moved in with his fiance in [place].

## 2013-11-08 ENCOUNTER — Other Ambulatory Visit (EMERGENCY_DEPARTMENT_HOSPITAL): Payer: Self-pay | Admitting: Internal Medicine

## 2013-11-08 ENCOUNTER — Emergency Department
Admission: EM | Admit: 2013-11-08 | Discharge: 2013-11-08 | Disposition: A | Discharge status: Home / Self Care | Payer: No Typology Code available for payment source | Attending: Emergency Medicine | Admitting: Emergency Medicine

## 2013-11-08 DIAGNOSIS — R42 Dizziness and giddiness: Secondary | ICD-10-CM | POA: Insufficient documentation

## 2013-11-08 DIAGNOSIS — R0609 Other forms of dyspnea: Secondary | ICD-10-CM | POA: Insufficient documentation

## 2013-11-08 DIAGNOSIS — R0989 Other specified symptoms and signs involving the circulatory and respiratory systems: Secondary | ICD-10-CM

## 2013-11-08 DIAGNOSIS — R0602 Shortness of breath: Secondary | ICD-10-CM

## 2013-11-08 LAB — BASIC METABOLIC PANEL
Anion Gap: 4 (ref 3–11)
Calcium: 9.2 mg/dL (ref 8.9–10.2)
Carbon Dioxide, Total: 29 mEq/L (ref 22–32)
Chloride: 105 mEq/L (ref 98–108)
Creatinine: 0.99 mg/dL (ref 0.51–1.18)
GFR, Calc, African American: >60 mL/min (ref >59)
GFR, Calc, European American: >60 mL/min (ref >59)
Glucose: 93 mg/dL (ref 62–125)
Potassium: 4.2 mEq/L (ref 3.7–5.2)
Sodium: 138 mEq/L (ref 136–145)
Urea Nitrogen: 11 mg/dL (ref 8–21)

## 2013-11-08 LAB — 1ST EXTRA GRAY TOP

## 2013-11-08 LAB — CBC (HEMOGRAM)
Hematocrit: 40 % (ref 38–50)
Hemoglobin: 13.6 g/dL (ref 13.0–18.0)
MCH: 31.5 pg (ref 27.3–33.6)
MCHC: 33.7 g/dL (ref 32.2–36.5)
MCV: 94 fL (ref 81–98)
Platelet Count: 201 10*3/uL (ref 150–400)
RBC: 4.32 mil/uL — ABNORMAL LOW (ref 4.40–5.60)
RDW-CV: 12.9 % (ref 11.6–14.4)
WBC: 7.25 10*3/uL (ref 4.3–10.0)

## 2013-11-08 LAB — CALCIUM, (REFLEXIVE IONIZED)

## 2013-11-08 LAB — TROPONIN_I
Troponin_I Interpretation: NORMAL
Troponin_I: <0.03 ng/mL (ref <0.04)

## 2013-11-08 LAB — 1ST EXTRA RED TOP

## 2013-11-08 LAB — MAGNESIUM: Magnesium: 1.9 mg/dL (ref 1.8–2.4)

## 2013-11-08 LAB — PROTHROMBIN & PTT
Partial Thromboplastin Time: 45 s — ABNORMAL HIGH (ref 22–35)
Prothrombin INR: 2.9 — ABNORMAL HIGH (ref 0.8–1.3)
Prothrombin Time Patient: 30.1 s — ABNORMAL HIGH (ref 10.7–15.6)

## 2013-11-08 LAB — PHOSPHATE: Phosphate: 3.6 mg/dL (ref 2.5–4.5)

## 2013-11-08 LAB — B_TYPE NATRIURETIC PEPTIDE: B_Type Natriuretic Peptide: 21 pg/mL (ref <101)

## 2013-12-09 ENCOUNTER — Ambulatory Visit
Admit: 2013-12-09 | Discharge: 2013-12-09 | Disposition: A | Discharge status: Home / Self Care | Payer: No Typology Code available for payment source | Attending: Pharmacist | Admitting: Pharmacist

## 2013-12-09 ENCOUNTER — Other Ambulatory Visit (HOSPITAL_BASED_OUTPATIENT_CLINIC_OR_DEPARTMENT_OTHER): Payer: Self-pay

## 2013-12-09 ENCOUNTER — Telehealth (HOSPITAL_BASED_OUTPATIENT_CLINIC_OR_DEPARTMENT_OTHER): Payer: Self-pay

## 2013-12-09 DIAGNOSIS — Z7901 Long term (current) use of anticoagulants: Secondary | ICD-10-CM

## 2013-12-09 DIAGNOSIS — Z954 Presence of other heart-valve replacement: Secondary | ICD-10-CM | POA: Insufficient documentation

## 2013-12-09 DIAGNOSIS — Z8673 Personal history of transient ischemic attack (TIA), and cerebral infarction without residual deficits: Secondary | ICD-10-CM

## 2013-12-09 DIAGNOSIS — Z952 Presence of prosthetic heart valve: Secondary | ICD-10-CM

## 2013-12-09 LAB — PROTHROMBIN TIME
Prothrombin INR: 3.2 — ABNORMAL HIGH (ref 0.8–1.3)
Prothrombin Time Patient: 32.2 s — ABNORMAL HIGH (ref 10.7–15.6)

## 2013-12-10 ENCOUNTER — Ambulatory Visit
Admit: 2013-12-10 | Discharge: 2013-12-10 | Disposition: A | Discharge status: Home / Self Care | Payer: No Typology Code available for payment source

## 2013-12-12 ENCOUNTER — Ambulatory Visit: Payer: No Typology Code available for payment source | Admitting: Pharmacist

## 2013-12-12 DIAGNOSIS — Z952 Presence of prosthetic heart valve: Secondary | ICD-10-CM

## 2013-12-12 DIAGNOSIS — Z7901 Long term (current) use of anticoagulants: Secondary | ICD-10-CM

## 2013-12-12 DIAGNOSIS — Z8673 Personal history of transient ischemic attack (TIA), and cerebral infarction without residual deficits: Secondary | ICD-10-CM

## 2013-12-12 NOTE — Telephone Encounter (Signed)
ANTICOAGULATION TREATMENT PLAN    Indication: MVR; recurrent CVA  Goal INR: 2.5-3.5  Duration of Therapy: chronic    Hemorrhagic Risk Score: 1  Warfarin Tablet Size: 5mg     Relevant Historic Information: CVA 2008; 2005; 2006    Referring Provider: Nadene Rubins    SUBJECTIVE:   Pt was evaluated in the ER 11/08/13 for dizziness and light headedness.  Pt was advised at that time to take in more fluids which he has been doing.     Over the last month pt has had low back pain develop.  It feels better when he lays down. One morning he was unable to get out of bed because of the discomfort.      Diet stable. No bleeding concerns. No signs/sx of stroke.  He has noted on occasion hand tingling that has gone away after "shaking it out."    Present dose: Warfarin 5mg  Mon and 7.5mg  all other days.    OBJECTIVE:     Relevant medication changes: None noted by pt.    LABS:   INR      3.2   12/09/2013  INR      2.9   11/08/2013  INR      3.0   11/04/2013  INR      4.5   01/22/2012      ASSESSMENT:   INR therapeutic in stable patient. Advised patient be seen by Dr. Jimmie Molly for low back pain.  Also suggested pt may benefit from ergonomic evaluation at his workplace as he does a lot of heavy lifting. He may be aggravating the low back pain by poor lift technique.      PLAN:   1. Continue warfarin 5mg  Mon and 7.5mg  all other days of the week.   2. Return in 5 weeks (on 01/13/2014).  INR @ [facility].         Terence Lux, PharmD, CACP

## 2014-01-05 ENCOUNTER — Encounter (HOSPITAL_BASED_OUTPATIENT_CLINIC_OR_DEPARTMENT_OTHER): Payer: No Typology Code available for payment source | Admitting: Pediatrics

## 2014-01-05 NOTE — Progress Notes (Signed)
This patient failed a scheduled appointment today.  Disposition: Chart reviewed, patient contacted by phone regarding follow-up

## 2014-01-13 ENCOUNTER — Ambulatory Visit
Admit: 2014-01-13 | Discharge: 2014-01-13 | Disposition: A | Discharge status: Home / Self Care | Payer: No Typology Code available for payment source | Attending: Pharmacist | Admitting: Pharmacist

## 2014-01-13 ENCOUNTER — Other Ambulatory Visit (HOSPITAL_BASED_OUTPATIENT_CLINIC_OR_DEPARTMENT_OTHER): Payer: No Typology Code available for payment source

## 2014-01-13 ENCOUNTER — Telehealth (HOSPITAL_BASED_OUTPATIENT_CLINIC_OR_DEPARTMENT_OTHER): Payer: Self-pay

## 2014-01-13 DIAGNOSIS — Z954 Presence of other heart-valve replacement: Secondary | ICD-10-CM | POA: Insufficient documentation

## 2014-01-13 DIAGNOSIS — Z952 Presence of prosthetic heart valve: Secondary | ICD-10-CM

## 2014-01-13 DIAGNOSIS — Z7901 Long term (current) use of anticoagulants: Secondary | ICD-10-CM

## 2014-01-13 DIAGNOSIS — Z8673 Personal history of transient ischemic attack (TIA), and cerebral infarction without residual deficits: Secondary | ICD-10-CM

## 2014-01-13 LAB — PROTHROMBIN TIME
Prothrombin INR: 3.2 — ABNORMAL HIGH (ref 0.8–1.3)
Prothrombin Time Patient: 30.8 s — ABNORMAL HIGH (ref 10.7–15.6)

## 2014-01-16 ENCOUNTER — Telehealth (HOSPITAL_BASED_OUTPATIENT_CLINIC_OR_DEPARTMENT_OTHER): Payer: Self-pay

## 2014-01-17 ENCOUNTER — Ambulatory Visit: Payer: No Typology Code available for payment source | Admitting: Pharmacist

## 2014-01-17 DIAGNOSIS — Z952 Presence of prosthetic heart valve: Secondary | ICD-10-CM

## 2014-01-17 DIAGNOSIS — Z7901 Long term (current) use of anticoagulants: Secondary | ICD-10-CM

## 2014-01-17 DIAGNOSIS — Z8673 Personal history of transient ischemic attack (TIA), and cerebral infarction without residual deficits: Secondary | ICD-10-CM

## 2014-01-17 NOTE — Telephone Encounter (Signed)
ANTICOAGULATION TREATMENT PLAN    Indication: MVR; recurrent CVA  Goal INR: 2.5-3.5  Duration of Therapy: chronic    Hemorrhagic Risk Score: 1  Warfarin Tablet Size: 5mg     Relevant Historic Information: CVA 2008; 2005; 2006    Referring Provider: Nadene Rubins    SUBJECTIVE:   Patient reports he has a large bruise on his right arm attributable to minor trauma. He reports the bruise is starting to fade. No other concerns with bruising or bleeding. No signs/sx of TIA/CVA. No acute illnesses. No changes in diet or activity level since last ACC visit. No medication changes.   He has not had any additional episodes of dizziness or lightheadedness reported at last visit.     OBJECTIVE:   Present dose: Warfarin 5mg  M, 7.5mg  on all other days of the week. Pt denies omissions or errors in warfarin dosing.    Relevant medication changes: none      LABS:   INR      3.2   01/13/2014  INR      3.2   12/09/2013  INR      2.9   11/08/2013  INR      4.5   01/22/2012       ASSESSMENT:   Therapeutic INR in stable patient without warfarin related complications.      PLAN:   1. Continue warfarin 5mg  M, 7.5mg  on all other days of the week    2. Return in 4 weeks (on 02/17/2014)    3. Pt acknowledged understanding of this plan    Willaim Bane

## 2014-02-17 ENCOUNTER — Other Ambulatory Visit (HOSPITAL_BASED_OUTPATIENT_CLINIC_OR_DEPARTMENT_OTHER): Payer: Self-pay

## 2014-02-17 ENCOUNTER — Ambulatory Visit: Payer: No Typology Code available for payment source | Attending: Pharmacist | Admitting: Pharmacist

## 2014-02-17 DIAGNOSIS — Z7901 Long term (current) use of anticoagulants: Secondary | ICD-10-CM | POA: Insufficient documentation

## 2014-02-17 DIAGNOSIS — Z952 Presence of prosthetic heart valve: Secondary | ICD-10-CM

## 2014-02-17 DIAGNOSIS — Z8673 Personal history of transient ischemic attack (TIA), and cerebral infarction without residual deficits: Secondary | ICD-10-CM

## 2014-02-17 DIAGNOSIS — Z954 Presence of other heart-valve replacement: Secondary | ICD-10-CM | POA: Insufficient documentation

## 2014-02-17 LAB — PROTHROMBIN TIME
Prothrombin INR: 4.5 — ABNORMAL HIGH (ref 0.8–1.3)
Prothrombin Time Patient: 40.2 s — ABNORMAL HIGH (ref 10.7–15.6)

## 2014-02-17 NOTE — Telephone Encounter (Signed)
ANTICOAGULATION TREATMENT PLAN    Indication: MVR; recurrent CVA  Goal INR: 2.5-3.5  Duration of Therapy: chronic    Hemorrhagic Risk Score: 1  Warfarin Tablet Size: 5mg     Relevant Historic Information: CVA 2008; 2005; 2006    Referring Provider: Nadene Rubins      INTERVAL HISTORY  ACC last spoke to pt on 01/17/14 when his INR was 3.2 (from 01/13/14).  ACC asked him to continue his current dose of warfarin (55mg /week) and repeat his INR on 02/17/14      SUBJECTIVE:   Mr. Recher denies any unusual bleeding or bruising.  Pt denies any signs or symptoms of CVA.  Pt reports that he experiencing vomiting and diarrhea starting the evening of 02/12/14 and that he missed work on 5/11 and 5/12.  He states the vomiting and diarrhea resolved spontaneously and he did not take any medications to treat the symptoms.  He was not eating very much at all on 5/11 and 5/12.  Pt denies any ingestion of alcohol.    Pt has been eating his usual diet for the last few days.  He has some intermittent back pain.      OBJECTIVE:   Present anticoagulant dose: Warfarin 5mg  MON and 7.5mg  others or 55mg /week  No dosing errors.    Relevant medication changes: none      LABS:   INR      4.5   02/17/2014  INR      3.2   01/13/2014  INR      3.2   12/09/2013  INR      2.9   11/08/2013  INR      3.0   11/04/2013      ASSESSMENT:   INR above target range of 2.5 to 3.5, likely related to acute and transient illness (and resultant decrease in oral intake) earlier this week.  Since pt is feeling better, I expect his warfarin requirements have not changed.      PLAN:   1) HOLD warfarin today then RESUME warfarin 5mg  MON and 7.5mg  others    2) Return in 7 days (on 02/24/2014).  Pt can only get his INR tested after work.  [facility] oncall pharmacist will check his INR result the evening of 5/22.  If the INR is within range, pt knows that the [facility] will not call him until the next business day (02/28/14).  If the INR is out of range, [facility] oncall pharmacist will contact pt on  5/22.    3) Patient is in agreement and verbally expressed understanding of the plan.    [name]

## 2014-02-24 ENCOUNTER — Other Ambulatory Visit (HOSPITAL_BASED_OUTPATIENT_CLINIC_OR_DEPARTMENT_OTHER): Payer: No Typology Code available for payment source

## 2014-02-24 ENCOUNTER — Ambulatory Visit: Payer: No Typology Code available for payment source | Attending: Pharmacist | Admitting: Pharmacist

## 2014-02-24 DIAGNOSIS — Z7901 Long term (current) use of anticoagulants: Secondary | ICD-10-CM | POA: Insufficient documentation

## 2014-02-24 DIAGNOSIS — Z954 Presence of other heart-valve replacement: Secondary | ICD-10-CM | POA: Insufficient documentation

## 2014-02-24 DIAGNOSIS — Z8673 Personal history of transient ischemic attack (TIA), and cerebral infarction without residual deficits: Secondary | ICD-10-CM

## 2014-02-24 DIAGNOSIS — Z952 Presence of prosthetic heart valve: Secondary | ICD-10-CM

## 2014-02-24 LAB — PROTHROMBIN TIME
Prothrombin INR: 3 — ABNORMAL HIGH (ref 0.8–1.3)
Prothrombin Time Patient: 29.6 s — ABNORMAL HIGH (ref 10.7–15.6)

## 2014-02-26 NOTE — Telephone Encounter (Signed)
ANTICOAGULATION TREATMENT PLAN    Indication: MVR; recurrent CVA  Goal INR: 2.5-3.5  Duration of Therapy: chronic    Hemorrhagic Risk Score: 1  Warfarin Tablet Size: 5mg     Relevant Historic Information: CVA 2008; 2005; 2006    Referring Provider: Nadene Rubins      SUBJECTIVE:   Patient denies any signs/symptoms of bleeding, unusual bruising, or signs/symptoms of TIA/CVA. Patient reports vomiting and diarrhea reported at last visit have resolved. He is back to his usual diet and exercise habits. Denies changes in Rx/OTCs/herbal medications since previous ACC visit.       OBJECTIVE:   Present dose: Warfarin held on 02/17/2014 due to INR of 4.5. Patient then instructed to continue usual warfarin dose of warfarin 5mg  Mondays and 7.5mg  on all other days of the week. Pt denies omissions or errors in warfarin dosing.    Relevant medication changes: none      LABS:   INR      3.0   02/24/2014  INR      4.5   02/17/2014  INR      3.2   01/13/2014  INR      3.2   12/09/2013  INR      2.9   11/08/2013  INR      4.5   01/22/2012     ASSESSMENT:   Therapeutic INR following held dose of warfarin on 02/17/2014 when INR was 4.5. Recent elevated INR in the setting of decreased oral intake, diarrhea and vomiting which have all resolved. As patient has been otherwise stable, will continue with regular warfarin maintenance dose.     PLAN:   1. Continue warfarin 5mg  Mondays and 7.5mg  on all other days of the week    2. Return in 2 weeks (on 03/09/2014).   3. Pt acknowledged understanding of this plan    Willaim Bane

## 2014-03-09 ENCOUNTER — Encounter (HOSPITAL_BASED_OUTPATIENT_CLINIC_OR_DEPARTMENT_OTHER): Payer: Self-pay | Admitting: Pediatrics

## 2014-03-09 ENCOUNTER — Ambulatory Visit: Payer: No Typology Code available for payment source | Attending: Pediatrics | Admitting: Pediatrics

## 2014-03-09 ENCOUNTER — Ambulatory Visit (HOSPITAL_BASED_OUTPATIENT_CLINIC_OR_DEPARTMENT_OTHER): Payer: No Typology Code available for payment source | Admitting: Pharmacist

## 2014-03-09 ENCOUNTER — Other Ambulatory Visit (HOSPITAL_BASED_OUTPATIENT_CLINIC_OR_DEPARTMENT_OTHER): Payer: Self-pay

## 2014-03-09 VITALS — BP 108/63 | HR 64 | Ht 72.0 in | Wt 167.8 lb

## 2014-03-09 DIAGNOSIS — Z8673 Personal history of transient ischemic attack (TIA), and cerebral infarction without residual deficits: Secondary | ICD-10-CM

## 2014-03-09 DIAGNOSIS — Z7901 Long term (current) use of anticoagulants: Secondary | ICD-10-CM

## 2014-03-09 DIAGNOSIS — Z952 Presence of prosthetic heart valve: Secondary | ICD-10-CM

## 2014-03-09 DIAGNOSIS — I495 Sick sinus syndrome: Secondary | ICD-10-CM | POA: Insufficient documentation

## 2014-03-09 DIAGNOSIS — Z954 Presence of other heart-valve replacement: Secondary | ICD-10-CM | POA: Insufficient documentation

## 2014-03-09 LAB — PROTHROMBIN TIME
Prothrombin INR: 3.6 — ABNORMAL HIGH (ref 0.8–1.3)
Prothrombin Time Patient: 34 s — ABNORMAL HIGH (ref 10.7–15.6)

## 2014-03-09 NOTE — Progress Notes (Signed)
[facility] Electrophysiology clinic note    CC: Pacemaker follow up    HPI:   Walter Rice was last seen on 06/30/2013, presented today for follow up.  He has been doing well from cardiovascular standpoint, denies pacemaker issues, including palpitation, lightheadedness, syncope.  No CP/DOE .    Tolerates exercise well without limitation.    He saw Libs in [facility] clinic on 06/30/13- have 1 year follow up with Dr.Stout.    PROBLEM LIST (PMH&PSH)  Patient Active Problem List    Diagnosis Date Noted   . 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 A) Status post repair with ventricular septal defect closure in February 1989. 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 Jude prosthetic valve in 1993.     . Sinus node dysfunction [427.81]      A) Status post Medtronic dual-chamber permanent pacemaker.       . Transient complete heart block [426.0]      Postoperative -- recovered.     . 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     . Headache [784.0]         . Mitral valve replaced [V43.3] 04/13/2013   . History of CVA (cerebrovascular accident) [V12.54] 04/13/2013   . Chronic anticoagulation [V58.61] 02/07/2013     [place] [facility] ENROLLED     . Pacemaker [V45.01] 01/22/2012   . 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 CVA (cerebral vascular accident) [434.91] 09/10/2011   . Sprain of ankle- Right 06/2011 [845.00] 06/16/2011       MEDICATIONS  Current Outpatient Prescriptions   Medication Sig Dispense Refill   . Acetaminophen 500 MG Oral Tab Take 2 tablet by mouth every 8 hours if needed to relieve pain 60 Tab prn   . Cyclobenzaprine HCl 5 MG Oral Tab 5 mg by mouth three times daily as needed Muscle Spasm.     . Lidocaine 5 % External Patch (topical film) 1 patch Transdermal Daily.     .  Warfarin Sodium 5 MG Oral Tab Take one tablet (5mg ) by mouth every Monday, and take 1&1/2 tablets (7.5mg ) on all other days or as directed by [facility]. 130 tablet 2     No current facility-administered medications for this visit.       Review of patient's allergies indicates:  Allergies   Allergen Reactions   . Sulfa Antibiotics Rash       History     Social History   . Marital Status: Single     Spouse Name: N/A     Number of Children: N/A   . Years of Education: N/A     Occupational History   . Not on file.     Social History Main Topics   . Smoking status: Current Every Day Smoker -- 0.25 packs/day   . Smokeless tobacco: Never Used   . Alcohol Use: No   . Drug Use: No   . Sexual Activity: Not on file     Other Topics Concern   . Not on file     Social History Narrative     ROS:  Complete review of system was negative except those mentioned in the above HPI.     PHYSICAL EXAM:   Filed Vitals:  03/09/14 0830   BP: 108/63   Pulse: 64   Height: 6' (1.829 m)   Weight: 167 lb 12.8 oz (76.114 kg)   SpO2: 97%     General: Not in acute distress, looks well  HEENT:NCAT, PER, sclera non-icteric, moist oral mucous membranes  CV: Pacemaker in left upper chest  Skin: well  Healed pacemaker wound  Neuro: intact grossly without focal deficits    CIED imterrogation  Pacemaker parameters seems to be stable.    CIED 06/30/2013 03/09/2014   Type PPM PPM   Manufacturer Medtronic Medtronic   Generator Model Adapta L Adapta L   Date of Generator Implant 10/28/2006 10/28/2006   Generator Serial Number [id number]   Lead #1 Type Atrial Atrial   Model # [id number]   Lead #1 Serial # [id number] V [id number]   Lead #1 Implant Date 11/17/1997 11/17/1997   Lead #2 Type RV RV   Lead #2 Model # [id number]   Lead #2 Serial # [id number]   Lead #2 Implant Date 11/17/1997 11/17/1997   Battery Voltage 2.76V 2.74   P-Wave 0 0   R-Wave 11.2 0   Atrial impedence [id number] 309   RV impedence 1495 1429   Atrial Threshold 1.5V@0 .52 1.5V@0 .52   RV  Threshold 1V@0 .4 1V@0 .4   Atrial Events 1 MS 1 MS   Ventricular Events - 0   % atrial pacing 100 100   % ventricular pacing 0.3 0.3   Underlying Rhythm - CHB   Mode AAIR<=>DDDR 60-180 AAIR<=>DDDR 60-180       ASSESSMENT AND PLAN  Walter Rice is a 28 year old male with AVC s/p repair who is pacemaker dependent due to sinus node dysfunction.  He has been doing well from electrophysiology standpoint with current dual chamber transvenous pacemaker.    - Return to clinic in 6 month device check  - 1 year follow up visit with Dr.Seslar.    This patient was seen and discussed with Dr. Demetria PoreSteve Seslar     Jolyssa Oplinger, MD  Adult Congenital Heart Disease Fellow

## 2014-03-09 NOTE — Telephone Encounter (Signed)
ANTICOAGULATION TREATMENT PLAN    Indication: MVR; recurrent CVA  Goal INR: 2.5-3.5  Duration of Therapy: chronic    Hemorrhagic Risk Score: 1  Warfarin Tablet Size: 5mg     Relevant Historic Information: CVA 2008; 2005; 2006    Referring Provider: Nadene Rubins    SUBJECTIVE:   Pt was last evaluated in [facility] on 02/26/14 and was advised to continue on his usual dose of warfarin with therapeutic INR. Marland Kitchen    Pt was rescheduled for appt in 2 weeks.      Spoke to pt about his INR results from today.  He denies bleeding or bruising.  Denies any signs/sxs of stroke/TIA.  Green intake is lower than usual.  He usually consumes 3-4 servings of light green salad including lettuce per week.  He plans to resume his usual diet this week.   No significant changes in activity or health since last [facility] visit.     OBJECTIVE:   Present dose: Warfarin dose 5mg  on Mon and 7.5mg  on all other days.   Relevant medication changes: none       LABS:   INR      3.6   03/09/2014  INR      3.0   02/24/2014  INR      4.5   02/17/2014  INR      4.5   01/22/2012       ASSESSMENT:   Slightly supratherapeutic INR from transient decrease in vitamin K intake. Will adjust dose acutely to achieve therapeutic INR.     PLAN:   1. Warfarin 5mg  instead of 7.5mg  tomorrow (since pt already took warfarin today), on 03/10/2014, and and then resume usual dose of 7.5mg  daily.    2. Return in 2 weeks (on 03/24/2014).  3. Pt verbalized understanding and agreed to above plan.  4. Emailed instructions to pt's aunt and sister.    Royston Bake

## 2014-03-09 NOTE — Progress Notes (Signed)
ATTENDING STATEMENT:   I saw and evaluated the patient and agree with Dr. Lucrezia Starch note.   Date of Service:  03/09/2014  Today's Date is: 03/09/2014    I spent a total time of over 15 minutes face-to-face with the patient, of which more than 50% of that time was spent in care coordination and counseling as outlined in this note.

## 2014-03-24 ENCOUNTER — Ambulatory Visit
Admit: 2014-03-24 | Discharge: 2014-03-24 | Disposition: A | Discharge status: Home / Self Care | Payer: No Typology Code available for payment source | Attending: Pharmacist | Admitting: Pharmacist

## 2014-03-24 ENCOUNTER — Telehealth (HOSPITAL_BASED_OUTPATIENT_CLINIC_OR_DEPARTMENT_OTHER): Payer: Self-pay

## 2014-03-24 ENCOUNTER — Other Ambulatory Visit (HOSPITAL_BASED_OUTPATIENT_CLINIC_OR_DEPARTMENT_OTHER): Payer: No Typology Code available for payment source

## 2014-03-24 DIAGNOSIS — Z7901 Long term (current) use of anticoagulants: Secondary | ICD-10-CM

## 2014-03-24 DIAGNOSIS — Z8673 Personal history of transient ischemic attack (TIA), and cerebral infarction without residual deficits: Secondary | ICD-10-CM

## 2014-03-24 DIAGNOSIS — Z952 Presence of prosthetic heart valve: Secondary | ICD-10-CM

## 2014-03-24 DIAGNOSIS — Z954 Presence of other heart-valve replacement: Secondary | ICD-10-CM | POA: Insufficient documentation

## 2014-03-24 LAB — PROTHROMBIN TIME
Prothrombin INR: 3.3 — ABNORMAL HIGH (ref 0.8–1.3)
Prothrombin Time Patient: 31.8 s — ABNORMAL HIGH (ref 10.7–15.6)

## 2014-03-27 ENCOUNTER — Ambulatory Visit: Payer: No Typology Code available for payment source | Admitting: Pharmacist

## 2014-03-27 DIAGNOSIS — Z7901 Long term (current) use of anticoagulants: Secondary | ICD-10-CM

## 2014-03-27 DIAGNOSIS — Z952 Presence of prosthetic heart valve: Secondary | ICD-10-CM

## 2014-03-27 DIAGNOSIS — Z8673 Personal history of transient ischemic attack (TIA), and cerebral infarction without residual deficits: Secondary | ICD-10-CM

## 2014-03-27 NOTE — Telephone Encounter (Signed)
ANTICOAGULATION TREATMENT PLAN    Indication: MVR; recurrent CVA  Goal INR: 2.5-3.5  Duration of Therapy: chronic    Hemorrhagic Risk Score: 1  Warfarin Tablet Size: 5mg     Relevant Historic Information: CVA 2008; 2005; 2006    Referring Provider: Nadene Rubins    SUBJECTIVE:  Patient denies bruising or bleeding.  He denies signs or symptoms of stroke or TIA.  He denies acute illness or changes in overall health.    He reports consistent level of activity.  He reports consistent dietary intake of vitamin K with an estimated 3-4 servings per week.    ANTICOAGULANT DOSE:   Warfarin 5mg  Mon and 7.5mg  all other days (50mg /week) since 06/01/13 with acute dose adjustments as needed.    A significant portion of today's 10 minute telephonic encounter surrounded patient reporting he took an "extra dose" on either 03/24/14 or 03/25/14.  Patient originally stated taking an "extra dose" on 03/24/14, then later stated he merely delayed his 1100 dose to 0000 on 03/25/14.  He then stated he took an "extra dose" upon picking up his refill on 03/25/14.  When repeatedly questioned regarding this incident, his report appeared to change and eventually he denied taking an extra dose altogether.    He correctly recites above regimen.    OBJECTIVE:  No reported medication changes.    LABS:   INR      3.3   03/24/2014  INR      3.6   03/09/2014  INR      3.0   02/24/2014  INR      4.5   02/17/2014  INR      3.2   01/13/2014    ASSESSMENT:   Therapeutic INR without warfarin-related complications in a stable patient.    Considering patient's health and diet appear to have returned to baseline since last ACC assessment, it is reasonable to continue current maintenance dose and resume prior frequency of INR monitoring.    As it is unclear if patient took an extra dose, and is already scheduled for a lower dose today, it is reasonable to continue warfarin without empiric adjustment, particularly given patient's anticoagulation indication.    PLAN:   1)  CONTINUE warfarin at 5mg  Mon and 7.5mg  all other days    2) Return in 5 weeks (on 04/28/2014); ACC instructed 4 weeks however due to patient's daughter visiting he is unable to repeat on this date    3) Patient verbally expressed understanding of the above plan    Walter Rice A Walter Rice

## 2014-04-13 ENCOUNTER — Other Ambulatory Visit (EMERGENCY_DEPARTMENT_HOSPITAL): Payer: Self-pay | Admitting: Emergency Medicine

## 2014-04-13 ENCOUNTER — Observation Stay (HOSPITAL_BASED_OUTPATIENT_CLINIC_OR_DEPARTMENT_OTHER): Payer: No Typology Code available for payment source | Admitting: Cardiovascular Disease

## 2014-04-13 ENCOUNTER — Other Ambulatory Visit (HOSPITAL_COMMUNITY): Payer: Self-pay | Admitting: Cardiovascular Disease

## 2014-04-13 ENCOUNTER — Observation Stay
Admission: EM | Admit: 2014-04-13 | Discharge: 2014-04-14 | Disposition: A | Discharge status: Home / Self Care | Payer: No Typology Code available for payment source | Attending: Cardiovascular Disease | Admitting: Cardiovascular Disease

## 2014-04-13 DIAGNOSIS — M6281 Muscle weakness (generalized): Secondary | ICD-10-CM | POA: Insufficient documentation

## 2014-04-13 DIAGNOSIS — R079 Chest pain, unspecified: Secondary | ICD-10-CM

## 2014-04-13 DIAGNOSIS — Z8673 Personal history of transient ischemic attack (TIA), and cerebral infarction without residual deficits: Secondary | ICD-10-CM | POA: Insufficient documentation

## 2014-04-13 DIAGNOSIS — Q2111 Secundum atrial septal defect: Secondary | ICD-10-CM | POA: Insufficient documentation

## 2014-04-13 DIAGNOSIS — Z950 Presence of cardiac pacemaker: Secondary | ICD-10-CM | POA: Insufficient documentation

## 2014-04-13 DIAGNOSIS — F17200 Nicotine dependence, unspecified, uncomplicated: Secondary | ICD-10-CM | POA: Insufficient documentation

## 2014-04-13 DIAGNOSIS — D684 Acquired coagulation factor deficiency: Secondary | ICD-10-CM | POA: Insufficient documentation

## 2014-04-13 DIAGNOSIS — Z7901 Long term (current) use of anticoagulants: Secondary | ICD-10-CM | POA: Insufficient documentation

## 2014-04-13 DIAGNOSIS — Q211 Atrial septal defect: Secondary | ICD-10-CM | POA: Insufficient documentation

## 2014-04-13 DIAGNOSIS — R0602 Shortness of breath: Secondary | ICD-10-CM | POA: Insufficient documentation

## 2014-04-13 DIAGNOSIS — R5381 Other malaise: Secondary | ICD-10-CM | POA: Insufficient documentation

## 2014-04-13 DIAGNOSIS — R002 Palpitations: Secondary | ICD-10-CM

## 2014-04-13 DIAGNOSIS — Z954 Presence of other heart-valve replacement: Secondary | ICD-10-CM | POA: Insufficient documentation

## 2014-04-13 DIAGNOSIS — R5383 Other fatigue: Secondary | ICD-10-CM | POA: Insufficient documentation

## 2014-04-13 DIAGNOSIS — R0789 Other chest pain: Principal | ICD-10-CM | POA: Insufficient documentation

## 2014-04-13 LAB — BASIC METABOLIC PANEL
Anion Gap: 5 (ref 4–12)
Calcium: 9.2 mg/dL (ref 8.9–10.2)
Carbon Dioxide, Total: 29 mEq/L (ref 22–32)
Chloride: 101 mEq/L (ref 98–108)
Creatinine: 0.94 mg/dL (ref 0.51–1.18)
GFR, Calc, African American: >60 mL/min (ref >59)
GFR, Calc, European American: >60 mL/min (ref >59)
Glucose: 85 mg/dL (ref 62–125)
Potassium: 3.9 mEq/L (ref 3.6–5.2)
Sodium: 135 mEq/L (ref 135–145)
Urea Nitrogen: 11 mg/dL (ref 8–21)

## 2014-04-13 LAB — CBC (HEMOGRAM)
Hematocrit: 40 % (ref 38–50)
Hemoglobin: 13.7 g/dL (ref 13.0–18.0)
MCH: 31.8 pg (ref 27.3–33.6)
MCHC: 33.9 g/dL (ref 32.2–36.5)
MCV: 94 fL (ref 81–98)
Platelet Count: 185 10*3/uL (ref 150–400)
RBC: 4.31 mil/uL — ABNORMAL LOW (ref 4.40–5.60)
RDW-CV: 13.1 % (ref 11.6–14.4)
WBC: 6.74 10*3/uL (ref 4.3–10.0)

## 2014-04-13 LAB — PROTHROMBIN & PTT
Partial Thromboplastin Time: 43 s — ABNORMAL HIGH (ref 22–35)
Prothrombin INR: 3 — ABNORMAL HIGH (ref 0.8–1.3)
Prothrombin Time Patient: 29.7 s — ABNORMAL HIGH (ref 10.7–15.6)

## 2014-04-13 LAB — TROPONIN_I
Troponin_I Interpretation: NORMAL
Troponin_I: <0.03 ng/mL (ref <0.04)

## 2014-04-13 LAB — 1ST EXTRA GRAY TOP

## 2014-04-13 LAB — 1ST EXTRA RED TOP

## 2014-04-14 ENCOUNTER — Other Ambulatory Visit (HOSPITAL_BASED_OUTPATIENT_CLINIC_OR_DEPARTMENT_OTHER): Payer: Self-pay | Admitting: Internal Medicine

## 2014-04-14 ENCOUNTER — Other Ambulatory Visit (HOSPITAL_COMMUNITY): Payer: Self-pay

## 2014-04-14 ENCOUNTER — Ambulatory Visit (HOSPITAL_BASED_OUTPATIENT_CLINIC_OR_DEPARTMENT_OTHER): Payer: No Typology Code available for payment source

## 2014-04-14 DIAGNOSIS — Z7901 Long term (current) use of anticoagulants: Secondary | ICD-10-CM

## 2014-04-14 DIAGNOSIS — R002 Palpitations: Secondary | ICD-10-CM

## 2014-04-14 DIAGNOSIS — Q2579 Other congenital malformations of pulmonary artery: Secondary | ICD-10-CM

## 2014-04-14 DIAGNOSIS — R0789 Other chest pain: Secondary | ICD-10-CM

## 2014-04-14 DIAGNOSIS — Z954 Presence of other heart-valve replacement: Secondary | ICD-10-CM

## 2014-04-14 DIAGNOSIS — R079 Chest pain, unspecified: Secondary | ICD-10-CM

## 2014-04-15 LAB — R/O MRSA

## 2014-04-17 ENCOUNTER — Telehealth (HOSPITAL_BASED_OUTPATIENT_CLINIC_OR_DEPARTMENT_OTHER): Payer: Self-pay

## 2014-04-17 ENCOUNTER — Encounter (HOSPITAL_BASED_OUTPATIENT_CLINIC_OR_DEPARTMENT_OTHER): Payer: Self-pay

## 2014-04-17 NOTE — Telephone Encounter (Signed)
Letter faxed to [facility] [employer].  Fax [phone] phone [phone].    Spoke with [name] and he knows the letter has been faxed.    Floyce Stakes

## 2014-04-18 NOTE — Telephone Encounter (Signed)
7/14 - could not get fax to go through yesterday.  Faxed today to [phone] and [phone]  Floyce Stakes

## 2014-04-25 ENCOUNTER — Encounter (HOSPITAL_BASED_OUTPATIENT_CLINIC_OR_DEPARTMENT_OTHER): Payer: No Typology Code available for payment source | Admitting: Internal Medicine

## 2014-04-25 ENCOUNTER — Ambulatory Visit: Payer: No Typology Code available for payment source | Attending: Internal Medicine | Admitting: Internal Medicine

## 2014-04-25 VITALS — BP 102/74 | HR 64 | Wt 167.0 lb

## 2014-04-25 DIAGNOSIS — M25562 Pain in left knee: Secondary | ICD-10-CM

## 2014-04-25 DIAGNOSIS — M25569 Pain in unspecified knee: Secondary | ICD-10-CM | POA: Insufficient documentation

## 2014-04-25 NOTE — Progress Notes (Signed)
This note was dictated by Yoseph Haile Lynn.

## 2014-04-26 NOTE — Progress Notes (Signed)
Walter Rice, Walter Rice          [id number]          04/25/2014      GENERAL INTERNAL MEDICINE CLINIC NOTE       IDENTIFICATION     The patient is a 28 year old male here today for hospital followup.    ISSUES DISCUSSED TODAY     1. Chest pain.  The patient was hospitalized at the [facility] on 04/14/2014 for chest pain.  He described palpitations and then chest pressure.  He was evaluated with a stress echocardiogram, as well as telemetry.  He had no obvious arrhythmias on telemetry.  On the stress echocardiogram, he exercised for 12 minutes, reaching a maximum heart rate of 150 with a maximum blood pressure 170/90.  He had normal baseline left ventricular size and function with no segmental wall motion abnormalities with exercise.  All left ventricular segments were hyperdynamic with a decrease in left ventricular size, indicating a normal response to exercise.  He had no echo or EKG evidence of ischemia.  He has had no further episodes of palpitations or chest pain since discharge.  He was discharged with a 30-day Holter monitor, which he has not been using.  2. Knee pain.  The patient reports knee pain on his left knee.  He gets intermittent episodes of significant pain, which will last for a minute and then resolve.  They occasionally occur when sitting or standing.  He has not noticed them to be associated with exercise.  3. Questions about pregnancy.  The patient has a 28-year-old daughter.  He is currently in a stable relationship with his girlfriend.  He would like to try to have another child.  He reports that they have been trying for 3 years, but she has not gotten pregnant.  He has questions today about whether he can still have children.    PHYSICAL EXAMINATION     GENERAL:  The patient is a well-appearing male.  VITAL SIGNS:  Blood pressure 102/74, pulse of 64 and regular, weight 167, BMI 22.64.  EXTREMITIES:  Examination of the left knee reveals full range of motion.  He has  slight hypermobility of the left patella, which occasionally pops out of position.  This is made worse by flexion and extension of the knee with medial pressure on the patella.    ASSESSMENT AND PLAN     1. Cardiac disease.  The patient is currently not having symptoms of chest pain or palpitations.  He declines to further wear his Holter monitor.  He has followup scheduled in August with the congenital electrophysiologist.  His last INR was 3.  He has followup later this week with the Anticoagulation Clinic for repeat INR testing.  2. Knee pain.  The patient has laxity of his patella and occasional medial movement of his patella.  We will refer to PT for evaluation and exercises.  3. Pregnancy questions.  The patient has successfully had a child in the past.  I recommended that his girlfriend check with her physician for evaluation and recommendations about the timing of intercourse, etc.    OVERALL PLAN     Patient to follow up with me as needed.

## 2014-04-28 ENCOUNTER — Telehealth (HOSPITAL_BASED_OUTPATIENT_CLINIC_OR_DEPARTMENT_OTHER): Payer: Self-pay

## 2014-05-03 ENCOUNTER — Encounter (HOSPITAL_BASED_OUTPATIENT_CLINIC_OR_DEPARTMENT_OTHER): Payer: Self-pay

## 2014-05-12 ENCOUNTER — Other Ambulatory Visit (HOSPITAL_BASED_OUTPATIENT_CLINIC_OR_DEPARTMENT_OTHER): Payer: No Typology Code available for payment source

## 2014-05-12 ENCOUNTER — Telehealth (HOSPITAL_BASED_OUTPATIENT_CLINIC_OR_DEPARTMENT_OTHER): Payer: Self-pay

## 2014-05-12 ENCOUNTER — Ambulatory Visit
Admit: 2014-05-12 | Discharge: 2014-05-12 | Disposition: A | Discharge status: Home / Self Care | Payer: No Typology Code available for payment source | Attending: Pharmacist | Admitting: Pharmacist

## 2014-05-12 DIAGNOSIS — Z954 Presence of other heart-valve replacement: Secondary | ICD-10-CM | POA: Insufficient documentation

## 2014-05-12 DIAGNOSIS — Z952 Presence of prosthetic heart valve: Secondary | ICD-10-CM

## 2014-05-12 DIAGNOSIS — Z8673 Personal history of transient ischemic attack (TIA), and cerebral infarction without residual deficits: Secondary | ICD-10-CM

## 2014-05-12 DIAGNOSIS — Z7901 Long term (current) use of anticoagulants: Secondary | ICD-10-CM | POA: Insufficient documentation

## 2014-05-12 LAB — PROTHROMBIN TIME
Prothrombin INR: 3.3 — ABNORMAL HIGH (ref 0.8–1.3)
Prothrombin Time Patient: 31.4 s — ABNORMAL HIGH (ref 10.7–15.6)

## 2014-05-15 ENCOUNTER — Ambulatory Visit: Payer: No Typology Code available for payment source | Admitting: Pharmacist

## 2014-05-15 DIAGNOSIS — Z952 Presence of prosthetic heart valve: Secondary | ICD-10-CM

## 2014-05-15 DIAGNOSIS — Z7901 Long term (current) use of anticoagulants: Secondary | ICD-10-CM

## 2014-05-15 NOTE — Telephone Encounter (Signed)
ANTICOAGULATION TREATMENT PLAN    Indication: MVR; recurrent CVA  Goal INR: 2.5-3.5  Duration of Therapy: chronic    Hemorrhagic Risk Score: 1  Warfarin Tablet Size: 5mg     Relevant Historic Information: CVA 2008; 2005; 2006    Referring Provider: Nadene Rubins    SUBJECTIVE:   Walter Rice was last evaluated by the anticoagulation clinic on 03/27/14 and instructed to continue warfarin 5mg  Mon and 7.5mg  all other days.    Today, pt reports no unusual bruising/bleeding. Diet consistent. Pt took all prescribed doses. No acute illness or signs/sx of stroke noted by patient.  Activity level stable.  His girlfriend makes sure he keeps on track with his warfarin dosing.  Green vegetable intake stable (3-4 servings a week).        Present dose: Warfarin 5mg  Mon and 7.5mg  all other days (50mg /wk)    OBJECTIVE:     Relevant medication changes: None noted by pt.     LABS:   INR      3.3   05/12/2014  INR      3.0   04/13/2014  INR      3.3   03/24/2014  INR      4.5   01/22/2012      ASSESSMENT:   INR therapeutic in otherwise stable patient without any warfarin related concerns or events.    As no reported changes in diet, lifestyle, or medications since last ACC assessment, it is reasonable to continue current maintenance dose and continue frequency of INR monitoring.    PLAN:   1. Continue warfarin 5mg  Mon and 7.5mg  all other days of the week.   2. Return in 4 weeks (on 06/09/2014).   Patient verbally expressed understanding of the above plan.  Plan emailed to patient.        Terence Lux, PharmD, CACP

## 2014-05-18 ENCOUNTER — Other Ambulatory Visit (HOSPITAL_BASED_OUTPATIENT_CLINIC_OR_DEPARTMENT_OTHER): Payer: Self-pay | Admitting: Pharmacotherapy

## 2014-05-18 DIAGNOSIS — Z7901 Long term (current) use of anticoagulants: Secondary | ICD-10-CM

## 2014-05-18 DIAGNOSIS — Z8673 Personal history of transient ischemic attack (TIA), and cerebral infarction without residual deficits: Secondary | ICD-10-CM

## 2014-05-18 DIAGNOSIS — Z952 Presence of prosthetic heart valve: Secondary | ICD-10-CM

## 2014-05-18 MED ORDER — WARFARIN SODIUM 5 MG OR TABS
ORAL_TABLET | ORAL | Status: DC
Start: 2014-05-18 — End: 2014-11-15

## 2014-05-23 ENCOUNTER — Telehealth (HOSPITAL_BASED_OUTPATIENT_CLINIC_OR_DEPARTMENT_OTHER): Payer: Self-pay | Admitting: Pediatrics

## 2014-05-23 NOTE — Telephone Encounter (Signed)
CONFIRMED PHONE NUMBER: [phone]  CALLERS FIRST AND LAST NAME: Walter Rice    FACILITY NAME: n/a TITLE: n/a  CALLERS RELATIONSHIP:Self  RETURN CALL: Detailed message on voicemail only     SUBJECT: Cancellation/Reschedule   REASON FOR CANCELLATION: Other out of town  RESCHEDULED: YES, Date 07/06/14, Time 10am

## 2014-05-25 ENCOUNTER — Encounter (HOSPITAL_BASED_OUTPATIENT_CLINIC_OR_DEPARTMENT_OTHER): Payer: No Typology Code available for payment source | Admitting: Pediatrics

## 2014-06-05 ENCOUNTER — Emergency Department
Admission: EM | Admit: 2014-06-05 | Discharge: 2014-06-05 | Disposition: A | Discharge status: Home / Self Care | Payer: No Typology Code available for payment source | Attending: Anesthesiology | Admitting: Anesthesiology

## 2014-06-05 DIAGNOSIS — Z954 Presence of other heart-valve replacement: Secondary | ICD-10-CM | POA: Insufficient documentation

## 2014-06-05 DIAGNOSIS — R319 Hematuria, unspecified: Secondary | ICD-10-CM | POA: Insufficient documentation

## 2014-06-05 DIAGNOSIS — N289 Disorder of kidney and ureter, unspecified: Secondary | ICD-10-CM

## 2014-06-05 LAB — URINALYSIS COMPLETE, URN
Bacteria, URN: NONE SEEN
Bilirubin (Qual), URN: NEGATIVE
Epith Cells_Renal/Trans,URN: NEGATIVE /HPF
Epith Cells_Squamous, URN: NEGATIVE /LPF
Glucose Qual, URN: NEGATIVE mg/dL
Ketones, URN: NEGATIVE mg/dL
Leukocyte Esterase, URN: NEGATIVE
Nitrite, URN: NEGATIVE
Protein (Alb Semiquant), URN: NEGATIVE mg/dL
Specific Gravity, URN: 1.003 g/mL (ref 1.002–1.027)
WBC, URN: NEGATIVE /HPF
pH, URN: 8 (ref 5.0–8.0)

## 2014-06-05 LAB — ALKALINE PHOSPHATASE (TOTAL): Alkaline Phosphatase (Total): 57 U/L (ref 35–109)

## 2014-06-05 LAB — CBC, DIFF
% Basophils: 1 %
% Eosinophils: 6 %
% Immature Granulocytes: 0 %
% Lymphocytes: 36 %
% Monocytes: 6 %
% Neutrophils: 51 %
Absolute Eosinophil Count: 0.53 10*3/uL — ABNORMAL HIGH (ref 0.00–0.50)
Absolute Lymphocyte Count: 3.12 10*3/uL (ref 1.00–4.80)
Basophils: 0.05 10*3/uL (ref 0.00–0.20)
Hematocrit: 41 % (ref 38–50)
Hemoglobin: 14 g/dL (ref 13.0–18.0)
Immature Granulocytes: 0.02 10*3/uL (ref 0.00–0.05)
MCH: 32.1 pg (ref 27.3–33.6)
MCHC: 34.4 g/dL (ref 32.2–36.5)
MCV: 93 fL (ref 81–98)
Monocytes: 0.49 10*3/uL (ref 0.00–0.80)
Neutrophils: 4.38 10*3/uL (ref 1.80–7.00)
Platelet Count: 184 10*3/uL (ref 150–400)
RBC: 4.36 mil/uL — ABNORMAL LOW (ref 4.40–5.60)
RDW-CV: 13 % (ref 11.6–14.4)
WBC: 8.59 10*3/uL (ref 4.3–10.0)

## 2014-06-05 LAB — ALBUMIN, SERUM: Albumin: 4.5 g/dL (ref 3.5–5.2)

## 2014-06-05 LAB — PROTEIN (TOTAL): Protein (Total): 7 g/dL (ref 6.0–8.2)

## 2014-06-05 LAB — BASIC METABOLIC PANEL
Anion Gap: 6 (ref 4–12)
Calcium: 9.2 mg/dL (ref 8.9–10.2)
Carbon Dioxide, Total: 28 mEq/L (ref 22–32)
Chloride: 104 mEq/L (ref 98–108)
Creatinine: 0.88 mg/dL (ref 0.51–1.18)
GFR, Calc, African American: >60 mL/min (ref >59)
GFR, Calc, European American: >60 mL/min (ref >59)
Glucose: 83 mg/dL (ref 62–125)
Potassium: 4 mEq/L (ref 3.6–5.2)
Sodium: 138 mEq/L (ref 135–145)
Urea Nitrogen: 12 mg/dL (ref 8–21)

## 2014-06-05 LAB — BILIRUBIN (TOTAL): Bilirubin (Total): 0.4 mg/dL (ref 0.2–1.3)

## 2014-06-05 LAB — PROTHROMBIN & PTT
Partial Thromboplastin Time: 48 s — ABNORMAL HIGH (ref 22–35)
Prothrombin INR: 3.5 — ABNORMAL HIGH (ref 0.8–1.3)
Prothrombin Time Patient: 33.3 s — ABNORMAL HIGH (ref 10.7–15.6)

## 2014-06-05 LAB — ALT & AST
ALT (GPT): 13 U/L (ref 10–64)
AST (GOT): 22 U/L (ref 9–38)

## 2014-06-06 ENCOUNTER — Ambulatory Visit: Payer: No Typology Code available for payment source | Attending: Internal Medicine | Admitting: Internal Medicine

## 2014-06-06 ENCOUNTER — Encounter (HOSPITAL_BASED_OUTPATIENT_CLINIC_OR_DEPARTMENT_OTHER): Payer: Self-pay | Admitting: Pharmacotherapy

## 2014-06-06 VITALS — BP 100/62 | HR 67 | Temp 98.1°F | Wt 167.3 lb

## 2014-06-06 DIAGNOSIS — R310 Gross hematuria: Secondary | ICD-10-CM

## 2014-06-06 DIAGNOSIS — Z7901 Long term (current) use of anticoagulants: Secondary | ICD-10-CM

## 2014-06-06 DIAGNOSIS — Z952 Presence of prosthetic heart valve: Secondary | ICD-10-CM

## 2014-06-06 LAB — BASIC METABOLIC PANEL
Anion Gap: 6 (ref 4–12)
Calcium: 9.9 mg/dL (ref 8.9–10.2)
Carbon Dioxide, Total: 30 mEq/L (ref 22–32)
Chloride: 102 mEq/L (ref 98–108)
Creatinine: 0.98 mg/dL (ref 0.51–1.18)
GFR, Calc, African American: >60 mL/min (ref >59)
GFR, Calc, European American: >60 mL/min (ref >59)
Glucose: 76 mg/dL (ref 62–125)
Potassium: 5.2 mEq/L (ref 3.6–5.2)
Sodium: 138 mEq/L (ref 135–145)
Urea Nitrogen: 13 mg/dL (ref 8–21)

## 2014-06-06 LAB — CBC (HEMOGRAM)
Hematocrit: 44 % (ref 38–50)
Hemoglobin: 15 g/dL (ref 13.0–18.0)
MCH: 31.8 pg (ref 27.3–33.6)
MCHC: 34.2 g/dL (ref 32.2–36.5)
MCV: 93 fL (ref 81–98)
Platelet Count: 200 10*3/uL (ref 150–400)
RBC: 4.71 mil/uL (ref 4.40–5.60)
RDW-CV: 12.4 % (ref 11.6–14.4)
WBC: 7.92 10*3/uL (ref 4.3–10.0)

## 2014-06-06 LAB — PR MEAS POST-VOIDING RESIDUAL URINE&/BLADDER CAP

## 2014-06-06 NOTE — Progress Notes (Signed)
ED VISIT  06/06/14  - ANTICOAGULATION SUMMARY    ANTICOAGULATION TREATMENT PLAN    Indication: MVR; recurrent CVA  Goal INR: 2.5-3.5  Duration of Therapy: chronic    Hemorrhagic Risk Score: 1  Warfarin Tablet Size: 5mg     Relevant Historic Information: CVA 2008; 2005; 2006    Referring Provider: Nadene Rubins    INTERVAL HISTORY    Walter Rice was last evaluated by [facility] on 05/15/14 .  His  INR was 3.3 and he was instructed to continue warfarin 5mg  Mon and 7.5mg  all other days     On 06/05/14 he presented to the [name] ED with asymptomatic hematuria.  There was no evidence of UTI, and HCT was stable at 41%.    CT of abdomen and pelvis revealed bilateral caliceal filling defects consistent with blood clot vs sloughed papilla. He was discharged home with self care, and referred to Urology Clinic.  He will be seen by Dr Oren Bracket today at [facility].       PLAN:  1) pt remains on warfarin 5mg  Mon and 7.5mg  all other days   2) will return to [name] on 06/16/14 as previously planned  3) awaiting assessment by Dr Jimmie Molly later today    Melinda Crutch, PharmD, CACP

## 2014-06-06 NOTE — Patient Instructions (Addendum)
1. Drink water!    2. Make appointment with urologist when they call    3. Go to ER if you cannot pee or fever or pain in back right below ribs    4. No aspirin or ibuprofen

## 2014-06-06 NOTE — Progress Notes (Signed)
This note was dictated by Keyvon Herter Lynn.

## 2014-06-07 NOTE — Progress Notes (Signed)
MALON, BRANTON          [id number]          06/06/2014      GENERAL INTERNAL MEDICINE CLINIC NOTE       IDENTIFICATION     The patient is a 28 year old here today to follow up for gross hematuria.    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  On 06/05/2014, he had the spontaneous onset of gross hematuria.  This started in the morning without any fever or flank pain.  When this persisted throughout the day,  he sought evaluation in the emergency room.      There he had a urinalysis that showed 9 to 30 red blood cells.  He had no protein, no nitrite, no leukocyte esterase, and no white blood cells in his urine.  He also had no bacteria.  Renal function was normal with creatinine of 0.88 and his hematocrit was normal at 41.  His INR was 3.5.  He also underwent CT scan of the abdomen and pelvis with contrast, which showed nonobstructing filling defects within the calices of the renal collecting systems bilaterally, likely representing blood clots, but less likely representing sloughing papillae. There was no urolithiasis, no hydroureter or hydronephrosis.    The patient reports that his hematuria persisted throughout the day today.  He has been drinking water.      Again, he denies any trauma,  back pain, abdominal pain,  fevers, chills.  He feels otherwise his normal self.  He has no personal or family history of sickle cell disease, diabetes, or kidney problems.  He has not been taking aspirin or ibuprofen.  He is on no medications other than coumadin.    Current Outpatient Prescriptions   Medication Sig Dispense Refill   . Acetaminophen 500 MG Oral Tab Take 2 tablet by mouth every 8 hours if needed to relieve pain 60 Tab prn   . Cyclobenzaprine HCl 5 MG Oral Tab 5 mg by mouth three times daily as needed Muscle Spasm.     . Lidocaine 5 % External Patch (topical  film) 1 patch Transdermal Daily.     . Warfarin Sodium 5 MG Oral Tab Take one tablet ( ) by mouth every Monday, and take 1&1/2 tablets (7.5mg ) on all other days or as directed by [facility]. 130 tablet 1     No current facility-administered medications for this visit.         PHYSICAL EXAMINATION     BP 100/62 mmHg  Pulse 67  Temp(Src) 98.1 F (36.7 C) (Temporal)  Wt 167 lb 4.8 oz (75.887 kg)  SpO2 95%    GENERAL:  The patient is a well-appearing male.  VITAL SIGNS:  Blood pressure 100/62, weight 167, BMI of 22.69, temperature 98.1, pulse 67 and regular.  CARDIOVASCULAR:  Remarkable for mechanical heart sounds.  BACK:  Without CVA tenderness, and he has no suprapubic tenderness.    LABORATORY DATA     On evaluation in clinic, the patient had gross hematuria on urine, which unfortunately was not saved for further evaluation.  His PVR was 48.    ASSESSMENT AND PLAN     Gross hematuria.  The patient is anticoagulated for a mechanical mitral valve with an INR of 3.5, presenting with gross hematuria related to blood clots in both his renal collecting systems versus  sloughed papillae.  The DDX includes spontaneously bleeding, unrecognized trauma, glomerular disease, papillary necrosis, ischemia or infection  Given his African-American race,  will check a hemoglobin electrophoresis to rule out sickle cell disease.  The patient does not take NSAID.  He has no evidence of infection either by history or on urinalysis yesterday in the ER.  He has no evidence of glomerular disease with the absence of proteinuria.  He is therapeutically anticoagulated, but septic emboli are always a possibility given his mechanical valve, although he has had no recent symptoms of infection.    PLAN     I contacted both Urology and Nephrology today. He has no current evidence of obstruction either by CT or PVR today.  He has no difficulty urinating spontaneously.   He will push fluids with the hope that the bleeding will stop spontaneously.   The patient was referred to Urology urgently through the ER last night and they will schedule an appointment with him sometime this week.  The patient was instructed to seek urgent care should he have the inability to urinate, develop fever or flank pain. Recheck Hct and renal function today.  Tried to repeat UA and culture but specimen not kept.

## 2014-06-08 LAB — HB ELECT. (ISOELECT FOCUSING): Hb Electrophoresis Result: NORMAL

## 2014-06-09 ENCOUNTER — Telehealth (HOSPITAL_BASED_OUTPATIENT_CLINIC_OR_DEPARTMENT_OTHER): Payer: Self-pay

## 2014-06-16 ENCOUNTER — Telehealth (HOSPITAL_BASED_OUTPATIENT_CLINIC_OR_DEPARTMENT_OTHER): Payer: Self-pay

## 2014-06-16 ENCOUNTER — Other Ambulatory Visit (HOSPITAL_BASED_OUTPATIENT_CLINIC_OR_DEPARTMENT_OTHER): Payer: No Typology Code available for payment source

## 2014-06-16 ENCOUNTER — Ambulatory Visit
Admit: 2014-06-16 | Discharge: 2014-06-16 | Disposition: A | Discharge status: Home / Self Care | Payer: No Typology Code available for payment source | Attending: Pharmacist | Admitting: Pharmacist

## 2014-06-16 DIAGNOSIS — Z952 Presence of prosthetic heart valve: Secondary | ICD-10-CM

## 2014-06-16 DIAGNOSIS — Z954 Presence of other heart-valve replacement: Secondary | ICD-10-CM | POA: Insufficient documentation

## 2014-06-16 DIAGNOSIS — Z8673 Personal history of transient ischemic attack (TIA), and cerebral infarction without residual deficits: Secondary | ICD-10-CM

## 2014-06-16 DIAGNOSIS — Z7901 Long term (current) use of anticoagulants: Secondary | ICD-10-CM | POA: Insufficient documentation

## 2014-06-16 LAB — PROTHROMBIN TIME
Prothrombin INR: 3 — ABNORMAL HIGH (ref 0.8–1.3)
Prothrombin Time Patient: 29.4 s — ABNORMAL HIGH (ref 10.7–15.6)

## 2014-06-19 ENCOUNTER — Ambulatory Visit: Payer: No Typology Code available for payment source | Admitting: Pharmacist

## 2014-06-19 DIAGNOSIS — Z952 Presence of prosthetic heart valve: Secondary | ICD-10-CM

## 2014-06-19 DIAGNOSIS — Z7901 Long term (current) use of anticoagulants: Secondary | ICD-10-CM

## 2014-06-19 NOTE — Telephone Encounter (Signed)
ANTICOAGULATION TREATMENT PLAN    Indication: MVR; recurrent CVA  Goal INR: 2.5-3.5  Duration of Therapy: chronic    Hemorrhagic Risk Score: 1  Warfarin Tablet Size: 5mg     Relevant Historic Information: CVA 2008; 2005; 2006    Referring Provider: Nadene Rubins    SUBJECTIVE:   Walter Rice was last evaluated by the anticoagulation clinic on 05/15/14 and instructed to continue warfarin 5mg  Mon and 7.5mg  all other days (50mg /wk).     Walter Rice was evaluated in the ER at [facility]  with asymptomatic hematuria on 06/05/14. There was no evidence of UTI, and HCT was stable at 41%.    CT of abdomen and pelvis revealed bilateral caliceal filling defects consistent with blood clot vs sloughed papilla. Walter Rice was discharged home with self care, and referred to Urology Clinic.    Pt notes at the time of the ER visit for hematuria, Walter Rice recalls having alcohol for several days in a row prior to that that may explain the INR trending to 3.5 8/31.     Today, pt reports hematuria has mostly resolved. Diet consistent. Pt took all prescribed doses. No acute illness or signs/sx of stroke noted by patient.  Activity level good.  Green vegetable intake consistent at 3-4 servings a week.  Walter Rice has been sneezing a lot and is unsure if it is allergy mediated.        Present dose: Warfarin 5mg  Mon and 7.5mg  all other days    OBJECTIVE:     Relevant medication changes: None noted. Pt tried Benadryl for sneezing and reports this made him too drowsy.    LABS:   INR      3.0   06/16/2014  INR      3.5   06/05/2014  INR      3.3   05/12/2014  INR      4.5   01/22/2012      ASSESSMENT:   INR therapeutic in pt with resolving hematuria.  Requested pt follow up with urology clinic regarding the referral that was made a couple weeks ago.  Suggested pt try non-sedating antihistamine for possible allergy related symptoms.      PLAN:   1. Continue warfarin 5mg  Mon and 7.5mg  all other days (50mg /wk)   2. Return in 5 weeks (on 07/21/2014).   Patient verbally expressed  understanding of the above plan.        Terence Lux, PharmD, CACP

## 2014-06-20 ENCOUNTER — Telehealth (HOSPITAL_BASED_OUTPATIENT_CLINIC_OR_DEPARTMENT_OTHER): Payer: Self-pay | Admitting: Cardiology Med

## 2014-06-20 NOTE — Telephone Encounter (Signed)
CONFIRMED PHONE NUMBER: [phone]  CALLERS FIRST AND LAST NAME: [name]  FACILITY NAME: n/a TITLE: n/a  CALLERS RELATIONSHIP:Self  RETURN CALL: Detailed message on voicemail only     SUBJECT: Cancellation/Reschedule   REASON FOR CANCELLATION: Family/work conflict  RESCHEDULED: NO CCR unable to see template. Patient would like to reschedule the appt at 1pm, as he has a Funeral at the same time. Please call the patient back to reschedule. Thank You.

## 2014-06-21 ENCOUNTER — Other Ambulatory Visit (HOSPITAL_COMMUNITY): Payer: Self-pay

## 2014-06-21 ENCOUNTER — Ambulatory Visit: Payer: No Typology Code available for payment source | Attending: Cardiovascular Disease

## 2014-06-21 ENCOUNTER — Encounter (HOSPITAL_BASED_OUTPATIENT_CLINIC_OR_DEPARTMENT_OTHER): Payer: No Typology Code available for payment source | Admitting: Cardiology Med

## 2014-06-21 DIAGNOSIS — Z954 Presence of other heart-valve replacement: Secondary | ICD-10-CM | POA: Insufficient documentation

## 2014-06-21 DIAGNOSIS — Q212 Atrioventricular septal defect, unspecified as to partial or complete: Secondary | ICD-10-CM | POA: Insufficient documentation

## 2014-07-06 ENCOUNTER — Ambulatory Visit: Payer: No Typology Code available for payment source | Attending: Pediatrics | Admitting: Pediatrics

## 2014-07-06 ENCOUNTER — Encounter (HOSPITAL_BASED_OUTPATIENT_CLINIC_OR_DEPARTMENT_OTHER): Payer: Self-pay | Admitting: Pediatrics

## 2014-07-06 VITALS — BP 112/69 | HR 68 | Ht 72.0 in | Wt 168.2 lb

## 2014-07-06 DIAGNOSIS — I495 Sick sinus syndrome: Secondary | ICD-10-CM | POA: Insufficient documentation

## 2014-07-06 NOTE — Progress Notes (Signed)
I saw and evaluated the patient. I have reviewed the resident's documentation and agree with it.  I spent a total time of over 30 minutes face-to-face with the patient, of which more than 50% of that time was spent in care coordination and counseling as outlined in this note.

## 2014-07-06 NOTE — Progress Notes (Signed)
[facility] Cardiology Clinic  Visit date: 07/06/2014    Primary Care Physician: Nelson Chimes [facility])  Attending Electrophysiologist Willow Ora  Electrophysiology Fellow: Jinger Neighbors, MD     Chief Complaint   Follow-up pacemaker   Sick sinus syndrome   History of palpitations      Patient Active Problem List    Diagnosis Date Noted   . 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 A) Status post repair with ventricular septal defect closure in February 1989. 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 Jude prosthetic valve in 1993.  TX FROM [place]     . 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       . 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  TX FROM [place]     . Headache [R51]      TX FROM [place]     . 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 Chronic anticoagulation [Z79.01] 02/07/2013     [place] ACC ENROLLED     . 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 CVA (cerebral vascular accident) [I63.9] 09/10/2011   . Sprain of ankle- Right 06/2011 [S93.409A] 06/16/2011        History  28 year old male with an atrioventricular septal defect and anomalous pulmonary venous return repaired in 1989 with subsequent mechanical mitral valve replacement in 1993.  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  He continues to be atrially paced 100% of the time and has an underlying escape  rhythm in the 40s to 50s.     He returns for pacemaker follow-up today.  He feels well; he has no complaints.  He notes inadequate heart rate response with exercise which is neither to brisk nor to blunted.  He had a exercise treadmill test in July 2015 for palpitations during which he exercised 12 minutes 51 seconds, had a heart rate increased to 150 bpm, with some ventricular pacing.  He notes he could've gone further if allowed but he was stopped.    On interrogation today, atrial lead impedance is low although it has gradually declined over the length of the lesion we do not suspect an acute bleed issue.  We evaluated his capture thresholds at various pulse duration and reprogrammed him to a larger pulse duration and lower voltage in the atrial lead in an effort to prolong battery longevity.       Current Outpatient Prescriptions   Medication Sig Dispense Refill   . Acetaminophen 500 MG Oral Tab Take 2 tablet by mouth every 8 hours if needed to relieve pain 60 Tab prn   . Cyclobenzaprine HCl 5 MG Oral Tab 5 mg by mouth three times daily as needed Muscle Spasm.     . Lidocaine 5 % External Patch (topical film) 1 patch Transdermal Daily.     . Warfarin Sodium 5 MG Oral Tab Take one tablet (5mg ) by mouth every Monday, and take  1&1/2 tablets (7.5mg ) on all other days or as [facility]. 130 tablet 1     No current facility-administered medications for this visit.        Review of patient's allergies indicates:  Allergies   Allergen Reactions   . Sulfa Antibiotics Rash       His family history is not on file.    He  reports that he has been smoking.  He has never used smokeless tobacco. He reports that he does not drink alcohol or use illicit drugs.    Review of Systems:   All other systems were reviewed and are negative except as mentioned above    Physical Exam:  BP 112/69 mmHg  Pulse 68  Ht 6' (1.829 m)  Wt 168 lb 3.2 oz (76.295 kg)  BMI 22.81 kg/m2  SpO2 97%    General: Awake alert comfortable  nontoxic in no distress  Skin: Well-healed left sided pacemaker scar.     Studies reviewed:   Echocardiogram from 06/21/2014 was reviewed. This is notable for normal biventricular function, normal mechanical function, normal pulmonary artery pressures.     Device Interrogation:   Atrial lead impedance was noted to be 235 ohms which is below the normal cutoff of 300 ohms for this particular lead.  This has been a slow decline over the life of the lead when reviewing his trend.  Capture thresholds were above battery voltage in 0.4 ms pulse duration, therefore we tested in in in a pulse duration of 0.76 and were able to get a threshold of 1.25 mv, which is less than half his battery voltage. We repeated longevity estimates which increased his estimated duration from 18 months to 22 months and from a minimum of 6 months to 9 months.  We also changed the cutoff for ventricular high rate episode recordings from 150-180 given that he paces up to 180 in the rate responsive mode.  CIED 06/30/2013 03/09/2014 07/06/2014   Type PPM PPM PPM   Manufacturer Medtronic Medtronic Medtronic   Generator Model Adapta L Adapta L Adapta L   Date of Generator Implant 10/28/2006 10/28/2006 10/28/2006   Generator Serial Number [id number]   Lead #1 Type Atrial Atrial Atrial   Model # [id number]   Lead #1 Serial # [id number] V [id number] V [id number] V   Lead #1 Implant Date 11/17/1997 11/17/1997 11/17/1997   Lead #2 Type RV RV RV   Lead #2 Model # [id number]   Lead #2 Serial # [id number] V [id number] V [id number] V   Lead #2 Implant Date 11/17/1997 11/17/1997 11/17/1997   Date of Service - - 07/06/2014   Battery Voltage 2.76V 2.74 2.72   P-Wave 0 0 2.0   R-Wave 11.2 0 11.2   Atrial impedence 377 309 285   RV impedence 1495 1429 1459   Atrial Threshold 1.5V@0 .52 1.5V@0 .52 1.25@.76   RV Threshold 1V@0 .4 1V@0 .4 1.0@0 .4   Atrial Events 1 MS 1 MS -   Ventricular Events - 0 109   % atrial pacing 100 100 99.1   % ventricular pacing 0.3 0.3 0.1    Underlying Rhythm - CHB junctional 40   Mode AAIR<=>DDDR 60-180 AAIR<=>DDDR 60-180 AAIR 60-180       Impression:  28 year old male with a dual-chamber Medtronic pacemaker for transient heart block which is resolved and ongoing sinus node dysfunction with 100% atrial pacing.  Device function is normal with the notable exception of low atrial lead impedances but adequate capture thresholds. Ventricular  high rate episodes which are recorded on the device represent normal pacing, and settings were adjusted to not record episodes which occur due to normal rate responsive pacing.  We did adjust his output to increase his pulse duration and reduce voltage.     Recommendations:   Subtle pacemaker adjustments to atrial lead output were made as detailed above for the purposes of improving battery longevity   Follow-up in 6 months for device clinic   Anticipate he will need a generator change in the next one to 2 years

## 2014-07-07 ENCOUNTER — Ambulatory Visit: Payer: No Typology Code available for payment source | Attending: Pediatric Cardiology | Admitting: Pediatric Cardiology

## 2014-07-07 ENCOUNTER — Telehealth (HOSPITAL_BASED_OUTPATIENT_CLINIC_OR_DEPARTMENT_OTHER): Payer: Self-pay | Admitting: Rehabilitative and Restorative Service Providers"

## 2014-07-07 ENCOUNTER — Ambulatory Visit (HOSPITAL_BASED_OUTPATIENT_CLINIC_OR_DEPARTMENT_OTHER): Payer: No Typology Code available for payment source | Admitting: Internal Medicine

## 2014-07-07 ENCOUNTER — Encounter (HOSPITAL_BASED_OUTPATIENT_CLINIC_OR_DEPARTMENT_OTHER): Payer: Self-pay | Admitting: Pediatric Cardiology

## 2014-07-07 VITALS — BP 118/65 | HR 64 | Ht 72.0 in | Wt 167.0 lb

## 2014-07-07 VITALS — BP 114/66 | HR 72 | Temp 98.9°F | Wt 170.0 lb

## 2014-07-07 DIAGNOSIS — M545 Low back pain, unspecified: Secondary | ICD-10-CM

## 2014-07-07 DIAGNOSIS — Z23 Encounter for immunization: Secondary | ICD-10-CM | POA: Insufficient documentation

## 2014-07-07 DIAGNOSIS — J302 Other seasonal allergic rhinitis: Secondary | ICD-10-CM | POA: Insufficient documentation

## 2014-07-07 DIAGNOSIS — Q212 Atrioventricular septal defect, unspecified as to partial or complete: Secondary | ICD-10-CM

## 2014-07-07 MED ORDER — FLUTICASONE PROPIONATE 50 MCG/ACT NA SUSP
2.0000 | Freq: Every day | NASAL | Status: DC
Start: 2014-07-07 — End: 2015-12-04

## 2014-07-07 MED ORDER — CYCLOBENZAPRINE HCL 5 MG OR TABS
ORAL_TABLET | ORAL | Status: DC
Start: 2014-07-07 — End: 2021-06-13

## 2014-07-07 NOTE — Telephone Encounter (Signed)
CONFIRMED PHONE NUMBER: [phone] or [phone]  CALLERS FIRST AND LAST NAME: Walter Rice  FACILITY NAME: na TITLE: na  CALLERS RELATIONSHIP:Self  RETURN CALL: Detailed message on voicemail only     SUBJECT: Appointment Request   REASON FOR REQUEST: Patient called to schedule appointment, no scheduling instructions in referral. Please call patient back to advise.    Thank you!    REQUEST APPOINTMENT WITH: Any appropriate  REFERRING PROVIDER: Dedra Skeens LYNN  REQUESTED DATE: Tuesdays and Wednesdays  REQUESTED TIME: Evening  UNABLE TO APPOINT: Other: No instructions in referral

## 2014-07-07 NOTE — Progress Notes (Signed)
ACHD FOLLOW UP VISIT NOTE    Walter Rice is a 28 year old male who presents to ACHD clinic for routine follow up.    PROBLEM LIST  Patient Active Problem List    Diagnosis Date Noted   . 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 A) Status post repair with ventricular septal defect closure in February 1989. 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 Jude prosthetic valve in 1993.  [place]     . 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       . 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  [place] FROM [place]     . Headache [R51]      [place]     . Chronic anticoagulation [Z79.01] 02/07/2013      ACC ENROLLED     . CVA (cerebral vascular accident) [I63.9] 09/10/2011   . Sprain of ankle- Right 06/2011 [S93.409A] 06/16/2011     Walter Rice returns for a routine ACHD visit.  He was last seen in our clinic last year and in the interim has been well.  He saw his ACHD-EP doctor this week (Dr. Suella BroadSeslar) and was told that he is nearing the end of his pacer generator life and he should expect replacement in 1-2 years.  He also has been having low back pain and described an episode of self-resolved hematuria.  He has been working at an [employer] company and sits for prolonged periods of time.  He has not had a formal ergonomics evaluation.      From a cardiac perspective, Walter Rice has been doing well.  No cardiac symptoms.  No chest pain, shortness of breath, exercise intolerance,  palpitations, dizziness or syncope.  He has been on stable doses of coumadin for some time with in-range INRs.    Current Outpatient Prescriptions   Medication Sig Dispense Refill   . Acetaminophen 500 MG Oral Tab Take 2 tablet by mouth every 8 hours if needed to relieve pain 60  Tab prn   . Cyclobenzaprine HCl 5 MG Oral Tab 5 mg by mouth three times daily as needed Muscle Spasm.     . Lidocaine 5 % External Patch (topical film) 1 patch Transdermal Daily.     . Warfarin Sodium 5 MG Oral Tab Take one tablet (5mg ) by mouth every Monday, and take 1&1/2 tablets (7.5mg ) on all other days or as [facility]. 130 tablet 1     No current facility-administered medications for this visit.     Still smoking cigarettes.     ROS  Complete review of systems obtained, reviewed and is noted in the cardiology intake form.    PHYSICAL EXAM:   Filed Vitals:    07/07/14 0808   BP: 118/65   Pulse: 64   Height: 6' (1.829 m)   Weight: 167 lb (75.751 kg)   SpO2: 96%     General: Not in acute distress  HEENT: Sclera non-icteric, moist oral mucous membranes  Neck: No jugular venous distension  Lungs: Clear to auscultation bilaterally   CV: RRR mech S1 nl S2 I/VI SEM base with a soft I/IV DR at the apex.  II/IV high-pitched early DM along the lower left sternum. No gallop.  Abdomen: Soft, non-tender, non-distended, no hepatomegaly  Musculoskeletal/extremities:  No cyanosis, clubbing or edema.  2+ symmetric pulses in all 4 extremities.  His lumbar paraspinous muscled are TTP.  Skin:  Well healed median sternotomy scar, no other abnormal findings  Neuro:  Normal gait    DIAGNOSTIC STUDIES:  Echocardiogram was performed, reviewed and showed:  1. Normal left ventricular size, wall thickness and function (EF 62%). No residual VSD flow seen in the region of the inflow septum.   2. Well seated mechanical mitral valve prosthesis with normal function (mean 5 mmHg).   3. Trileaflet aortic valve with mild-moderate central regurgitation.   4. The tricuspid valve appears to be in the same plane as the MV prosthesis, consistent with history of AV canal defect.   5. Normal right ventricular size with low normal systolic function.   6. Normal central and pulmonary artery pressures.     All studies were reviewed with the  patient.      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  We reviewed his need for SBE prophylaxis and I want him to visit his dentist regularly.  I'd like to see him in a year with an echo to evaluate his prosthesis, degree of AI and his LV size and function.  He will continue to see Dr. Suella Broad for his EP needs.    In regards to the low back pain, I've recommended he see Occupation Health a his work place to get a formal evaluation.  If he has persisting pain, I recommended he see his PCP, Dr. Jimmie Molly.    Acquanetta Chain, MD  Adult Congenital Heart Disease

## 2014-07-07 NOTE — Progress Notes (Signed)
[facility]   CLINIC NOTE:    Chief Complaint   Patient presents with   . Back Pain   . Sinus Problem       Walter Rice is a 28 year old male who presents with the following complaints:     Back pain  Sneezing, rhinorrhea    Patient Active Problem List    Diagnosis Date Noted   . 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 A) Status post repair with ventricular septal defect closure in February 1989. 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 Jude prosthetic valve in 1993.  TX FROM [place]     . 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       . 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  TX FROM [place]     . Headache [R51]      [place]     . 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 Chronic anticoagulation [Z79.01] 02/07/2013     [place] [facility] ENROLLED     . CVA (cerebral vascular accident) [I63.9] 09/10/2011   . Sprain of ankle- Right 06/2011 [S93.409A] 06/16/2011     #Back pain: Walter Rice reports that he has been having pain in his lower back for the past 4 years. The pain comes and goes. 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 His pain is worsened throughout the day, but does persist at night as well and does interfere with his sleeping at times. He denies any radiation of pain from his back to his legs, denies weakness or numbness or incontinence. The back pain has been relatively worse recently, but this tends to wax and wane. He does not recall that he has ever been treated for this pain previously.     #Sneezing: For the past several weeks, has had lots of sneezing and rhinorrhea. He has never had these issues before nor has had seasonal allergies. His sister has  seasonal allergies. He denies shortness of breath or wheezing. Denies eye symptoms.     Current Outpatient Prescriptions   Medication Sig Dispense Refill   . Acetaminophen 500 MG Oral Tab Take 2 tablet by mouth every 8 hours if needed to relieve pain 60 Tab prn   . Cyclobenzaprine HCl 5 MG Oral Tab Take 1-2 tabs as needed for muscle spasms of back. Start by taking at night, can increase to three times per day. 60 tablet 3   . Fluticasone Propionate 50 MCG/ACT Nasal Suspension Spray 2 sprays into each nostril daily. 1 Inhaler 3   . Lidocaine 5 % External Patch (topical film) 1 patch Transdermal Daily.     . Warfarin Sodium 5 MG Oral Tab Take one tablet (5mg ) by mouth every Monday, and take 1&1/2 tablets (7.5mg ) on all other days or [facility]. 130 tablet 1     No current facility-administered medications for this visit.     Physical Examination:    weight is 170 lb (77.111 kg). His temporal temperature is 98.9 F (37.2 C). His blood pressure is 114/66 and his pulse is 72.   GEN: well-appearing, NAD  HEENT: sclera anicteric, non-injected conjunctiva  Back: Difficulty rising from chair due to pain. Tenderness to palpation of the bilateral paraspinal muscles of the lumbar back.   Neuro: 5/5 bilateral lower extremity strength with full sensation. 2+ patellar and achilles reflexes.       Assessment and Plan:   Walter Rice is here for care of the medical issues discussed below.     (M54.5) Bilateral low back pain without sciatica: Pain is most consistent with a muscle spasm or paraspinal muscle pathology.  This is most likely provoked by his repetitive bending forward and leaning backwards at work.  He will benefit from physical therapy and also prescribed him cyclobenzaprine to be able to use as needed for back spasms and pain.  I encouraged him to start using this only at night and see how he tolerates it especially that drowsiness associated with the medication.  -Cyclobenzaprine HCl 5 MG Oral  Tab, take 1-2 tabs as needed for back pain. Start by taking at night, if tolerating medication without significant drowsiness could try using during day also up to TID  -Referral to PT for back strengthening    #Allergic rhinitis: Symptoms are most consistent with seasonal allergies.   -Fluticasone 2 sprays each nostril daily, once daily. If having persistent symptoms, can increase to BID dosing.        (Z23) Need for prophylactic vaccination and inoculation against influenza  (primary encounter diagnosis)  Plan: influenza vaccine quadrivalent

## 2014-07-07 NOTE — Patient Instructions (Addendum)
We are going to do the following for the back pain:    1. You can take cyclobenzaprine 5-10 mg as needed for pain. I would recommend taking it at night, because it can cause sedation, but you can take it during the day as needed if not having too much drowsiness.     2. I am referring you to the physical therapist to discuss exercises you can do to help with your back pain.     For the sneezing, this is most likely due to allergies:    3. I would recommend starting the nasal fluticasone on a daily basis while you are having symptoms. You can start once per day, 2 sprays in each nostril. If symptoms persist, can increase to twice per day.

## 2014-07-07 NOTE — Progress Notes (Signed)
Flu Vaccine Screening Questionnaire:    1)  Is the person to be vaccinated sick today?  NO    2)  Does the person to be vaccinated have an allergy to eggs or to a component of the vaccine?  NO            3)  Has the person to be vaccinated ever had a serious reaction to a flu vaccine or another vaccine in the past? NO    4) Has the person to be vaccinated ever had Guillain-Barré syndrome? NO      If YES to any of the questions above - NO Flu Vaccine to be given.  Patient may consult provider as needed.    If NO to all questions above - Patient may receive Flu Shot (IM)      Immunization Documentation:    Patient/Parent has reviewed the appropriate VIS and has all questions answered? YES    Vaccine(s) given today without initial adverse effect? YES    Shad Ledvina B Toneka Fullen

## 2014-07-07 NOTE — Progress Notes (Signed)
-------------------------------------------    Attending: Armya Westerhoff L Aniqa Hare, MD  I have personally discussed the case with Dr. HARRIS, ANDREW WESLEY   during or immediately after the patient visit including review of history, physical exam, diagnosis and treatment plan.    -------------------------------------------

## 2014-07-21 ENCOUNTER — Telehealth (HOSPITAL_BASED_OUTPATIENT_CLINIC_OR_DEPARTMENT_OTHER): Payer: Self-pay

## 2014-07-21 ENCOUNTER — Ambulatory Visit
Admit: 2014-07-21 | Discharge: 2014-07-21 | Disposition: A | Discharge status: Home / Self Care | Payer: No Typology Code available for payment source | Attending: Pharmacist | Admitting: Pharmacist

## 2014-07-21 ENCOUNTER — Other Ambulatory Visit (HOSPITAL_BASED_OUTPATIENT_CLINIC_OR_DEPARTMENT_OTHER): Payer: No Typology Code available for payment source

## 2014-07-21 DIAGNOSIS — Z8673 Personal history of transient ischemic attack (TIA), and cerebral infarction without residual deficits: Secondary | ICD-10-CM

## 2014-07-21 DIAGNOSIS — Z7901 Long term (current) use of anticoagulants: Secondary | ICD-10-CM

## 2014-07-21 DIAGNOSIS — Z952 Presence of prosthetic heart valve: Secondary | ICD-10-CM

## 2014-07-21 DIAGNOSIS — Z954 Presence of other heart-valve replacement: Secondary | ICD-10-CM | POA: Insufficient documentation

## 2014-07-21 LAB — PROTHROMBIN TIME
Prothrombin INR: 3.7 — ABNORMAL HIGH (ref 0.8–1.3)
Prothrombin Time Patient: 34.6 s — ABNORMAL HIGH (ref 10.7–15.6)

## 2014-07-24 ENCOUNTER — Ambulatory Visit: Payer: No Typology Code available for payment source | Admitting: Pharmacist

## 2014-07-24 DIAGNOSIS — Z7901 Long term (current) use of anticoagulants: Secondary | ICD-10-CM

## 2014-07-24 DIAGNOSIS — Z952 Presence of prosthetic heart valve: Secondary | ICD-10-CM

## 2014-07-24 NOTE — Telephone Encounter (Signed)
BLOOD DRAW LOCATION: [address]     06/30/13:  If cannot reach Walter Rice, can try Tiffany at [phone]. [place] gave verbal authorization to discuss his anticoagulation management with Tiffany 06/30/13.      Email aunt Windell Moulding and sister [name] with instructions after each visit.   Kim cell [phone] or work [phone] (try FIRST); Ruth:[phone]; [email or web address], [email or web address]   Sylar @ job skills [phone] M-F   No Coumadin day of INR.    ANTICOAGULATION TREATMENT PLAN    Indication: MVR; recurrent CVA  Goal INR: 2.5-3.5  Duration of Therapy: chronic    Hemorrhagic Risk Score: 1  Warfarin Tablet Size: 5mg     Relevant Historic Information: CVA 2008; 2005; 2006    Referring Provider: Nadene Rubins    SUBJECTIVE:  Pt was last evaluated by [facility] on 06/19/2014 at which time the INR was therapeutic and no changes were made to the warfarin regimen.  Pt was scheduled for follow-up appt in 5 weeks.    I spoke with pt about INR result from 07/21/14. Today pt denies any unusual bruising or bleeding, and denies signs/sxs of TIA/stroke. He denies changes to diet but does admit to increased alcohol consumption, including this past weekend.    WARFARIN DOSE:   Warfarin 5mg  Mon and 7.5mg  all other days (50mg /wk).  No warfarin dosing errors reported.    OBJECTIVE:  Relevant medication changes: None reported.     LABS:   INR      3.7   07/21/2014  INR      3.0   06/16/2014  INR      3.5   06/05/2014    ASSESSMENT:   INR is slightly elevated, possibly due to increase in alcohol consumption.    PLAN:   1.  HOLD warfarin 5mg  today, then resume 5mg  Mondays and 7.5mg  on all others.  2.  Return in 2 weeks (on 08/04/2014).  3.  Pt. expressed understanding of plan outlined above. Plan emailed to family members as requested.    Alfred Levins, PharmD

## 2014-08-04 ENCOUNTER — Ambulatory Visit: Payer: No Typology Code available for payment source | Attending: Internal Medicine | Admitting: Physical Therapist

## 2014-08-04 ENCOUNTER — Other Ambulatory Visit (HOSPITAL_BASED_OUTPATIENT_CLINIC_OR_DEPARTMENT_OTHER): Payer: No Typology Code available for payment source

## 2014-08-04 ENCOUNTER — Ambulatory Visit: Payer: No Typology Code available for payment source | Attending: Pharmacist | Admitting: Pharmacist

## 2014-08-04 DIAGNOSIS — M545 Low back pain, unspecified: Secondary | ICD-10-CM

## 2014-08-04 DIAGNOSIS — M629 Disorder of muscle, unspecified: Secondary | ICD-10-CM | POA: Insufficient documentation

## 2014-08-04 DIAGNOSIS — Z7901 Long term (current) use of anticoagulants: Secondary | ICD-10-CM | POA: Insufficient documentation

## 2014-08-04 DIAGNOSIS — Z8673 Personal history of transient ischemic attack (TIA), and cerebral infarction without residual deficits: Secondary | ICD-10-CM

## 2014-08-04 DIAGNOSIS — Z954 Presence of other heart-valve replacement: Secondary | ICD-10-CM | POA: Insufficient documentation

## 2014-08-04 DIAGNOSIS — M6281 Muscle weakness (generalized): Secondary | ICD-10-CM | POA: Insufficient documentation

## 2014-08-04 DIAGNOSIS — Z952 Presence of prosthetic heart valve: Secondary | ICD-10-CM

## 2014-08-04 LAB — PROTHROMBIN TIME
Prothrombin INR: 3.2 — ABNORMAL HIGH (ref 0.8–1.3)
Prothrombin Time Patient: 31 s — ABNORMAL HIGH (ref 10.7–15.6)

## 2014-08-04 NOTE — Telephone Encounter (Signed)
ANTICOAGULATION TREATMENT PLAN    Indication: MVR; recurrent CVA  Goal INR: 2.5-3.5  Duration of Therapy: chronic    Hemorrhagic Risk Score: 1  Warfarin Tablet Size: 5mg     Relevant Historic Information: CVA 2008; 2005; 2006    Referring Provider: Nadene Rubins    SUBJECTIVE:  Pt was last evaluated by [facility] on 07/24/2014 at which time the INR on 10/16 was slightly elevated at 3.7 due to increased alcohol consumption.  Pt was scheduled for follow-up appt in 2 weeks.    Today pt denies any unusual bruising or bleeding, and denies signs/sxs of TIA/stroke.     No changes reported to diet or activity level since last [facility] visit. He had one beer last week.     WARFARIN DOSE:   Warfarin HELD on 10/19, then resumed 5mg  Mon and 7.5mg  all other days (50mg /wk). No warfarin dosing errors reported.    OBJECTIVE:  Relevant medication changes: None reported.     LABS:   INR      3.2   08/04/2014  INR      3.7   07/21/2014  INR      3.0   06/16/2014      ASSESSMENT:   Therapeutic INR on current warfarin dose without warfarin related complications. Will extend time between visits.    PLAN:   1.  Continue current warfarin dose of 5mg  Mon and 7.5mg  all other days (50mg /wk).  2.  Return in 5 weeks (on 09/08/2014). Pt prefers Friday. Suggested that pt come before or right after Thanksgiving but he insisted to wait until 09/08/14 (a Friday).   3.  Pt. expressed understanding of plan outlined above. Plan emailed to his sister Walter Rice) as requested.    Alfred Levins, PharmD

## 2014-08-04 NOTE — Progress Notes (Signed)
Physical Therapy Progress Note   Time in: 9:00 a.m.         Duration: 30 minutes    Skilled services/interventions provided: Therapeutic exercise for 30 minutes.     Gave a home program as part of his initial evaluation including;    Tissue work seated with a ball and or a stick.  Work on forward bending from hips and not from mid back.  Supine hamstring and calf stretching combination.  Supine neutral position with head supported by pillow, knees supported by a block and activating multifidi and transverse abdominals.  Seated neutral spine position as well.    Patient's response to therapy: Walter Rice was able to demonstrate all of the above with good form and awareness but after a great deal of cueing.    Education/instruction: verbal instructions and demonstrations were provided for the above exercises, a handout was provided describing the exercises in written and picture format    Arline Asp, PTA

## 2014-08-04 NOTE — Progress Notes (Signed)
Physical Therapy Evaluation       Report Date: From: 08/04/2014 through 09/03/2014    Time in: 8:30 AM                   Duration: 45 minutes  Today's date: 08/04/2014  Onset date: July 07, 2014  Referring Provider: Nelson Chimes  Referring provider NPI: [id number]    Treatment/Therapy diagnosis: Midline Low back pain without lower extremity radiation , trunk weakness,  tightness of both hamstrings   The following patient identifiers were confirmed today: name and date of birth.    Order:  Evaluate and treat   Initial assessment:  Reason for referral: Patient is a 28 year old male who developed low back pain for years ago.  He had a recent exacerbation about June 06, 2014.  He reports that he has severe low back pain and he has difficulty completing his work related activities.  He works with a Air cabin crew at work and has to be constantly forward bending and extending throughout the day from a seated position.    The patient believes that it is the constant forward bending and extending that is causing his problems.  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     The patient reports that he used to be very active at the gym, doing weight lifting with free weights as well as machines and cables for the upper body as well as the lower body.  He reports that he does not do any stretching exercises.    The patient has had about 10 surgeries around the cardiothoracic area.  He has a long postsurgical scar along the sternum into the upper abdominal area.  He also has had 3 CVA episodes which she reports did not leave any neurological deficits.    His impairments and functional deficits are follows:      IMPAIRMENTS FUNCTIONAL DEFICITS LONG TERM GOALS   1.  Tissue extensibility dysfunction hamstrings, calf muscles, posterior myofascial line lower extremity.    1.  The patient has difficulty getting in and out of bed  secondary to increased low back pain and repetitive strain on the low back area    1.  The patient will begin able to get in and out of bed without increase in back pain by November 20, 2014      2.  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 the patient is unable to lift and carry heavy loads  2.  He will be able to lift up to 50 pounds without any increase in low back pain by November 20, 2014    3.  Joint mobility dysfunction the lumbar spine with restriction going into flexion as well as extension patterns.   3.  The patient has difficulty with forward reaching activities at work  3. 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      The patient reports that he was not aware that there is stabilizer muscles that he needs to exercise.  He reports that he does not stretch his lower extremities.  The patient is reporting 6/10 level of pain, on a 0-10 Numeric Pain Distress Scale.                     Barriers to learning: none                   Preferred Learning style: demonstration, written and verbal         Patient Active Problem List     Diagnosis  Date Noted    .  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 A) Status post repair with ventricular septal defect closure in February 1989. 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 Jude prosthetic valve in 1993.  TX FROM [address]   .  Sinus node dysfunction [I49.5]       A) Status post Medtronic dual-chamber permanent pacemaker.     .  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  TX FROM [place]   .  Headache [R51]       TX  FROM [place]   .  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  Chronic anticoagulation [Z79.01]  02/07/2013      [place] [facility] ENROLLED   .  CVA (cerebral vascular accident) [I63.9]  09/10/2011    .  Sprain of ankle- Right 06/2011 [S93.409A]  06/16/2011          Fall Risk: The patient has not fallen in the past year.    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  Level of function at start of care: Currently, the patient is unable to work with a Midwife without severe pain.  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  Patient has difficulty even in transferring in and out of bed.     Cultural Practices that Influence Care: The patient has had several cardiothoracic surgeries.  He has only been working for a few years.    Objective:  Observation: Relatively flat thoracic spine.  depressed anterior rib cage.  He appears very anxious during the treatment session  Impairments:       Range of motion:       Right Left   Hip flexion Active:90   Passive: 100  Active:90   Passive: 100    Hip extension Active:0   Passive: 5  Active:0   Passive: 5    Hip external rotation Active:35   Passive: 40  Active:35   Passive: 40    Hip Internal  rotation Active:25   Passive: 30  Active:25   Passive: 30    Straight Leg Raise: Movement is being restricted by low back pain.   Active:20   Passive: 35  Active:20   Passive: 35    Prone knee flexion Active:80   Passive: 90  Active:80   Passive: 90       Standing forward flexion: Fingertip to floor measurement: 7 inches    Standing Backward extension: 20 pain   trunk rotation right: 45, trunk rotation left 45, with the patient deviating into flexion  Muscle Strength: 3-/5 transverse  abdominal muscles, gluteal muscles, hip abductors, hip external rotators.        Sensation: intact        Reflexes: intact        Special test: patient is unable to go into prone extension secondary to pain.   Patient difficulty transferring from sitting to supine, supine to sitting and rolling in bed.     Prone position that were able to put them into his initiation of exercises once a supine position with the head elevated both legs supported on a platform   Poor activation of the multifidus muscles.        Interventions/Treatment provided: Therapeutic exercise for 15 minutes and Physical Therapy evaluation for 30 minutes.   therapeutic exercises  consisted of supine with the head and upper thoracic supported and elevated, legs elevated on a platform the hip flexors; performing posterior pelvic tilt, isometric contraction of the transverse abdominal muscles and the multifidus muscles.  Maintaining to neutral positioning lumbar spine and pelvis with alternate leg hip flexion knee flexion, hip flexion and extension; alternate leg with rotation.  Progressive the session provided by the physical therapist assistant for lower extremity flexibility      Home exercise instruction: Verbal instructions and demonstrations provided for the above exercises., The patient was able to return demonstrate the exercises accurately after instructions., The patient needs verbal cues during return demonstration of the exercises.  The patient was able to communicate understanding of the instructions and precautions.    Plan of care:  The patient will benefit from a physical therapy  program addressing core stabilization exercises , preferably with the back supported to initiate activity of the deep stabilizers including the lumbar spine, pelvis, thoracic spine.  Progressing lower extremity flexibility exercises.    This patient will be seen for 12  therapy sessions within 12 weeks.  The patient will perform the instructed home program  2 times per day.  The therapy sessions may include therapeutic exercise, manual therapy and self-care home management training  This patient will recheck with the referring provider in 6 weeks.     ASSESSMENT: Rehabilitation potential is: good   These goals were discussed and  the patient agrees with them.  This patient will be seen by a physical therapist or a physical therapist assistant following this plan of care.    Gus PumaEmmanuel B Zhaniya Swallows, PT    This note was generated in part using voice recognition software. Although every effort is made to edit the content, transcription errors may occur.

## 2014-08-11 ENCOUNTER — Ambulatory Visit
Payer: No Typology Code available for payment source | Attending: Internal Medicine | Admitting: Rehabilitative and Restorative Service Providers"

## 2014-08-11 DIAGNOSIS — M629 Disorder of muscle, unspecified: Secondary | ICD-10-CM | POA: Insufficient documentation

## 2014-08-11 DIAGNOSIS — M545 Low back pain, unspecified: Secondary | ICD-10-CM

## 2014-08-11 DIAGNOSIS — M6281 Muscle weakness (generalized): Secondary | ICD-10-CM | POA: Insufficient documentation

## 2014-08-11 NOTE — Progress Notes (Signed)
Physical Therapy Progress Note.  Time in: 10:00 AM                     Duration: 30 minutes  Today's date: 08/11/2014  Onset date: July 07, 2014  Referring Provider: Nelson Chimeseborah Lynn Greenberg    Referring provider NPI: [id number]  [facility]    Treatment/Therapy diagnosis: Midline Low back pain without lower extremity radiation , trunk weakness,  tightness of both hamstrings   The following patient identifiers were confirmed today: name and date of birth.    Pain level:  The patient is reporting 6/10 level of pain, on a 0-10 Numeric Pain Distress Scale. Just last night he had the worst pain he had ever had in his life.  He rates it at a 15/10.  He reports a burning sensation on both sides of his lower back and a sharp pain. He felt his body trying to shift away from the pain and he felt crooked and could not get up right until he layed down and rested.    Interventions/Treatment provided: Therapeutic exercise for 30 minutes.    Therapeutic exercise review consisted of;    Supine with the head and upper thoracic supported and elevated, legs elevated on a platform the hip flexors; performing posterior pelvic tilt, isometric contraction of the transverse abdominal muscles and the multifidus muscles.      Patient on his own added prone lying with pillows under his arms.  ADDED today prone lying over a pillow under his hips and then under his arms, and gluteal contraction to work on finding a neutral spine position x 5 second hold x 5-10 repetitions x 2 per day.    Maintaining to neutral positioning lumbar spine and pelvis with alternate leg hip flexion knee flexion, hip flexion and extension; alternate leg with rotation.  Progressive the session provided by the physical therapist assistant for lower extremity flexibility      Tissue work seated with a ball and or a stick. -Has not yet implemented.  Work on forward bending from hips and not from mid back.  Supine hamstring and calf stretching  combination. -Still very tight.  Supine neutral position with head supported by pillow, knees supported by a block and activating multifidi and transverse abdominals.  Seated neutral spine position as well.    Patient's response to therapy: Walter BroodBrent is reporting that moving around in his day is getting better, sitting is still painful, doing work activities are still painful.  On his own he discovered lying on his stomach is much less painful than lying on his back.  He does this a couple of times a day and it is very relieving of pain.    Education and instruction: Verbal instructions and demonstrations provided for the above exercises. Worked on his mechanics with sitting, prone lying, and supine and getting up and down from sitting and lying.    Progress towards goals:    IMPAIRMENTS FUNCTIONAL DEFICITS LONG TERM GOALS   1.  Tissue extensibility dysfunction hamstrings, calf muscles, posterior myofascial line lower extremity.    1.  The patient has difficulty getting in and out of bed secondary to increased low back pain and repetitive strain on the low back area    1.  The patient will begin able to get in and out of bed without increase in back pain by November 20, 2014. -Still not yet meeting without pain, but moving with improved form. 08-11-2014.  2.  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 the patient is unable to lift and carry heavy loads  2.  He will be able to lift up to 50 pounds without any increase in low back pain by November 20, 2014. - Not yet meeting. 08-11-2014.   3.  Joint mobility dysfunction the lumbar spine with restriction going into flexion as well as extension patterns.   3.  The patient has difficulty with forward reaching activities at work  3. 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 -Not yet meeting and currently has help  to do this at work. 08-11-2014.         Any changes in impairment:  He is reporting improvement in moving around when upright.  Still having the most difficulty sitting for any length of time beyond one minute and lying on his back at first is very uncomfortable.    Functional impairment remaining requiring continued skilled services: Walter Rice is working on returning back to work activities with less back pain and doing normal activities of daily living with less pain.    Any changes in the plan of care: Continue to progress core stabilization exercises with the back supported to initiate activity of the deep stabilizers including the lumbar spine, pelvis, thoracic spine.  Next progress lower extremity flexibility exercises.    This patient will be seen for 12  therapy sessions within 12 weeks.  The patient will perform the instructed home program 2 times per day.  The therapy sessions may include therapeutic exercise, manual therapy and self-care home management training  This patient will recheck with the referring provider in 6 weeks.     Arline Asp, PTA

## 2014-08-18 ENCOUNTER — Encounter (HOSPITAL_BASED_OUTPATIENT_CLINIC_OR_DEPARTMENT_OTHER): Payer: No Typology Code available for payment source | Admitting: Rehabilitative and Restorative Service Providers"

## 2014-08-18 NOTE — Progress Notes (Signed)
Walter Rice did not cancel and was not present for a scheduled appointment today.

## 2014-08-25 ENCOUNTER — Ambulatory Visit (HOSPITAL_BASED_OUTPATIENT_CLINIC_OR_DEPARTMENT_OTHER): Payer: No Typology Code available for payment source | Admitting: Rehabilitative and Restorative Service Providers"

## 2014-08-25 DIAGNOSIS — M545 Low back pain, unspecified: Secondary | ICD-10-CM

## 2014-08-25 DIAGNOSIS — M6281 Muscle weakness (generalized): Secondary | ICD-10-CM

## 2014-08-25 DIAGNOSIS — M629 Disorder of muscle, unspecified: Secondary | ICD-10-CM

## 2014-08-25 NOTE — Progress Notes (Signed)
Physical Therapy Progress Note.  Time in: 2:30 P.M.                    Duration: 30 minutes.  Today's date: 08/25/2014.  Onset date: July 07, 2014.  Referring Provider: Nelson Chimeseborah Lynn Rice    Referring provider NPI: [id number]  [facility]    Treatment/Therapy diagnosis: Midline Low back pain without lower extremity radiation , trunk weakness,  tightness of both hamstrings   The following patient identifiers were confirmed today: name and date of birth.    Pain level:  The patient is reporting most times a 4-5/10 level of pain, on a 0-10 Numeric Pain Distress Scale. His pain level has not gone up above 10/10 since last session. 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    Interventions/Treatment provided: Therapeutic exercise for 30 minutes.    Therapeutic exercise review consisted of;    Supine with the head and upper thoracic supported and elevated, legs elevated on a platform the hip flexors; performing posterior pelvic tilt, isometric contraction of the transverse abdominal muscles and the multifidus muscles.      Prone lying with pillows under his arms.     Prone over a pillow under his hips and then under his arms, and gluteal contraction to work on finding a neutral spine position x 5 second hold x 5-10 repetitions x 2 per day.    ADDED prone alternating leg lifting and alternating arm lifting with a neutral spine with a gluteal contraction x 2 second hold x 5-15 repetitions x 1 x a day x 4 days a week.    ADDED supine knee to chest hold x 0 second holding x 5 repetitions towards him and extending arms outward and then a hold x as many breaths as tolerated.    Tissue work seated with a ball and or a stick. -Has not yet implemented.  Work on forward bending from hips and not from mid back.  Supine hamstring and calf stretching combination. -Still very  tight.  Supine neutral position with head supported by pillow, knees supported by a block and activating multifidi and transverse abdominals.  Seated neutral spine position as well.    Patient's response to therapy: Walter Rice is reporting that he is still having some difficulty standing for extended times on his feet at work.  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  He reports his pain at that pain becomes so intense that his legs feel like they will not hold him up.  His manageer will not make accommodations for him.    Education and instruction: Gave him a handout today on the above new exercises.    Progress towards goals:    IMPAIRMENTS FUNCTIONAL DEFICITS LONG TERM GOALS   1.  Tissue extensibility dysfunction hamstrings, calf muscles, posterior myofascial line lower extremity.    1.  The patient has difficulty getting in and out of bed secondary to increased low back pain and repetitive strain on the low back area    1.  The patient will begin able to get in and out of bed without increase in back pain by November 20, 2014. -Slowly getting less pain and has better technique. 08-25-2014.      2.  Selective motor  control dysfunction of the lumbar spine and pelvis, with poor activation of the stabilizer muscles  2. the patient is unable to lift and carry heavy loads  2.  He will be able to lift up to 50 pounds without any increase in low back pain by November 20, 2014. - Not yet meeting. 08-25-2014.   3.  Joint mobility dysfunction the lumbar spine with restriction going into flexion as well as extension patterns.   3.  The patient has difficulty with forward reaching activities at work  3. 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 -He might be able to sit at times with the blaster which can help but it is still causing pain. 08-25-2014.       Any changes in impairment:  He is  reporting improvement in moving around when upright.  Now having the most difficulty standing for long periods of time.    Functional impairment remaining requiring continued skilled services: Walter Rice is working on returning back to work activities like standing, sitting with less back pain and doing normal activities of daily living with less pain.    Any changes in the plan of care: Continue to progress core stabilization exercises with the back supported to initiate activity of the deep stabilizers including the lumbar spine, pelvis, thoracic spine. Re-check with primary P.T., on next session.    This patient will be seen for 12  therapy sessions within 12 weeks.  The patient will perform the instructed home program 2 times per day.  The therapy sessions may include therapeutic exercise, manual therapy and self-care home management training  This patient will recheck with the referring provider in 6 weeks.     Walter Rice, PTA

## 2014-09-05 ENCOUNTER — Ambulatory Visit: Payer: No Typology Code available for payment source | Attending: Internal Medicine | Admitting: Physical Therapist

## 2014-09-05 DIAGNOSIS — M6281 Muscle weakness (generalized): Secondary | ICD-10-CM | POA: Insufficient documentation

## 2014-09-05 DIAGNOSIS — M545 Low back pain, unspecified: Secondary | ICD-10-CM

## 2014-09-05 NOTE — Progress Notes (Signed)
Physical Therapy Progress Note.  Time in:  10:30 AM.                    Duration: 30 minutes.  Today's date: 08/25/2014.  Onset date: July 07, 2014.  Referring Provider: Nelson Chimeseborah Lynn Greenberg    Referring provider NPI: [id number]  [facility]     Treatment/Therapy diagnosis: Midline Low back pain without lower extremity radiation , trunk weakness,  tightness of both hamstrings   The following patient identifiers were confirmed today: name and date of birth.    Pain level:  The patient is reporting most times a 4-5/10 level of pain, on a 0-10 Numeric Pain Distress Scale. His pain level has not gone up above 10/10 since last session.  He reports that he continues to perform his home exercises consistently.  He reports that he continues to have difficulty with prolonged standing activities secondary to increase in low back pain issues.    Interventions/Treatment provided: Therapeutic exercise for 30 minutes.    Therapeutic exercise review consisted of;  On the reformer, the patient was provided with instructions in how to activate the pelvic floor muscles as well as the medial line of the lower extremities, the patient performed double leg press, midrange leg press positioning with alternate unweighting of the opposite leg, calf stretches, bilateral and unilateral placing the forefoot on the foot bar.  Alternating weight shift from one leg to the other, stretching the weightbearing leg, single leg knee to chest with the opposite leg in a high half toe position.      I showed the patient how to perform Stretches in a supine position using a belt.  Education and instruction: Gave him a handout today on the above new exercises.    Progress towards goals:    IMPAIRMENTS FUNCTIONAL DEFICITS LONG TERM GOALS   1.  Tissue extensibility dysfunction hamstrings, calf muscles, posterior myofascial line lower extremity.    1.  The patient has difficulty getting in and out of bed secondary to increased  low back pain and repetitive strain on the low back area    1.  The patient will begin able to get in and out of bed without increase in back pain by November 20, 2014. -Slowly getting less pain and has better technique. 08-25-2014.      2.  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 the patient is unable to lift and carry heavy loads  2.  He will be able to lift up to 50 pounds without any increase in low back pain by November 20, 2014. - Not yet meeting. 08-25-2014.   3.  Joint mobility dysfunction the lumbar spine with restriction going into flexion as well as extension patterns.   3.  The patient has difficulty with forward reaching activities at work  3. 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 -He might be able to sit at times with the blaster which can help but it is still causing pain. 08-25-2014.       Any changes in impairment:  He is reporting improvement in moving around when upright.  Now having the most difficulty standing for long periods of time.    Functional impairment remaining requiring continued skilled services: [name] is working on returning back to work activities  like standing, sitting with less back pain and doing normal activities of daily living with less pain.    Any changes in the plan of care: Continue to progress core stabilization exercises with the back supported to initiate activity of the deep stabilizers including the lumbar spine, pelvis, thoracic spine.  Had Stretching exercises to the patient's home program.  Continue to progress the routines to include lower extremity flexibility of the calf muscles and ankle joint mobility    Gus Puma, PT      This note was generated in part using voice recognition software. Although every effort is made to edit the content, transcription errors may  occur.

## 2014-09-08 ENCOUNTER — Telehealth (HOSPITAL_BASED_OUTPATIENT_CLINIC_OR_DEPARTMENT_OTHER): Payer: Self-pay

## 2014-09-08 ENCOUNTER — Other Ambulatory Visit (HOSPITAL_BASED_OUTPATIENT_CLINIC_OR_DEPARTMENT_OTHER): Payer: Self-pay

## 2014-09-08 ENCOUNTER — Ambulatory Visit
Admit: 2014-09-08 | Discharge: 2014-09-08 | Disposition: A | Discharge status: Home / Self Care | Payer: No Typology Code available for payment source | Attending: Pharmacist | Admitting: Pharmacist

## 2014-09-08 ENCOUNTER — Encounter (HOSPITAL_BASED_OUTPATIENT_CLINIC_OR_DEPARTMENT_OTHER): Payer: No Typology Code available for payment source | Admitting: Physical Therapist

## 2014-09-08 DIAGNOSIS — Z954 Presence of other heart-valve replacement: Secondary | ICD-10-CM | POA: Insufficient documentation

## 2014-09-08 DIAGNOSIS — Z7901 Long term (current) use of anticoagulants: Secondary | ICD-10-CM | POA: Insufficient documentation

## 2014-09-08 DIAGNOSIS — Z8673 Personal history of transient ischemic attack (TIA), and cerebral infarction without residual deficits: Secondary | ICD-10-CM | POA: Insufficient documentation

## 2014-09-08 DIAGNOSIS — Z952 Presence of prosthetic heart valve: Secondary | ICD-10-CM

## 2014-09-08 LAB — PROTHROMBIN TIME
Prothrombin INR: 3.2 — ABNORMAL HIGH (ref 0.8–1.3)
Prothrombin Time Patient: 31.2 s — ABNORMAL HIGH (ref 10.7–15.6)

## 2014-09-11 ENCOUNTER — Encounter (HOSPITAL_BASED_OUTPATIENT_CLINIC_OR_DEPARTMENT_OTHER): Payer: No Typology Code available for payment source | Admitting: Rehabilitative and Restorative Service Providers"

## 2014-09-11 ENCOUNTER — Ambulatory Visit: Payer: No Typology Code available for payment source | Admitting: Pharmacist

## 2014-09-11 DIAGNOSIS — Z7901 Long term (current) use of anticoagulants: Secondary | ICD-10-CM

## 2014-09-11 DIAGNOSIS — Z952 Presence of prosthetic heart valve: Secondary | ICD-10-CM

## 2014-09-11 NOTE — Telephone Encounter (Signed)
ANTICOAGULATION TREATMENT PLAN    Indication: MVR; recurrent CVA  Goal INR: 2.5-3.5  Duration of Therapy: chronic    Hemorrhagic Risk Score: 1  Warfarin Tablet Size: 5mg     Relevant Historic Information: CVA 2008; 2005; 2006    Referring Provider: Nadene Rubins    SUBJECTIVE:   [facility] last evaluated Walter Rice on 08/04/14 when his INR was 3.2.  Follow-up was scheduled for 5 weeks later    I spoke with Walter Rice over the phone.  Patient denies any unusual bleeding or bruising since last [facility] encounter.  Pt denies any signs or symptoms of CVA.  No recent illnesses.    He eats vegetables a few times weekly.  No change in activity level.  No change in alcohol consumption        OBJECTIVE:   Present anticoagulant dose: Warfarin 5mg  MON and 7.5mg  others or 50mg /week  No dosing errors.    Relevant medication changes: none      LABS:   INR      3.2   09/08/2014  INR      3.2   08/04/2014  INR      3.7   07/21/2014  INR      3.0   06/16/2014  INR      3.5   06/05/2014  INR      4.5   01/22/2012    ASSESSMENT:   INR within target range of 2.5 to 3.5 in pt who appears to be tolerating warfarin therapy without significant problems.      PLAN:   1) CONTINUE warfarin 5mg  MON and 7.5mg  others    2) Return in 5 weeks (on 10/13/2014).     3) Plan emailed to pt's sister Walter Rice after agreement from patient.    4) Patient is in agreement and verbally expressed understanding of the plan.    [name], PharmD

## 2014-09-11 NOTE — Progress Notes (Signed)
Walter Rice did not cancel and was not present for a scheduled appointment today.

## 2014-09-19 ENCOUNTER — Encounter (HOSPITAL_BASED_OUTPATIENT_CLINIC_OR_DEPARTMENT_OTHER): Payer: No Typology Code available for payment source | Admitting: Rehabilitative and Restorative Service Providers"

## 2014-09-19 NOTE — Progress Notes (Signed)
Walter Rice did not cancel and was not present for a scheduled appointment today.

## 2014-10-13 ENCOUNTER — Ambulatory Visit
Admit: 2014-10-13 | Discharge: 2014-10-13 | Disposition: A | Discharge status: Home / Self Care | Payer: No Typology Code available for payment source | Attending: Pharmacist | Admitting: Pharmacist

## 2014-10-13 ENCOUNTER — Other Ambulatory Visit (HOSPITAL_BASED_OUTPATIENT_CLINIC_OR_DEPARTMENT_OTHER): Payer: No Typology Code available for payment source

## 2014-10-13 ENCOUNTER — Telehealth (HOSPITAL_BASED_OUTPATIENT_CLINIC_OR_DEPARTMENT_OTHER): Payer: Self-pay

## 2014-10-13 DIAGNOSIS — Z8673 Personal history of transient ischemic attack (TIA), and cerebral infarction without residual deficits: Secondary | ICD-10-CM

## 2014-10-13 DIAGNOSIS — Z7901 Long term (current) use of anticoagulants: Secondary | ICD-10-CM | POA: Insufficient documentation

## 2014-10-13 DIAGNOSIS — Z952 Presence of prosthetic heart valve: Secondary | ICD-10-CM

## 2014-10-13 LAB — PROTHROMBIN TIME
Prothrombin INR: 3.1 — ABNORMAL HIGH (ref 0.8–1.3)
Prothrombin Time Patient: 30.2 s — ABNORMAL HIGH (ref 10.7–15.6)

## 2014-10-16 ENCOUNTER — Ambulatory Visit: Payer: No Typology Code available for payment source | Admitting: Pharmacist

## 2014-10-16 DIAGNOSIS — Z7901 Long term (current) use of anticoagulants: Secondary | ICD-10-CM

## 2014-10-16 DIAGNOSIS — Z952 Presence of prosthetic heart valve: Secondary | ICD-10-CM

## 2014-10-16 NOTE — Telephone Encounter (Signed)
ANTICOAGULATION TREATMENT PLAN    Indication: MVR; recurrent CVA  Goal INR: 2.5-3.5  Duration of Therapy: chronic    Hemorrhagic Risk Score: 1  Warfarin Tablet Size: 5mg     Relevant Historic Information: CVA 2008; 2005; 2006    Referring Provider: Nadene Rubins    SUBJECTIVE:   Walter Rice was last evaluated by the anticoagulation clinic on 09/11/14 and instructed to continue warfarin 5mg  Mon and 7.5mg  all other days.    Today, pt reports no unusual bruising/bleeding. Diet consistent. Pt took all prescribed doses. No acute illness or signs/sx of stroke noted by patient.  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  Green vegetable intake stable (has some each week).  Alcohol intake has been a little lower.  He has maybe one or two on [facility] game days.        Present dose: Warfarin 5mg  Mon and 7.5mg  all other days    OBJECTIVE:     Relevant medication changes: None.    LABS:   INR      3.1   10/13/2014  INR      3.2   09/08/2014  INR      3.2   08/04/2014  INR      4.5   01/22/2012      ASSESSMENT:   INR therapeutic in otherwise stable patient without any warfarin related concerns or events.    As no reported changes in diet, lifestyle, or medications since last [facility] assessment, it is reasonable to continue current maintenance dose and continue frequency of INR monitoring.    PLAN:   1. Continue warfarin 5mg  Mon and 7.5mg  all other days.   2. Return in 5 weeks (on 11/17/2014). INR @ [facility].  Patient verbally expressed understanding of the above plan.        Terence Lux, PHARMD

## 2014-11-15 ENCOUNTER — Ambulatory Visit: Payer: No Typology Code available for payment source | Attending: Cardiovascular Disease | Admitting: Pharmacist

## 2014-11-15 ENCOUNTER — Encounter (HOSPITAL_BASED_OUTPATIENT_CLINIC_OR_DEPARTMENT_OTHER): Payer: Self-pay

## 2014-11-15 ENCOUNTER — Other Ambulatory Visit (HOSPITAL_BASED_OUTPATIENT_CLINIC_OR_DEPARTMENT_OTHER): Payer: Self-pay

## 2014-11-15 DIAGNOSIS — Z952 Presence of prosthetic heart valve: Secondary | ICD-10-CM | POA: Insufficient documentation

## 2014-11-15 DIAGNOSIS — Z8673 Personal history of transient ischemic attack (TIA), and cerebral infarction without residual deficits: Secondary | ICD-10-CM

## 2014-11-15 DIAGNOSIS — Z7901 Long term (current) use of anticoagulants: Secondary | ICD-10-CM

## 2014-11-15 LAB — PROTHROMBIN TIME
Prothrombin INR: 4 — ABNORMAL HIGH (ref 0.8–1.3)
Prothrombin Time Patient: 36.6 s — ABNORMAL HIGH (ref 10.7–15.6)

## 2014-11-15 MED ORDER — WARFARIN SODIUM 5 MG OR TABS
ORAL_TABLET | ORAL | Status: DC
Start: 2014-11-15 — End: 2014-12-25

## 2014-11-15 NOTE — Progress Notes (Signed)
Jaetyn walked into clinic today with forms to fill out for his work.  I looked over the 8 page document.  It was a generic form from his employer regarding wearing a mask.  Almost every page was a health history that the patient fills out himself.  There were several pages towards the back that could be filled out by a provider, but the questions were all written as "do you have" (like the patient was filling out, not the physician).  There was also no place on any page for a provider for sign, date and fill out address and phone number, etc.    I spoke with Daquain about the forms.  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  He stated he would do this.  He asked me for a letter for work excuse for bringing in the forms.  I stated that his work could call me if they had any questions or concerns about the 5 minutes he was here in clinic showing me the forms.    All questions answered and he was satisfied to go back to work and fill out the forms he was given.  He has my direct callback for any further questions or needs.    Karolee Stamps, RN

## 2014-11-15 NOTE — Patient Instructions (Signed)
WARFARIN DOSE INSTRUCTIONS    This is the current warfarin dose for Walter Rice as of 11/15/2014:     PLEASE TAKE 5MG  (INSTEAD OF7.5MG ) ON Wednesday 2/10 AND THEN RESUME USUAL DOSE BELOW     Warfarin 5mg  tablets     Sunday Monday Tuesday Wednesday Thursday Friday Saturday    Dose (mg) 7.5 mg 5 mg 7.5 mg 7.5 mg 7.5 mg 7.5 mg 7.5 mg   Number of tablets  1 & 1/2 1 1  & 1/2 1 & 1/2 1 & 1/2 1 & 1/2 1 & 1/2     Call [phone] if there are any questions or concerns about your medication or blood thinner.    INR      4.0   11/15/2014  INR      3.1   10/13/2014  INR      3.2   09/08/2014      Return in 3 weeks (on 12/06/2014). for your next blood draw.      Call [phone] for any questions or concerns about your blood thinner, upcoming procedures and surgeries, or when starting a new prescription medication or over-the-counter (OTC) therapy.    You may call after usual clinic hours (Mon-Fri 8:30AM - 5PM) for urgent issues (use option 2 to speak with the hospital operator to 'page' the on-call anticoagulation pharmacist).    [facility]  [address]  [address]  [place], [place] [address]  [email or web address]  [email or web address]

## 2014-11-15 NOTE — Addendum Note (Signed)
Addended by: Encarnacion Chu A on: 11/15/2014 02:15 PM     Modules accepted: Orders

## 2014-11-15 NOTE — Progress Notes (Signed)
ANTICOAGULATION TREATMENT PLAN    Indication: MVR; recurrent CVA  Goal INR: 2.5-3.5  Duration of Therapy: chronic    Hemorrhagic Risk Score: 1  Warfarin Tablet Size: 5mg     Relevant Historic Information: CVA 2008; 2005; 2006    Referring Provider: Nadene Rubins      SUBJECTIVE:   Walter Rice was last evaluated by [facility] on 10/07/2014 when INR was 3.1. Patient was given instructions to continue current warfarin dose and follow up with ACC in 5 weeks.     Today, patient denies any signs/symptoms of bleeding or unusual bruising despite overanticoagulation. Denies signs/symptoms of TIA/CVA. Patient reports he has been sneezing a lot. He has no other symptoms. A nurse in his family told him he likely has allergies. Diet stable with regards to vitamin k intake. Patient estimates he has approximately 2 servings of leafy greens per week. He had 1 alcoholic drink on 11/13/2014. Denies changes in Rx/OTCs/herbal medications since previous [facility] visit.     OBJECTIVE:     Current  dose:   Warfarin 5mg  on  Mondays and 7.5mg  on all other days of the week. Pt reports he took 7.5mg  (instead of 5mg ) on 11/13/2014 in error.       Relevant medication changes:   none      LABS:   INR      4.0   11/15/2014  INR      3.1   10/13/2014  INR      3.2   09/08/2014  INR      3.2   08/04/2014  INR      3.7   07/21/2014      ASSESSMENT:   INR above goal of 2.5-3.5 due to patient taking more warfarin in error this week. Patient has been otherwise stable on current warfarin dose for > 1 year. No bleeding concerns.     PLAN:   1. Warfarin 5mg  (instead of 7.5mg ) tonight, then resume warfarin at usual dose of 5mg  Mondays and 7.5mg  on all other days of the week   2. Return in 3 weeks (on 12/06/2014).   3. Pt acknowledged understanding of this plan    Encarnacion Chu, [facility]

## 2014-11-17 ENCOUNTER — Telehealth (HOSPITAL_BASED_OUTPATIENT_CLINIC_OR_DEPARTMENT_OTHER): Payer: Self-pay

## 2014-12-06 ENCOUNTER — Encounter (HOSPITAL_BASED_OUTPATIENT_CLINIC_OR_DEPARTMENT_OTHER): Payer: Self-pay

## 2014-12-08 ENCOUNTER — Other Ambulatory Visit (HOSPITAL_BASED_OUTPATIENT_CLINIC_OR_DEPARTMENT_OTHER): Payer: Self-pay

## 2014-12-08 ENCOUNTER — Ambulatory Visit: Payer: No Typology Code available for payment source | Attending: Pharmacist | Admitting: Pharmacotherapy

## 2014-12-08 DIAGNOSIS — Z8673 Personal history of transient ischemic attack (TIA), and cerebral infarction without residual deficits: Secondary | ICD-10-CM | POA: Insufficient documentation

## 2014-12-08 DIAGNOSIS — Z952 Presence of prosthetic heart valve: Secondary | ICD-10-CM

## 2014-12-08 DIAGNOSIS — Z7901 Long term (current) use of anticoagulants: Secondary | ICD-10-CM

## 2014-12-08 LAB — PROTHROMBIN TIME
Prothrombin INR: 4.1 — ABNORMAL HIGH (ref 0.8–1.3)
Prothrombin Time Patient: 37.8 s — ABNORMAL HIGH (ref 10.7–15.6)

## 2014-12-08 NOTE — Telephone Encounter (Signed)
ANTICOAGULATION TREATMENT PLAN    Indication: MVR; recurrent CVA  Goal INR: 2.5-3.5  Duration of Therapy: chronic    Hemorrhagic Risk Score: 1  Warfarin Tablet Size: 5mg     Relevant Historic Information: CVA 2008; 2005; 2006    Referring Provider: Nadene Rubins      SUBJECTIVE:   Walter Rice was last evaluated by [facility] on 11/15/14.  INR was 4.0 suspected as being related to a dosing error.  He was instructed to make a 1x adjustment, then resume his usual dose of 5mg  Mon and 7.5mg  all other days    Today, he reports no bleeding, no unusual bruising, and no signs/sx of TIA or stroke. Health and habits, including dietary vitamin K intake, are unchanged since last [facility] visit.   He denies any change in parameters that influence warfarin dosing requirement    OBJECTIVE:     Current  dose:   Warfarin 5mg  Mon and 7.5mg      Relevant medication changes:   none      LABS:   INR      4.1   12/08/2014  INR      4.0   11/15/2014  INR      3.1   10/13/2014  INR      3.2   09/08/2014  INR      3.2   08/04/2014  INR      4.5   01/22/2012     ASSESSMENT:   Elevated INR for the 2nd reading in a row, with no complications.  Will alter maintenance dose at this time    PLAN:   1. Reduce warfarin to 5mg  MF and 7.5mg  all other days    2. Return in 7 days (on 12/15/2014).   3. Pt acknowledged understanding of this plan        Melinda Crutch, [facility]

## 2014-12-12 ENCOUNTER — Emergency Department
Admission: EM | Admit: 2014-12-12 | Discharge: 2014-12-12 | Disposition: A | Discharge status: Home / Self Care | Payer: No Typology Code available for payment source | Attending: Emergency Medical Services | Admitting: Emergency Medical Services

## 2014-12-12 DIAGNOSIS — T45515A Adverse effect of anticoagulants, initial encounter: Secondary | ICD-10-CM

## 2014-12-12 DIAGNOSIS — Z7901 Long term (current) use of anticoagulants: Secondary | ICD-10-CM

## 2014-12-12 DIAGNOSIS — R58 Hemorrhage, not elsewhere classified: Secondary | ICD-10-CM

## 2014-12-12 LAB — CBC (HEMOGRAM)
Hematocrit: 42 % (ref 38–50)
Hemoglobin: 14.4 g/dL (ref 13.0–18.0)
MCH: 32.1 pg (ref 27.3–33.6)
MCHC: 34.3 g/dL (ref 32.2–36.5)
MCV: 94 fL (ref 81–98)
Platelet Count: 218 10*3/uL (ref 150–400)
RBC: 4.49 mil/uL (ref 4.40–5.60)
RDW-CV: 13.2 % (ref 11.6–14.4)
WBC: 7.13 10*3/uL (ref 4.3–10.0)

## 2014-12-12 LAB — PROTHROMBIN & PTT
Partial Thromboplastin Time: 50 s — ABNORMAL HIGH (ref 22–35)
Prothrombin INR: 3.5 — ABNORMAL HIGH (ref 0.8–1.3)
Prothrombin Time Patient: 33.8 s — ABNORMAL HIGH (ref 10.7–15.6)

## 2014-12-13 ENCOUNTER — Encounter (HOSPITAL_BASED_OUTPATIENT_CLINIC_OR_DEPARTMENT_OTHER): Payer: Self-pay | Admitting: Pharmacotherapy

## 2014-12-13 DIAGNOSIS — Z952 Presence of prosthetic heart valve: Secondary | ICD-10-CM

## 2014-12-13 DIAGNOSIS — Z7901 Long term (current) use of anticoagulants: Secondary | ICD-10-CM

## 2014-12-13 NOTE — Progress Notes (Signed)
ED VISIT  12/11/24  - ANTICOAGULATION SUMMARY    ANTICOAGULATION TREATMENT PLAN    Indication: MVR; recurrent CVA  Goal INR: 2.5-3.5  Duration of Therapy: chronic    Hemorrhagic Risk Score: 1  Warfarin Tablet Size: 5mg     Relevant Historic Information: CVA 2008; 2005; 2006    Referring Provider: Nadene Rubins    INTERVAL HISTORY    Walter Rice was last evaluated by [facility] on 12/08/14 .  His  INR was 4.1 and he was instructed to reduce warfarin from 50mg /wk to 47.5mg /wk (5mg  MF and 7.5mg  all other days)    On 12/12/14 he was seen in the [facility] ED for gum bleeding after brushing his teeth earlier in the day.  INR on presentation was 3.5 and HCT was 42%.  Bleeding resolved without intervention.    PLAN:  1) continue warfarin 5mg  MF and 7.5mg  all other days  2) repeat INR on 12/15/14 as previously arranged  3) Walter Rice will need counseling on self-management of gum bleeding, and may need assistance with recommendations for oral hygiene.     Melinda Crutch, PHARMD

## 2014-12-15 ENCOUNTER — Telehealth (HOSPITAL_BASED_OUTPATIENT_CLINIC_OR_DEPARTMENT_OTHER): Payer: Self-pay

## 2014-12-22 ENCOUNTER — Telehealth (HOSPITAL_BASED_OUTPATIENT_CLINIC_OR_DEPARTMENT_OTHER): Payer: Self-pay

## 2014-12-22 ENCOUNTER — Other Ambulatory Visit (HOSPITAL_BASED_OUTPATIENT_CLINIC_OR_DEPARTMENT_OTHER): Payer: No Typology Code available for payment source

## 2014-12-22 ENCOUNTER — Ambulatory Visit
Admit: 2014-12-22 | Discharge: 2014-12-22 | Disposition: A | Discharge status: Home / Self Care | Payer: No Typology Code available for payment source | Attending: Pharmacist | Admitting: Pharmacist

## 2014-12-22 DIAGNOSIS — Z952 Presence of prosthetic heart valve: Secondary | ICD-10-CM

## 2014-12-22 DIAGNOSIS — Z8673 Personal history of transient ischemic attack (TIA), and cerebral infarction without residual deficits: Secondary | ICD-10-CM | POA: Insufficient documentation

## 2014-12-22 DIAGNOSIS — Z7901 Long term (current) use of anticoagulants: Secondary | ICD-10-CM

## 2014-12-22 LAB — PROTHROMBIN TIME
Prothrombin INR: 3.7 — ABNORMAL HIGH (ref 0.8–1.3)
Prothrombin Time Patient: 35.4 s — ABNORMAL HIGH (ref 10.7–15.6)

## 2014-12-25 ENCOUNTER — Ambulatory Visit: Payer: No Typology Code available for payment source | Admitting: Pharmacist

## 2014-12-25 DIAGNOSIS — Z7901 Long term (current) use of anticoagulants: Secondary | ICD-10-CM

## 2014-12-25 DIAGNOSIS — Z952 Presence of prosthetic heart valve: Secondary | ICD-10-CM

## 2014-12-25 NOTE — Telephone Encounter (Signed)
ANTICOAGULATION TREATMENT PLAN    Indication: MVR; recurrent CVA  Goal INR: 2.5-3.5  Duration of Therapy: chronic    Hemorrhagic Risk Score: 1  Warfarin Tablet Size: 5mg     Relevant Historic Information: CVA 2008; 2005; 2006    Referring Provider: Nadene Rubins    INTERVAL HISTORY    Walter Rice was last evaluated by [facility] on 12/08/14 . His INR was 4.1 and he was instructed to reduce warfarin from 50mg /wk to 47.5mg /wk (5mg  MF and 7.5mg  all other days)    On 12/12/14 he was seen in the [facility] ED for gum bleeding after brushing his teeth earlier in the day. INR on presentation was 3.5 and HCT was 42%. Bleeding resolved without intervention.      SUBJECTIVE:     Patient denies any gum bleeding since his ER visit. He denies any signs/symptoms of bleeding or unusual bruising. Denies melena or hematuria. Denies signs/symptoms of TIA/CVA. No acute illnesses or overall changes in health. Diet stable and remains minimal in leafy greens. No change in activity level. Denies changes in Rx/OTCs/herbal medications since previous [facility] visit. No ETOH the week prior to blood draw.     OBJECTIVE:     Current  dose:   Warfarin 5mg  MF, 7.5mg  on all other days of the week (47.5mg /wk). Dose decreased from 50mg /wk on 12/08/2014 due to INR of 4.1. Pt denies omissions or errors in warfarin dosing.      Relevant medication changes:   none      LABS:   INR      3.7   12/22/2014  INR      3.5   12/12/2014  INR      4.1   12/08/2014  INR      4.0   11/15/2014  INR      3.1   10/13/2014      ASSESSMENT:   INR remains elevated despite reduction in warfarin dose. Unclear etiology for recent increased warfarin sensitivity.     PLAN:   1. Decrease warfarin dose to 5mg  MWF, 7.5mg  on all other days of the week (45mg /wk)  2. Return in 11 days (on 01/05/2015).   3. Pt acknowledged understanding of this plan    Encarnacion Chu, [facility]

## 2015-01-05 ENCOUNTER — Other Ambulatory Visit (HOSPITAL_BASED_OUTPATIENT_CLINIC_OR_DEPARTMENT_OTHER): Payer: Self-pay

## 2015-01-05 ENCOUNTER — Ambulatory Visit: Payer: No Typology Code available for payment source | Attending: Pharmacist | Admitting: Pharmacist

## 2015-01-05 DIAGNOSIS — Z952 Presence of prosthetic heart valve: Secondary | ICD-10-CM

## 2015-01-05 DIAGNOSIS — Z8673 Personal history of transient ischemic attack (TIA), and cerebral infarction without residual deficits: Secondary | ICD-10-CM | POA: Insufficient documentation

## 2015-01-05 DIAGNOSIS — Z7901 Long term (current) use of anticoagulants: Secondary | ICD-10-CM | POA: Insufficient documentation

## 2015-01-05 LAB — PROTHROMBIN TIME
Prothrombin INR: 2.9 — ABNORMAL HIGH (ref 0.8–1.3)
Prothrombin Time Patient: 29.5 s — ABNORMAL HIGH (ref 10.7–15.6)

## 2015-01-05 NOTE — Telephone Encounter (Signed)
ANTICOAGULATION TREATMENT PLAN    Indication: MVR; recurrent CVA  Goal INR: 2.5-3.5  Duration of Therapy: chronic    Hemorrhagic Risk Score: 1  Warfarin Tablet Size: 5mg     Relevant Historic Information: CVA 2008; 2005; 2006    Referring Provider: Nadene Rubins    SUBJECTIVE:   Walter Rice was last evaluated by [facility] on 12/25/14.  His INR was 3.7 and he was instructed to decrease his dose of warfarin and return for follow-up in 11 days' time    Walter Rice denies any further gum bleeding.  Patient denies any unusual bleeding or bruising since last [facility] encounter.  Pt denies any signs or symptoms of CVA.  No recent illnesses.  He rarely eats green vegetables (unchanged).  Activity level stable      OBJECTIVE:   Present anticoagulant dose: Warfarin 5mg  MWF and 7.5mg  others since 12/25/14  Dose decreased from 47.5mg /week to 45mg /week due to INR of 3.7    Relevant medication changes: none      LABS:   INR      2.9   01/05/2015  INR      3.7   12/22/2014  INR      3.5   12/12/2014  INR      4.1   12/08/2014  INR      4.0   11/15/2014        ASSESSMENT:   INR within target range of 2 to 3 in pt who appears to be tolerating warfarin therapy without significant problems.      PLAN:   1) For now, CONTINUE warfarin 5mg  MWF and 7.5mg  others    2) Return in 2 weeks (on 01/19/2015).  If INR does not appear to be trending down, can extend the amount of time between visits.    3) Patient is in agreement and verbally expressed understanding of the plan.    [name], PHARMD

## 2015-01-11 ENCOUNTER — Encounter (HOSPITAL_BASED_OUTPATIENT_CLINIC_OR_DEPARTMENT_OTHER): Payer: Self-pay | Admitting: Pediatrics

## 2015-01-11 ENCOUNTER — Ambulatory Visit: Payer: No Typology Code available for payment source | Attending: Pediatrics | Admitting: Pediatrics

## 2015-01-11 VITALS — BP 106/67 | HR 65 | Temp 98.4°F | Ht 72.0 in | Wt 167.0 lb

## 2015-01-11 DIAGNOSIS — I495 Sick sinus syndrome: Secondary | ICD-10-CM

## 2015-01-11 NOTE — Progress Notes (Addendum)
Primary Care Physician: Nelson Chimes [facility])     Reason for Visit   Follow-up pacemaker   Sick sinus syndrome   History of palpitations      Patient Active Problem List    Diagnosis Date Noted   . Low back pain [M54.5] 08/04/2014   . Weakness of trunk musculature [M62.81] 08/04/2014   . Hamstring tightness of both lower extremities [M62.9] 08/04/2014   . Midline low back pain without sciatica [M54.5] 08/04/2014   . 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 A) Status post repair with ventricular septal defect closure in February 1989. 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 Jude prosthetic valve in 1993.  TX FROM [place]     . 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       . 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  TX FROM [place]     . Headache [R51]      TX FROM [place]     . Mitral valve replaced [Z95.2] 04/13/2013   . Chronic anticoagulation [Z79.01] 02/07/2013     [facility] ENROLLED     . CVA (cerebral vascular accident) [I63.9] 09/10/2011   . 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  He continues to be atrially paced 100% of the time and has an underlying escape rhythm in the 40s to 50s.     I last saw him in October of 2015. He returns for scheduled pacemaker follow-up today.  He feels well; he has no complaints. He denies palpitations, dizziness and syncope. He has no device related complaints.  He is  planning on taking his Niece who accompanies him today out for lunch.      Current Outpatient Prescriptions   Medication Sig Dispense Refill   . Acetaminophen 500 MG Oral Tab Take 2 tablet by mouth every 8 hours if needed to relieve pain 60 Tab prn   . Cyclobenzaprine HCl 5 MG Oral Tab Take 1-2 tabs as needed for muscle spasms of back. Start by taking at night, can increase to three times per day. 60 tablet 3   . Fluticasone Propionate 50 MCG/ACT Nasal Suspension Spray 2 sprays into each nostril daily. 1 Inhaler 3   . Warfarin Sodium 5 MG Oral Tab Warfarin 5mg  on Mondays/Wednesdays/Fridays and 7.5mg  on all other days of the week or as directed       No current facility-administered medications for this visit.        Review of patient's allergies indicates:  Allergies   Allergen Reactions   . Sulfa Antibiotics Rash       His family history is not on file.    He  reports that he has been smoking.  He has never used smokeless tobacco. He reports that he drinks alcohol. He reports that he does not use illicit drugs.    REVIEW OF SYSTEMS  A complete review of systems was performed, was remarkable for the  issues outlined in the history of present illness and problem list.  All other systems were negative.    PHYSICAL EXAM  General: Alert, pleasant, well-developed, well nourished appearing, in no distress.   CHEST: examination of the pacemaker implant site in the left upper chest shows it to be well healed without signs of infection or threatened erosion.  The remainder of the exam was deferred.    DIAGNOSTIC TESTING    Device Interrogation:   Atrial lead impedance is stable though continues to be below the normal cutoff of 300 ohms for this particular lead. Review of 6 episodes in the [facility] range showed AP/VS events consistent with sensor driven tachycardia. I increased his VHR rate up to 200 bpm.   CIED 06/30/2013 03/09/2014 07/06/2014 01/11/2015   Type PPM PPM PPM PPM   Manufacturer Medtronic Medtronic Medtronic Medtronic    Elective Replacement Indicator - - - 18 months   Generator Model Adapta L Adapta L Adapta L Adapta L   Date of Generator Implant 10/28/2006 10/28/2006 10/28/2006 10/28/2006   Generator Serial Number [id number]   Lead #1 Type Atrial Atrial Atrial Atrial   Model # [id number]   Lead #1 Serial # [id number] V [id number] V [id number] V [id number]   Lead #1 Implant Date 11/17/1997 11/17/1997 11/17/1997 11/17/1997   Lead #2 Type RV RV RV RV   Lead #2 Model # [id number]   Lead #2 Serial # [id number] V [id number] V [id number] V [id number]   Lead #2 Implant Date 11/17/1997 11/17/1997 11/17/1997 11/17/1997   Date of Service - - 07/06/2014 -   Battery Voltage 2.76V 2.74 2.72 2.73   P-Wave 0 0 2.0 0   R-Wave 11.2 0 11.2 15.6-22.4   Atrial impedence 377 309 285 264   RV impedence 1495 1429 1459 1558   Atrial Threshold 1.5V@0 .52 1.5V@0 .52 1.25@.76 1.25V@.76   RV Threshold 1V@0 .4 1V@0 .4 1.0@0 .4 1V@0 .4   Atrial Events 1 MS 1 MS - 0   Ventricular Events - 0 109 6   % atrial pacing 100 100 99.1 99.6   % ventricular pacing 0.3 0.3 0.1 0   Underlying Rhythm - CHB junctional 40 -   Mode AAIR<=>DDDR 60-180 AAIR<=>DDDR 60-180 AAIR 60-180 AAIR<=>DDDR 60-180       ASSESSMENT  Mr. Ehrler is a 29 year old male with a dual-chamber Medtronic pacemaker for transient heart block which is resolved and ongoing sinus node dysfunction with 100% atrial pacing.  Device function is normal with the notable exception of a chronic issue of low atrial lead impedances but adequate capture thresholds. Ventricular high rate episodes which are recorded on the device represent normal pacing, and settings were adjusted to not record episodes which occur due to normal rate responsive pacing.  We discussed options at the time of his next generator change with respect to the atrial lead, including: 1. Continue to use current atrial lead (implanted 1999), 2. Add new atrial lead (if vessel is patent), cap old atrial lead, 3. Lead extraction. Due  to the rare, but potentially life threatening complications associated with lead extraction, as well as the high likelihood of damaging the ventricular lead while extracting the atrial lead, unless there is a compelling indication for extraction, we discussed options 1 or 2. We also discussed the availability of remote monitoring with cellular technology. He was very interested in this option.     Recommendations:   Lesly Dukes to give patient details on remote wireless monitoring.    Remote device check  in 6 months   Follow up with me in 1 year   Anticipate he will need a generator change in the next one to 2 years    I spent a total time of over 30 minutes face-to-face with the patient, of which more than 50% of that time was spent in care coordination and counseling as outlined in this note.

## 2015-01-16 ENCOUNTER — Other Ambulatory Visit: Payer: Self-pay | Admitting: Family Medicine

## 2015-01-16 ENCOUNTER — Emergency Department
Admission: EM | Admit: 2015-01-16 | Discharge: 2015-01-16 | Disposition: A | Discharge status: Home / Self Care | Payer: No Typology Code available for payment source | Attending: Emergency Medicine | Admitting: Emergency Medicine

## 2015-01-16 DIAGNOSIS — R51 Headache: Secondary | ICD-10-CM

## 2015-01-16 DIAGNOSIS — R791 Abnormal coagulation profile: Secondary | ICD-10-CM

## 2015-01-16 LAB — CBC, DIFF
% Basophils: 1 %
% Eosinophils: 4 %
% Immature Granulocytes: 0 %
% Lymphocytes: 32 %
% Monocytes: 5 %
% Neutrophils: 58 %
Absolute Eosinophil Count: 0.37 10*3/uL (ref 0.00–0.50)
Absolute Lymphocyte Count: 2.86 10*3/uL (ref 1.00–4.80)
Basophils: 0.05 10*3/uL (ref 0.00–0.20)
Hematocrit: 40 % (ref 38–50)
Hemoglobin: 13.5 g/dL (ref 13.0–18.0)
Immature Granulocytes: 0.01 10*3/uL (ref 0.00–0.05)
MCH: 31.6 pg (ref 27.3–33.6)
MCHC: 33.6 g/dL (ref 32.2–36.5)
MCV: 94 fL (ref 81–98)
Monocytes: 0.48 10*3/uL (ref 0.00–0.80)
Neutrophils: 5.1 10*3/uL (ref 1.80–7.00)
Platelet Count: 202 10*3/uL (ref 150–400)
RBC: 4.27 10*6/uL — ABNORMAL LOW (ref 4.40–5.60)
RDW-CV: 12.9 % (ref 11.6–14.4)
WBC: 8.87 10*3/uL (ref 4.3–10.0)

## 2015-01-16 LAB — BASIC METABOLIC PANEL
Anion Gap: 5 (ref 4–12)
Calcium: 9.1 mg/dL (ref 8.9–10.2)
Carbon Dioxide, Total: 30 meq/L (ref 22–32)
Chloride: 104 meq/L (ref 98–108)
Creatinine: 0.96 mg/dL (ref 0.51–1.18)
GFR, Calc, African American: >60 mL/min (ref >59)
GFR, Calc, European American: >60 mL/min (ref >59)
Glucose: 91 mg/dL (ref 62–125)
Potassium: 4 meq/L (ref 3.6–5.2)
Sodium: 139 meq/L (ref 135–145)
Urea Nitrogen: 10 mg/dL (ref 8–21)

## 2015-01-16 LAB — PROTHROMBIN & PTT
Partial Thromboplastin Time: 47 s — ABNORMAL HIGH (ref 22–35)
Prothrombin INR: 3.7 — ABNORMAL HIGH (ref 0.8–1.3)
Prothrombin Time Patient: 35.3 s — ABNORMAL HIGH (ref 10.7–15.6)

## 2015-01-17 ENCOUNTER — Ambulatory Visit: Payer: No Typology Code available for payment source | Admitting: Pharmacist

## 2015-01-17 DIAGNOSIS — Z952 Presence of prosthetic heart valve: Secondary | ICD-10-CM

## 2015-01-17 DIAGNOSIS — Z7901 Long term (current) use of anticoagulants: Secondary | ICD-10-CM

## 2015-01-17 NOTE — Telephone Encounter (Signed)
ANTICOAGULATION TREATMENT PLAN    Indication: MVR; recurrent CVA  Goal INR: 2.5-3.5  Duration of Therapy: chronic    Hemorrhagic Risk Score: 1  Warfarin Tablet Size: 5mg     Relevant Historic Information: CVA 2008; 2005; 2006    Referring Provider: Nadene Rubins    SUBJECTIVE:  Walter Rice was last evaluated by [facility] on 01/05/2015 at which time the INR was 2.9 (goal 2.5-3.5) and no changes were made to the warfarin regimen.  Pt was scheduled for follow-up appt in 2 weeks.    In the interim, Walter Rice presented to ER yesterday on 01/16/15 with persistent headache. INR=3.7 (goal 2.5-3.5). Head CT was negative for intracranial hemorrhage. He was discharged home with instructions to rest up and monitor his headaches.    Today Walter Rice reports that his headaches are improving. He took Tylenol yesterday and today. He denies any unusual bruising or bleeding, and denies signs/sxs of TIA/stroke.     Diet: No changes reported since last ACC visit. He rarely has greens in his diet.  Activity:  No changes reported since last ACC visit.    WARFARIN DOSE:   Warfarin 5mg  MWF and 7.5mg  others since 12/25/14.   No warfarin dosing errors reported.    OBJECTIVE:  Relevant medication changes: Tylenol prn for headache.     LABS:   INR      3.7   01/16/2015  INR      2.9   01/05/2015  INR      3.7   12/22/2014  INR      3.5   12/12/2014  INR      4.1   12/08/2014    ASSESSMENT:   INR from 01/16/15 in ED is slightly above goal range of 2.5-3.5. Acute pain may have increased warfarin sensitivity. Will give a reduced warfarin dose x2 days and monitor closely.    PLAN:   1.  Warfarin 2.5mg  today, on 01/17/15, and 5mg  (instead of 7.5mg ) tomorrow on 01/18/15, then resume warfarin 5mg  MWF and 7.5mg  others.  2.  Return in 9 days (on 01/26/2015). Recommended rechecking in one week but this is the soonest he can go to lab.  3.  Pt. expressed understanding of plan outlined above.    Alfred Levins, PHARMD

## 2015-01-19 ENCOUNTER — Telehealth (HOSPITAL_BASED_OUTPATIENT_CLINIC_OR_DEPARTMENT_OTHER): Payer: Self-pay

## 2015-01-26 ENCOUNTER — Ambulatory Visit: Payer: No Typology Code available for payment source | Attending: Pharmacist | Admitting: Pharmacist

## 2015-01-26 ENCOUNTER — Other Ambulatory Visit (HOSPITAL_BASED_OUTPATIENT_CLINIC_OR_DEPARTMENT_OTHER): Payer: Self-pay

## 2015-01-26 DIAGNOSIS — Z7901 Long term (current) use of anticoagulants: Secondary | ICD-10-CM

## 2015-01-26 DIAGNOSIS — Z8673 Personal history of transient ischemic attack (TIA), and cerebral infarction without residual deficits: Secondary | ICD-10-CM

## 2015-01-26 DIAGNOSIS — Z952 Presence of prosthetic heart valve: Secondary | ICD-10-CM | POA: Insufficient documentation

## 2015-01-26 LAB — PROTHROMBIN TIME
Prothrombin INR: 2.9 — ABNORMAL HIGH (ref 0.8–1.3)
Prothrombin Time Patient: 29.2 s — ABNORMAL HIGH (ref 10.7–15.6)

## 2015-01-26 NOTE — Telephone Encounter (Addendum)
ANTICOAGULATION TREATMENT PLAN    Indication: MVR; recurrent CVA  Goal INR: 2.5-3.5  Duration of Therapy: chronic    Hemorrhagic Risk Score: 1  Warfarin Tablet Size: 5mg     Relevant Historic Information: CVA 2008; 2005; 2006    Referring Provider: Nadene Rubins    SUBJECTIVE:  Walter Rice was last evaluated by [facility] on 01/17/15 when his INR was 3.7 and he was instructed to take warfarin 2.5mg  on 01/17/15 and 5mg  (instead of 7.5mg ) on 01/18/15, then resume 5mg  MWF and 7.5mg  on all other days, and return in 9 days.    Spoke with patient on the phone. He reports no bruising, no bleeding, no S/Sx of CVA. He reports an episode of diarrhea yesterday, but this has resolved. His headaches are better. No other acute illness or changes in overall health. There are no changes in activity level or new medications. Patient rarely eats leafy greens, but had a salad on 01/23/15.        OBJECTIVE:    Current Warfarin dose:  Warfarin 2.5mg  on 01/17/15 and 5mg  (instead of 7.5mg ) on 01/18/15, then resume 5mg  MWF and 7.5mg  on all other days.    Patient reports that he started a new generic warfarin tablet yesterday from a different manufacturer.    INR      2.9   01/26/2015  INR      3.7   01/16/2015  INR      2.9   01/05/2015  INR      3.7   12/22/2014  INR      3.5   12/12/2014  INR      4.1   12/08/2014  INR      4.5   01/22/2012    ASSESSMENT:  Therapeutic INR after acute warfarin dose adjustment made at last [facility] visit. Will continue usual dose and monitor for possible changes due to different warfarin manufacturer.     PLAN:  1. Continue warfarin 5mg  MWF and 7.5mg  on all other days.  2. Return in 2 weeks (on 02/09/2015).  3. Patient expressed understanding of this plan.  4. Plan emailed to patient's sister and aunt.    Eloisa Northern, RPH

## 2015-02-09 ENCOUNTER — Telehealth (HOSPITAL_BASED_OUTPATIENT_CLINIC_OR_DEPARTMENT_OTHER): Payer: Self-pay

## 2015-02-14 ENCOUNTER — Encounter (HOSPITAL_BASED_OUTPATIENT_CLINIC_OR_DEPARTMENT_OTHER): Payer: Self-pay

## 2015-02-16 ENCOUNTER — Telehealth (HOSPITAL_BASED_OUTPATIENT_CLINIC_OR_DEPARTMENT_OTHER): Payer: Self-pay

## 2015-02-16 ENCOUNTER — Other Ambulatory Visit (HOSPITAL_BASED_OUTPATIENT_CLINIC_OR_DEPARTMENT_OTHER): Payer: No Typology Code available for payment source

## 2015-02-16 ENCOUNTER — Ambulatory Visit
Admit: 2015-02-16 | Discharge: 2015-02-16 | Disposition: A | Discharge status: Home / Self Care | Payer: No Typology Code available for payment source | Attending: Pharmacist | Admitting: Pharmacist

## 2015-02-16 DIAGNOSIS — Z952 Presence of prosthetic heart valve: Secondary | ICD-10-CM | POA: Insufficient documentation

## 2015-02-16 DIAGNOSIS — Z8673 Personal history of transient ischemic attack (TIA), and cerebral infarction without residual deficits: Secondary | ICD-10-CM

## 2015-02-16 DIAGNOSIS — Z7901 Long term (current) use of anticoagulants: Secondary | ICD-10-CM

## 2015-02-16 LAB — PROTHROMBIN TIME
Prothrombin INR: 3.6 — ABNORMAL HIGH (ref 0.8–1.3)
Prothrombin Time Patient: 34.6 s — ABNORMAL HIGH (ref 10.7–15.6)

## 2015-02-16 NOTE — Telephone Encounter (Signed)
Left message for patient to please call the Anticoagulation Clinic at [phone] as soon as possible to reschedule missed appointment or to notify us that a blood draw was done at a local laboratory.

## 2015-02-18 ENCOUNTER — Encounter (HOSPITAL_BASED_OUTPATIENT_CLINIC_OR_DEPARTMENT_OTHER): Payer: Self-pay | Admitting: Pharmacist

## 2015-02-18 NOTE — Progress Notes (Signed)
Patient had his INR drawn on 02/16/2015, a week later than expected. It is slightly elevated at 3.6, and a recent h/o being elevated. Left a message for him to take 5mg  tonight, instead of 7.5mg . He is to page me today or call the [facility] tomorrow to review his results.

## 2015-02-19 ENCOUNTER — Ambulatory Visit: Payer: No Typology Code available for payment source | Admitting: Pharmacist

## 2015-02-19 DIAGNOSIS — Z7901 Long term (current) use of anticoagulants: Secondary | ICD-10-CM

## 2015-02-19 DIAGNOSIS — Z952 Presence of prosthetic heart valve: Secondary | ICD-10-CM

## 2015-02-19 NOTE — Telephone Encounter (Addendum)
ANTICOAGULATION TREATMENT PLAN    Indication: MVR; recurrent CVA  Goal INR: 2.5-3.5  Duration of Therapy: chronic    Hemorrhagic Risk Score: 1  Warfarin Tablet Size: 5mg     Relevant Historic Information: CVA 2008; 2005; 2006    Referring Provider: Nadene Rubins    SUBJECTIVE:  Patient is 1 week overdue for his INR evaluation. He reports no bruising, no bleeding, no S/Sx of CVA. Denies melena or hematuria. There are no changes in medications, diet or activity.    OBJECTIVE:  Warfarin 5mg  MWF, 7.5mg  other days (45mg /wk). There were no warfarin dosing errors. He did not receive the message to take 5mg  of warfarin last night, and took his usual 7.5mg  dose.    LABS:  INR      3.6   02/16/2015  INR      2.9   01/26/2015  INR      3.7   01/16/2015  INR      2.9   01/05/2015  INR      3.7   12/22/2014  INR      3.5   12/12/2014  INR      4.5   01/22/2012    ASSESSMENT:  INR slightly supratherapeutic on current warfarin dose, with a recent history if being elevated. Will make another dose decrease today.    PLAN:  1. Decrease warfarin to 7.5mg  TTSu, 5mg  other days (42.5mg /wk).  2. Return in 11 days (on 03/02/2015).  3. Patient expressed understanding of this plan.    [address] [place], [place]

## 2015-03-02 ENCOUNTER — Ambulatory Visit: Payer: No Typology Code available for payment source | Attending: Pharmacist | Admitting: Pharmacist

## 2015-03-02 ENCOUNTER — Other Ambulatory Visit (HOSPITAL_BASED_OUTPATIENT_CLINIC_OR_DEPARTMENT_OTHER): Payer: Self-pay

## 2015-03-02 DIAGNOSIS — Z7901 Long term (current) use of anticoagulants: Secondary | ICD-10-CM | POA: Insufficient documentation

## 2015-03-02 DIAGNOSIS — Z8673 Personal history of transient ischemic attack (TIA), and cerebral infarction without residual deficits: Secondary | ICD-10-CM | POA: Insufficient documentation

## 2015-03-02 DIAGNOSIS — Z952 Presence of prosthetic heart valve: Secondary | ICD-10-CM | POA: Insufficient documentation

## 2015-03-02 LAB — PROTHROMBIN TIME
Prothrombin INR: 2.6 — ABNORMAL HIGH (ref 0.8–1.3)
Prothrombin Time Patient: 27.2 s — ABNORMAL HIGH (ref 10.7–15.6)

## 2015-03-02 NOTE — Telephone Encounter (Signed)
ANTICOAGULATION TREATMENT PLAN    Indication: MVR; recurrent CVA  Goal INR: 2.5-3.5  Duration of Therapy: chronic    Hemorrhagic Risk Score: 1  Warfarin Tablet Size: 5mg     Relevant Historic Information: CVA 2008; 2005; 2006    Referring Provider: Nadene Rubins    SUBJECTIVE:   Walter Rice was last evaluated by [facility] on 02/19/15.  His INR was 3.6 and he was instructed to lower his warfarin dose and return for follow-up in 11 days    I spoke with Mr. Orris by phone.  Pt denies any signs or symptoms of CVA.  Patient denies any unusual bleeding or bruising since last [facility] encounter.  No recent illnesses.  He is having green vegetables once weekly (no change) and activity level is stable.      OBJECTIVE:   Present anticoagulant dose: Warfarin 7.5mg  TTSu and 5mg  MWFSa  Dose decreased from 45mg /week to 42.5mg /week due to INR of 3.6    Relevant medication changes: none      LABS:   INR      2.6   03/02/2015  INR      3.6   02/16/2015  INR      2.9   01/26/2015  INR      3.7   01/16/2015  INR      2.9   01/05/2015      ASSESSMENT:   INR at lower end of target range of 2.5 to 3.5 following relatively small dose decrease 11 days ago      PLAN:   1) Warfarin 7.5mg  tonight 03/02/15 (instead of 5mg ) then RESUME warfarin 7.5mg  TTSu and 5mg  MWFSa    2) Return in 2 weeks (on 03/16/2015).    3) Patient is in agreement and verbally expressed understanding of the plan.    [name], PHARMD

## 2015-03-16 ENCOUNTER — Ambulatory Visit: Payer: No Typology Code available for payment source | Attending: Pharmacist | Admitting: Pharmacist

## 2015-03-16 ENCOUNTER — Telehealth (HOSPITAL_BASED_OUTPATIENT_CLINIC_OR_DEPARTMENT_OTHER): Payer: Self-pay

## 2015-03-16 ENCOUNTER — Other Ambulatory Visit (HOSPITAL_BASED_OUTPATIENT_CLINIC_OR_DEPARTMENT_OTHER): Payer: Self-pay

## 2015-03-16 DIAGNOSIS — Z7901 Long term (current) use of anticoagulants: Secondary | ICD-10-CM

## 2015-03-16 DIAGNOSIS — Z8673 Personal history of transient ischemic attack (TIA), and cerebral infarction without residual deficits: Secondary | ICD-10-CM

## 2015-03-16 DIAGNOSIS — Z952 Presence of prosthetic heart valve: Secondary | ICD-10-CM | POA: Insufficient documentation

## 2015-03-16 LAB — PROTHROMBIN TIME
Prothrombin INR: 3.2 — ABNORMAL HIGH (ref 0.8–1.3)
Prothrombin Time Patient: 31.6 s — ABNORMAL HIGH (ref 10.7–15.6)

## 2015-03-16 NOTE — Telephone Encounter (Signed)
ANTICOAGULATION TREATMENT PLAN    Indication: MVR; recurrent CVA  Goal INR: 2.5-3.5  Duration of Therapy: chronic    Hemorrhagic Risk Score: 1  Warfarin Tablet Size: 5mg     Relevant Historic Information: CVA 2008; 2005; 2006    Referring Provider: Nadene Rubins      SUBJECTIVE:   Walter Rice was last evaluated by [facility] on 03/02/2015.   His INR was 2.6 and he was instructed to continue his current warfarin dose and follow up with ACC in 2 weeks.     Today, patient denies any signs/symptoms of bleeding, unusual bruising, or signs/symptoms of TIA/CVA. No acute illnesses or overall changes in health. Diet stable with regards to vitamin k intake. Activity level unchanged. Denies changes in Rx/OTCs/herbal medications since previous [facility] visit. No upcoming procedures.     OBJECTIVE:     Current  dose:   Warfarin 7.5mg  (instead of 5mg ) on 03/02/15 when INR was 2.6. Patient then resumed warfarin at usual dose of 7.5mg  Tu Th Su, 5mg  on all other days of the week. Pt denies omissions or errors in warfarin dosing.      Relevant medication changes:   none      LABS:   INR      3.2   03/16/2015  INR      2.6   03/02/2015  INR      3.6   02/16/2015  INR      2.9   01/26/2015      ASSESSMENT:   Therapeutic INR on current warfarin dose in patient without warfarin-related complications.    As no reported changes in diet, lifestyle, or medications since last ACC assessment, it is reasonable to continue current maintenance dose and increase frequency of INR monitoring.    PLAN:   1. Continue warfarin 7.5mg  Tu Th Su, 5mg  on all other days of the week   2. Return in 4 weeks (on 04/13/2015).   3. Pt acknowledged understanding of this plan    Encarnacion Chu, [facility]

## 2015-03-16 NOTE — Telephone Encounter (Signed)
Received 4 calls today from [place] with questions regarding dental prophylaxis.      I stated that he needs to take Amox 2000 mg 30 minutes prior to dental appointments, and to have the dental office call me with any questions.  Karolee Stamps, RN

## 2015-04-12 ENCOUNTER — Telehealth (HOSPITAL_BASED_OUTPATIENT_CLINIC_OR_DEPARTMENT_OTHER): Payer: No Typology Code available for payment source | Admitting: Unknown Physician Specialty

## 2015-04-13 ENCOUNTER — Telehealth (HOSPITAL_BASED_OUTPATIENT_CLINIC_OR_DEPARTMENT_OTHER): Payer: Self-pay

## 2015-04-18 ENCOUNTER — Ambulatory Visit
Admit: 2015-04-18 | Discharge: 2015-04-18 | Disposition: A | Discharge status: Home / Self Care | Payer: No Typology Code available for payment source | Attending: Pharmacist | Admitting: Pharmacist

## 2015-04-18 ENCOUNTER — Other Ambulatory Visit (HOSPITAL_BASED_OUTPATIENT_CLINIC_OR_DEPARTMENT_OTHER): Payer: No Typology Code available for payment source

## 2015-04-18 ENCOUNTER — Telehealth (HOSPITAL_BASED_OUTPATIENT_CLINIC_OR_DEPARTMENT_OTHER): Payer: Self-pay

## 2015-04-18 DIAGNOSIS — Z7901 Long term (current) use of anticoagulants: Secondary | ICD-10-CM

## 2015-04-18 DIAGNOSIS — Z8673 Personal history of transient ischemic attack (TIA), and cerebral infarction without residual deficits: Secondary | ICD-10-CM

## 2015-04-18 DIAGNOSIS — Z952 Presence of prosthetic heart valve: Secondary | ICD-10-CM

## 2015-04-18 LAB — PROTHROMBIN TIME
Prothrombin INR: 2.2 — ABNORMAL HIGH (ref 0.8–1.3)
Prothrombin Time Patient: 23.7 s — ABNORMAL HIGH (ref 10.7–15.6)

## 2015-04-19 ENCOUNTER — Ambulatory Visit: Payer: No Typology Code available for payment source | Admitting: Pharmacist

## 2015-04-19 DIAGNOSIS — Z952 Presence of prosthetic heart valve: Secondary | ICD-10-CM

## 2015-04-19 DIAGNOSIS — Z7901 Long term (current) use of anticoagulants: Secondary | ICD-10-CM

## 2015-04-19 NOTE — Telephone Encounter (Addendum)
ANTICOAGULATION TREATMENT PLAN    Indication: MVR; recurrent CVA  Goal INR: 2.5-3.5  Duration of Therapy: chronic    Hemorrhagic Risk Score: 1  Warfarin Tablet Size: 5mg     Relevant Historic Information: CVA 2008; 2005; 2006    Referring Provider: Nadene Rubins      SUBJECTIVE:   Walter Rice was last evaluated by [facility] on 03/16/2015. His INR was 3.2 and he was instructed to continue his warfarin dose and follow up with ACC in 4 weeks.     Today, patient denies any signs/symptoms of bleeding, unusual bruising, or signs/symptoms of TIA/CVA. No acute illnesses or overall changes in health.     Patient reports he has been helping his girlfriend's parents move and has been very active this past week. Diet stable and remains minimal with regards to vitamin k intake. Denies changes in Rx/OTCs/herbal medications since previous [facility] visit. No upcoming procedures. No green tea. ETOH consumption consistent.     OBJECTIVE:     Current  dose:   Warfarin 7.5mg  Tu Th Sa, 5mg  on all other days of the week (ACC had Tu Th Su documented as 7.5mg  doses). Pt denies omissions or errors in warfarin dosing.      Relevant medication changes:   none      LABS:   INR      2.2   04/18/2015  INR      3.2   03/16/2015  INR      2.6   03/02/2015  INR      3.6   02/16/2015      ASSESSMENT:   Subtherapeutic INR possibly due to transient increase in activity level. No signs/sx of TIA/CVa.     PLAN:   1. Warfarin 10mg  (instead of 7.5mg ) today 04/19/2015, then resume warfarin 7.5mg  Tu Th Sa, 5mg  on all other days of the week   2. Return in 3 weeks (on 05/11/2015).   3. Emailed dosing information to patient's girlfriend [name]   4. Pt acknowledged understanding of this plan    Encarnacion Chu, [facility]

## 2015-05-11 ENCOUNTER — Ambulatory Visit: Payer: No Typology Code available for payment source | Attending: Pharmacist | Admitting: Pharmacist

## 2015-05-11 ENCOUNTER — Other Ambulatory Visit (HOSPITAL_BASED_OUTPATIENT_CLINIC_OR_DEPARTMENT_OTHER): Payer: Self-pay

## 2015-05-11 DIAGNOSIS — Z952 Presence of prosthetic heart valve: Secondary | ICD-10-CM

## 2015-05-11 DIAGNOSIS — Z7901 Long term (current) use of anticoagulants: Secondary | ICD-10-CM

## 2015-05-11 DIAGNOSIS — Z8673 Personal history of transient ischemic attack (TIA), and cerebral infarction without residual deficits: Secondary | ICD-10-CM

## 2015-05-11 LAB — PROTHROMBIN TIME
Prothrombin INR: 3.8 — ABNORMAL HIGH (ref 0.8–1.3)
Prothrombin Time Patient: 35.9 s — ABNORMAL HIGH (ref 10.7–15.6)

## 2015-05-11 NOTE — Telephone Encounter (Signed)
BLOOD DRAW LOCATION: [address]     04/19/2015: If cannot reach [name], can try [name] his girlfriend first at [phone]. Kimarion gave verbal authorization to discuss his anticoagulation management with [name].  EMAIL [name] with dosing instructions following every appointment: [email or web address]    Kim cell [phone] or work [phone] (try FIRST); Ruth:[phone]; [email or web address], [email or web address]   Cailan @ job skills [phone] M-F   No Coumadin day of INR.    ANTICOAGULATION TREATMENT PLAN    Indication: MVR; recurrent CVA  Goal INR: 2.5-3.5  Duration of Therapy: chronic    Hemorrhagic Risk Score: 1  Warfarin Tablet Size: 5mg     Relevant Historic Information: CVA 2008; 2005; 2006    Referring Provider: Nadene Rubins    SUBJECTIVE:   Patient Walter Rice was last evaluated in [name] on 04/19/15 and was advised to take one loading dose due to subtherapeutic INR and then resume his usual dose of warfarin.    Pt was rescheduled for appt in 3 weeks.     Spoke to pt about his INR result from today.  He denies bleeding or bruising.  Denies any signs/sxs of stroke/TIA.      No significant changes in diet, activity or health since last [facility] visit.     He reports consuming one beer on 05/13/15.  He drinks EtOH occasionally.       OBJECTIVE:   Present dose: Warfarin dose 7.5mg  on Tues/Thurs/Sat and 5mg  on all other days. No dosing error (denies missed or extra doses)   Relevant medication changes: none      LABS:   INR      3.8   05/11/2015  INR      2.2   04/18/2015  INR      3.2   03/16/2015  INR      2.6   03/02/2015  INR      3.6   02/16/2015  INR      2.9   01/26/2015  INR      4.5   01/22/2012     ASSESSMENT:   Slightly supratherapeutic INR possibly due to transient use of EtOH five days prior to current INR.  Will give one reduced dose to achieve therapeutic INR.     PLAN:   Take usual dose of 5mg  today and 5mg  instead of 7.5mg  tomorrow, on 05/12/2015, and then resume usual dose  of 7.5mg  on Tues/Thurs/Sat and 5mg  on all other days     Return in 2 weeks (on 05/28/2015) after he returns from [place].  He verbalized understanding and agreed to above plan.  Emailed instructions to his girlfriend, [name].    Royston Bake, PHARMD

## 2015-05-28 ENCOUNTER — Telehealth (HOSPITAL_BASED_OUTPATIENT_CLINIC_OR_DEPARTMENT_OTHER): Payer: Self-pay

## 2015-06-01 ENCOUNTER — Ambulatory Visit: Payer: No Typology Code available for payment source | Attending: Pharmacist | Admitting: Pharmacist

## 2015-06-01 ENCOUNTER — Other Ambulatory Visit (HOSPITAL_BASED_OUTPATIENT_CLINIC_OR_DEPARTMENT_OTHER): Payer: Self-pay

## 2015-06-01 DIAGNOSIS — Z8673 Personal history of transient ischemic attack (TIA), and cerebral infarction without residual deficits: Secondary | ICD-10-CM

## 2015-06-01 DIAGNOSIS — Z952 Presence of prosthetic heart valve: Secondary | ICD-10-CM

## 2015-06-01 DIAGNOSIS — Z7901 Long term (current) use of anticoagulants: Secondary | ICD-10-CM

## 2015-06-01 LAB — PROTHROMBIN TIME
Prothrombin INR: 3.3 — ABNORMAL HIGH (ref 0.8–1.3)
Prothrombin Time Patient: 32.4 s — ABNORMAL HIGH (ref 10.7–15.6)

## 2015-06-01 NOTE — Telephone Encounter (Signed)
ANTICOAGULATION TREATMENT PLAN    Indication: MVR; recurrent CVA  Goal INR: 2.5-3.5  Duration of Therapy: chronic    Hemorrhagic Risk Score: 1  Warfarin Tablet Size: 5mg     Relevant Historic Information: CVA 2008; 2005; 2006    Referring Provider: Nadene Rubins      SUBJECTIVE:   Walter Rice was last evaluated by [facility] on 05/11/2015. His INR was 3.8 and he was instructed to take a 1x reduced warfarin dose on 05/12/2015 and then resume his usual dose of 7.5mg  Tu Th Sa, 5mg  on all other days of the week.     Today, patient denies any signs/symptoms of bleeding, unusual bruising, or signs/symptoms of TIA/CVA. No acute illnesses or overall changes in health. Diet stable with regards to vitamin k intake. Activity level unchanged. Denies changes in Rx/OTCs/herbal medications since previous [facility] visit. No upcoming procedures.      OBJECTIVE:     Current  dose:   Warfarin 5mg  (instead of 7.5mg ) on 05/12/2015 due to INR of 3.8. Then, patient resumed his usual warfarin dose of 7.5mg  on Tu Th Sa, 5mg  on all other days of the week. Pt denies omissions or errors in warfarin dosing.      Relevant medication changes:   none      LABS:   INR      3.3   06/01/2015  INR      3.8   05/11/2015  INR      2.2   04/18/2015  INR      3.2   03/16/2015      ASSESSMENT:   INR back to therapeutic range on regular warfarin dose. No warfarin related complications.     PLAN:   1. Continue warfarin 7.5mg  on Tu Th Sa, 5mg  on all other days of the week    2. Return in 4 weeks (on 07/02/2015).   3. Pt acknowledged understanding of this plan    Willaim Bane, PHARMD

## 2015-07-02 ENCOUNTER — Telehealth (HOSPITAL_BASED_OUTPATIENT_CLINIC_OR_DEPARTMENT_OTHER): Payer: Self-pay

## 2015-07-05 ENCOUNTER — Encounter (HOSPITAL_BASED_OUTPATIENT_CLINIC_OR_DEPARTMENT_OTHER): Payer: Self-pay

## 2015-07-05 NOTE — Progress Notes (Signed)
Walter Rice is 3 days overdue for lab testing related to anticoagulant therapy. Left reminder message by telephone and mailed reminder letter today.

## 2015-07-06 ENCOUNTER — Other Ambulatory Visit (HOSPITAL_BASED_OUTPATIENT_CLINIC_OR_DEPARTMENT_OTHER): Payer: No Typology Code available for payment source

## 2015-07-06 ENCOUNTER — Ambulatory Visit
Admit: 2015-07-06 | Discharge: 2015-07-06 | Disposition: A | Discharge status: Home / Self Care | Payer: No Typology Code available for payment source | Attending: Pharmacist | Admitting: Pharmacist

## 2015-07-06 DIAGNOSIS — Z7901 Long term (current) use of anticoagulants: Secondary | ICD-10-CM | POA: Insufficient documentation

## 2015-07-06 DIAGNOSIS — Z8673 Personal history of transient ischemic attack (TIA), and cerebral infarction without residual deficits: Secondary | ICD-10-CM

## 2015-07-06 DIAGNOSIS — Z952 Presence of prosthetic heart valve: Secondary | ICD-10-CM

## 2015-07-07 ENCOUNTER — Ambulatory Visit: Payer: No Typology Code available for payment source | Admitting: Pharmacist

## 2015-07-07 DIAGNOSIS — Z7901 Long term (current) use of anticoagulants: Secondary | ICD-10-CM

## 2015-07-07 DIAGNOSIS — Z952 Presence of prosthetic heart valve: Secondary | ICD-10-CM

## 2015-07-07 LAB — PROTHROMBIN TIME
Prothrombin INR: 4.5 — ABNORMAL HIGH (ref 0.8–1.3)
Prothrombin Time Patient: 40.9 s — ABNORMAL HIGH (ref 10.7–15.6)

## 2015-07-07 NOTE — Telephone Encounter (Signed)
ANTICOAGULATION TREATMENT PLAN    Indication: MVR; recurrent CVA  Goal INR: 2.5-3.5  Duration of Therapy: chronic    Hemorrhagic Risk Score: 1  Warfarin Tablet Size: 5mg     Relevant Historic Information: CVA 2008; 2005; 2006    Referring Provider: Nadene Rubins    SUBJECTIVE:   Walter Rice was last evaluated by [facility] on 06/01/15.  His INR was 3.3 and he was instructed to continue his current dose and return for follow-up in 4 weeks' time.      I was paged by the lab on 07/06/15 with Walter Rice' INR result of 4.5.  I contacted Walter Rice but he asked me to speak with his fiance, [name].  He denies any bleeding or bruising noted since last [name] encounter.  No fever or vomiting or diarrhea.  No signs or symptoms of CVA.  No use of alcohol or acetaminophen.  No change in diet.  [name] states he stopped smoking approximately 1 month ago but is using e-cigarettes (vape).      OBJECTIVE:   Present anticoagulant dose: Warfarin 7.5mg  TUES,THU,SAT and 5mg  others  No dosing errors.      Relevant medication changes: none      LABS:   INR      4.5   07/06/2015  INR      3.3   06/01/2015  INR      3.8   05/11/2015  INR      2.2   04/18/2015  INR      3.2   03/16/2015      ASSESSMENT:   INR above target range of 2.5 to 3.5 in pt without signs of bleeding.  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  Discontinuation of cigarette smoking may thus result in a decrease in warfarin metabolism      PLAN:   1) HOLD warfarin 07/05/15 then DECREASE warfarin dose to 7.5mg  MON+THU and 5mg  others.  Emailed this to [place] per her request.    2) Return in 7 days on 07/13/15    3) [name] is in agreement and verbally expressed understanding of the plan.    [name], PHARMD

## 2015-07-10 ENCOUNTER — Other Ambulatory Visit (HOSPITAL_BASED_OUTPATIENT_CLINIC_OR_DEPARTMENT_OTHER): Payer: No Typology Code available for payment source

## 2015-07-10 ENCOUNTER — Encounter (HOSPITAL_BASED_OUTPATIENT_CLINIC_OR_DEPARTMENT_OTHER): Payer: No Typology Code available for payment source | Admitting: Pediatric Cardiology

## 2015-07-13 ENCOUNTER — Other Ambulatory Visit (HOSPITAL_BASED_OUTPATIENT_CLINIC_OR_DEPARTMENT_OTHER): Payer: No Typology Code available for payment source

## 2015-07-13 ENCOUNTER — Ambulatory Visit
Admit: 2015-07-13 | Discharge: 2015-07-13 | Disposition: A | Discharge status: Home / Self Care | Payer: No Typology Code available for payment source | Attending: Pharmacist | Admitting: Pharmacist

## 2015-07-13 ENCOUNTER — Telehealth (HOSPITAL_BASED_OUTPATIENT_CLINIC_OR_DEPARTMENT_OTHER): Payer: Self-pay

## 2015-07-13 DIAGNOSIS — Z7901 Long term (current) use of anticoagulants: Secondary | ICD-10-CM | POA: Insufficient documentation

## 2015-07-13 DIAGNOSIS — Z8673 Personal history of transient ischemic attack (TIA), and cerebral infarction without residual deficits: Secondary | ICD-10-CM | POA: Insufficient documentation

## 2015-07-13 DIAGNOSIS — Z952 Presence of prosthetic heart valve: Secondary | ICD-10-CM

## 2015-07-13 LAB — PROTHROMBIN TIME
Prothrombin INR: 3 — ABNORMAL HIGH (ref 0.8–1.3)
Prothrombin Time Patient: 30.1 s — ABNORMAL HIGH (ref 10.7–15.6)

## 2015-07-16 ENCOUNTER — Ambulatory Visit: Payer: No Typology Code available for payment source | Admitting: Pharmacist

## 2015-07-16 DIAGNOSIS — Z7901 Long term (current) use of anticoagulants: Secondary | ICD-10-CM

## 2015-07-16 DIAGNOSIS — Z952 Presence of prosthetic heart valve: Secondary | ICD-10-CM

## 2015-07-16 NOTE — Telephone Encounter (Signed)
ANTICOAGULATION TREATMENT PLAN    Indication: MVR; recurrent CVA  Goal INR: 2.5-3.5  Duration of Therapy: chronic    Hemorrhagic Risk Score: 1  Warfarin Tablet Size: 5mg     Relevant Historic Information: CVA 2008; 2005; 2006    Referring Provider: Nadene Rubins    SUBJECTIVE:  Walter Rice was last evaluated by [name] on 07/07/2015 at which time the INR from 9/30 was elevated at 4.5  (goal 2.5-3.5), possibly due to giving up tobacco. He was instructed to reduce his warfarin dose.  He was scheduled for follow-up appt in 1 week.    I spoke with his fiance, [name]. She reports a bruise on his arm from blood draw 10 days ago which is fading. No new bruises and she denies sxs of bleeding. No signs/sxs of TIA/stroke.     Diet: No changes reported since last [facility] visit. He mostly avoids green leafy vegetables.    Alcohol: He has an occasional 1-2 beers.  Tobacco: Quit about 1 month ago as reported at last visit. Uses e-cigarettes with low nicotine level.  Activity:  No changes reported since last [facility] visit.    WARFARIN DOSE:   Warfarin 7.5mg  MON+THU and 5mg  others (40mg /week), decreased from 42.5mg /week) on 07/07/15.  No warfarin dosing errors reported.    OBJECTIVE:  Relevant medication changes: None reported.     LABS:   INR      3.0   07/13/2015  INR      4.5   07/06/2015  INR      3.3   06/01/2015  INR      3.8   05/11/2015  INR      2.2   04/18/2015  INR      3.2   03/16/2015  INR      2.6   03/02/2015    ASSESSMENT:   Therapeutic INR following warfarin dose adjustment made at last [facility] visit. Need to give current dose more time.     PLAN:   1.  Continue current warfarin dose of 7.5mg  M+Th, and 5mg  on all others (40mg /week).   2.  Return in 4 days (on 07/20/2015) which is one week from last INR test. F/U with [facility] on Monday, 07/23/15 since results will unlikely be back before then.   3.  [name] (fiance) expressed understanding of plan outlined above.    Alfred Levins, PHARMD

## 2015-07-17 ENCOUNTER — Ambulatory Visit
Payer: No Typology Code available for payment source | Attending: Cardiovascular Disease | Admitting: Pediatric Cardiology

## 2015-07-17 ENCOUNTER — Encounter (HOSPITAL_BASED_OUTPATIENT_CLINIC_OR_DEPARTMENT_OTHER): Payer: No Typology Code available for payment source | Admitting: Cardiovascular Disease

## 2015-07-17 ENCOUNTER — Encounter (HOSPITAL_BASED_OUTPATIENT_CLINIC_OR_DEPARTMENT_OTHER): Payer: Self-pay | Admitting: Pediatric Cardiology

## 2015-07-17 ENCOUNTER — Other Ambulatory Visit (HOSPITAL_COMMUNITY): Payer: Self-pay

## 2015-07-17 ENCOUNTER — Ambulatory Visit (HOSPITAL_BASED_OUTPATIENT_CLINIC_OR_DEPARTMENT_OTHER): Payer: No Typology Code available for payment source

## 2015-07-17 VITALS — BP 105/70 | HR 63 | Ht 72.0 in | Wt 171.0 lb

## 2015-07-17 DIAGNOSIS — Q212 Atrioventricular septal defect, unspecified as to partial or complete: Secondary | ICD-10-CM

## 2015-07-17 DIAGNOSIS — I340 Nonrheumatic mitral (valve) insufficiency: Secondary | ICD-10-CM

## 2015-07-17 DIAGNOSIS — Z952 Presence of prosthetic heart valve: Secondary | ICD-10-CM

## 2015-07-17 NOTE — Progress Notes (Signed)
[facility] FOLLOW UP VISIT NOTE    Walter Rice is a 29 year old male who presents to [facility] clinic for routine follow up.    PROBLEM LIST  Patient Active Problem List    Diagnosis Date Noted   . 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     A) Status post repair with ventricular septal defect closure in February 1989.   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 Jude prosthetic valve in 1993.       . 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       . Mitral valve replaced [Z95.2] 04/13/2013   . Chronic anticoagulation [Z79.01] 02/07/2013     Walter Rice ACC ENROLLED     . CVA (cerebral vascular accident) (HCC) [I63.9] 09/10/2011   . Low back pain [M54.5] 08/04/2014   . Weakness of trunk musculature [M62.81] 08/04/2014   . Hamstring tightness of both lower extremities [M62.9] 08/04/2014   . Midline low back pain without sciatica [M54.5] 08/04/2014   . 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  TX FROM [place]     . Headache [R51]      TX FROM [place]     . Sprain of ankle- Right 06/2011 [S93.409A] 06/16/2011     Walter Rice was last seen by me a year ago and in the interim has been well.  He has seen Dr. Suella Rice for his EP issues and is noted to have about 2 years of generator battery life left.  He has quit smoking.  His INR has been a bit labile, particularly after quitting smoking.  He currently is unemployed and looking for another job.  No regular exercise.  No chest pain, shortness of breath, exercise intolerance,  palpitations, dizziness or syncope.      Current Outpatient Prescriptions   Medication Sig Dispense Refill   . Acetaminophen 500 MG Oral Tab Take 2 tablet by mouth every 8 hours if needed to relieve pain 60 Tab prn   . Cyclobenzaprine HCl 5 MG Oral Tab Take 1-2 tabs as needed for muscle spasms of  back. Start by taking at night, can increase to three times per day. 60 tablet 3   . Fluticasone Propionate 50 MCG/ACT Nasal Suspension Spray 2 sprays into each nostril daily. 1 Inhaler 3   . Warfarin Sodium 5 MG Oral Tab Warfarin 7.5mg  on Tu Th,  on all other days of the week       No current facility-administered medications for this visit.       ROS  Complete review of systems obtained, reviewed and is noted in the cardiology intake form.    PHYSICAL EXAM:   Filed Vitals:    07/17/15 1131   BP: 105/70   Pulse: 63   Height: 6' (1.829 m)   Weight: 171 lb (77.565 kg)   SpO2: 99%     General: Not in acute distress  HEENT: Sclera non-icteric, moist oral mucous membranes  Neck: No jugular venous distension  Lungs: Clear to auscultation bilaterally   CV: RRR mech S1 nl S2 II/VI early DM.  Slight DR at apex (I/IV)  Abdomen: Soft, non-tender, non-distended, no hepatomegaly  Musculoskeletal/extremities:  No cyanosis, clubbing or edema.  2+ symmetric pulses in all 4 extremities  Skin:  Well healed median sternotomy  scar, no other abnormal findings  Neuro:  Normal gait    DIAGNOSTIC STUDIES  Electrocardiogram was performed, reviewed and showed atrial pacing.      Echocardiogram was performed, reviewed and showed a well placed MVR.  Mean gradient 6-7 mmHg.  Mild-mod AI.  Normal LV size and function.      All studies were reviewed with the patient.      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 Walter Rice is stable from a cardiac standpoint.  He will continue to see Dr. Suella Rice for his pacer care. I'd like to see him in a year without testing.  He needs strict SBE PPX.  No activity restriction is needed.      Walter Chain, MD  Adult Congenital Heart Disease

## 2015-07-18 ENCOUNTER — Ambulatory Visit
Payer: No Typology Code available for payment source | Attending: Cardiovascular Disease | Admitting: Unknown Physician Specialty

## 2015-07-18 DIAGNOSIS — I495 Sick sinus syndrome: Secondary | ICD-10-CM | POA: Insufficient documentation

## 2015-07-18 NOTE — Progress Notes (Signed)
[facility] Note    This is a Routine Remote Pacer check. The device was placed for a history of SSS. He is being followed by Dr. Alfonse Spruce.    The presenting rhythm is ApVs @ 75. He is Ap 99.2% Vp 1.4%.     The battery, lead impedance and measured data is stable.     There are no episodes in the log.     The histograms show rate distribution from 60-80.    The follow up appt is 3 months remote.  There has been a gradual decrease in the atrial impedance as noted by Dr Suella Broad when he saw the pt 4/16.  Pt states he is feeling well, gave him remote results.  Remote transmission in MM    Clovis Fredrickson, RN    [facility]  CIED 06/30/2013 03/09/2014 07/06/2014 01/11/2015 07/18/2015   Type PPM PPM PPM PPM PPM   Manufacturer Medtronic Medtronic Medtronic Medtronic Medtronic   Elective Replacement Indicator - - - 18 months 2.59   Generator Model Adapta L Adapta L Adapta L Adapta L Adapta L   Date of Generator Implant 10/28/2006 10/28/2006 10/28/2006 10/28/2006 10/28/2006   Generator Serial Number [id number]   Lead #1 Type Atrial Atrial Atrial Atrial Atrial   Model # [id number] [id number]   Lead #1 Serial # [id number] V [id number] V [id number] V [id number] V [id number]   Lead #1 Implant Date 11/17/1997 11/17/1997 11/17/1997 11/17/1997 11/17/1997   Lead #2 Type RV RV RV RV RV   Lead #2 Model # [id number]   Lead #2 Serial # [id number] V [id number] V [id number] V [id number] V [id number]   Lead #2 Implant Date 11/17/1997 11/17/1997 11/17/1997 11/17/1997 11/17/1997   Lead # 4 Serial # - - - - remote   Date of Service - - 07/06/2014 - 07/18/2015   Battery Voltage 2.76V 2.74 2.72 2.73 2.71V/<3 months   P-Wave 0 0 2.0 0 -   R-Wave 11.2 0 11.2 15.6-22.4 11.2   Atrial impedence 377 309 285 264 232   RV impedence 1495 1429 1459 1558 1608   Atrial Threshold 1.5V@0 .52 1.5V@0 .52 1.25@.76 1.25V@.76 2.5@0 .4   RV Threshold 1V@0 .4 1V@0 .4 1.0@0 .4 1V@0 .4 1.125@0 .4   Atrial Events 1 MS 1 MS - 0 0      Ventricular Events - 0 109 6 0   % atrial pacing 100 100 99.1 99.6 99.2   % ventricular pacing 0.3 0.3 0.1 0 1.4   Underlying Rhythm - CHB junctional 40 - n/a   Mode AAIR<=>DDDR 60-180 AAIR<=>DDDR 60-180 AAIR 60-180 AAIR<=>DDDR 60-180 DDDR/AAIR 60-180

## 2015-07-19 ENCOUNTER — Telehealth (HOSPITAL_BASED_OUTPATIENT_CLINIC_OR_DEPARTMENT_OTHER): Payer: No Typology Code available for payment source | Admitting: Unknown Physician Specialty

## 2015-07-19 NOTE — Progress Notes (Signed)
Atrial lead threshold and impedance elevated.  Stable battery parameters.  No arrhythmias.  Will send to Dr. Suella Broad - ventricular lead is stable.

## 2015-07-20 ENCOUNTER — Telehealth (HOSPITAL_BASED_OUTPATIENT_CLINIC_OR_DEPARTMENT_OTHER): Payer: Self-pay

## 2015-07-21 ENCOUNTER — Other Ambulatory Visit (HOSPITAL_BASED_OUTPATIENT_CLINIC_OR_DEPARTMENT_OTHER): Payer: No Typology Code available for payment source

## 2015-07-21 ENCOUNTER — Other Ambulatory Visit (INDEPENDENT_AMBULATORY_CARE_PROVIDER_SITE_OTHER): Payer: No Typology Code available for payment source

## 2015-07-21 ENCOUNTER — Ambulatory Visit
Admit: 2015-07-21 | Discharge: 2015-07-21 | Disposition: A | Discharge status: Home / Self Care | Payer: No Typology Code available for payment source | Attending: Pharmacist | Admitting: Pharmacist

## 2015-07-21 DIAGNOSIS — Z5189 Encounter for other specified aftercare: Secondary | ICD-10-CM

## 2015-07-21 DIAGNOSIS — Z952 Presence of prosthetic heart valve: Secondary | ICD-10-CM

## 2015-07-21 DIAGNOSIS — Z7901 Long term (current) use of anticoagulants: Secondary | ICD-10-CM | POA: Insufficient documentation

## 2015-07-21 DIAGNOSIS — Z8673 Personal history of transient ischemic attack (TIA), and cerebral infarction without residual deficits: Secondary | ICD-10-CM

## 2015-07-21 LAB — PROTHROMBIN TIME
Prothrombin INR: 2.9 — ABNORMAL HIGH (ref 0.8–1.3)
Prothrombin Time Patient: 29.5 s — ABNORMAL HIGH (ref 10.7–15.6)

## 2015-07-21 NOTE — Progress Notes (Signed)
COURTESY PROTIME

## 2015-07-23 ENCOUNTER — Ambulatory Visit: Payer: No Typology Code available for payment source | Admitting: Pharmacist

## 2015-07-23 DIAGNOSIS — Z952 Presence of prosthetic heart valve: Secondary | ICD-10-CM

## 2015-07-23 DIAGNOSIS — Z7901 Long term (current) use of anticoagulants: Secondary | ICD-10-CM

## 2015-07-23 NOTE — Telephone Encounter (Signed)
ANTICOAGULATION TREATMENT PLAN    Indication: MVR; recurrent CVA  Goal INR: 2.5-3.5  Duration of Therapy: chronic    Hemorrhagic Risk Score: 1  Warfarin Tablet Size: 5mg     Relevant Historic Information: CVA 2008; 2005; 2006    Referring Provider: Nadene Rubins      SUBJECTIVE:   [name] was last evaluated by [facility] on 07/16/15.   His INR was 3.0 on 07/13/15 and he was instructed to continue warfarin 7.5mg  Mon/Thurs, 5mg  all other days with followup in 4 days (a week from the INR result).    Today, I spoke to Harmony, his fiance, and she reports no s/sx of bleeding, bruising, or CVA/TIA. Diet is the same. Activity level is consistent. There have been no acute illnesses or changes to overall health. No medication changes and no upcoming procedures. He quit tobacco over a month ago and is still vaping. He has had no alcohol intake in the last few days.    OBJECTIVE:     Current  dose:   Warfarin 7.5mg  Mon/Thurs, 5mg  all other days. No errors reported.    Relevant medication changes:   none       LABS:   INR      2.9   07/21/2015  INR      3.0   07/13/2015  INR      4.5   07/06/2015  INR      3.3   06/01/2015  INR      3.8   05/11/2015      ASSESSMENT:   INR is still within therapeutic range, therefore it is reasonable to continue the same dose for now. No warfarin related complications     PLAN:   1. Continue warfarin 7.5mg  Mon/Thurs, 5mg  all other days (40mg /wk)  2. Return in 7 days (on 07/30/2015).   3. [name] acknowledged understanding of this plan    Dianna Limbo, PHARMD

## 2015-07-25 ENCOUNTER — Other Ambulatory Visit: Payer: Self-pay

## 2015-07-30 ENCOUNTER — Other Ambulatory Visit (HOSPITAL_BASED_OUTPATIENT_CLINIC_OR_DEPARTMENT_OTHER): Payer: No Typology Code available for payment source

## 2015-07-30 ENCOUNTER — Ambulatory Visit
Admit: 2015-07-30 | Discharge: 2015-07-30 | Disposition: A | Discharge status: Home / Self Care | Payer: No Typology Code available for payment source | Attending: Pharmacist | Admitting: Pharmacist

## 2015-07-30 ENCOUNTER — Other Ambulatory Visit (INDEPENDENT_AMBULATORY_CARE_PROVIDER_SITE_OTHER): Payer: No Typology Code available for payment source

## 2015-07-30 ENCOUNTER — Telehealth (HOSPITAL_BASED_OUTPATIENT_CLINIC_OR_DEPARTMENT_OTHER): Payer: Self-pay

## 2015-07-30 DIAGNOSIS — Z952 Presence of prosthetic heart valve: Secondary | ICD-10-CM | POA: Insufficient documentation

## 2015-07-30 DIAGNOSIS — Z8673 Personal history of transient ischemic attack (TIA), and cerebral infarction without residual deficits: Secondary | ICD-10-CM | POA: Insufficient documentation

## 2015-07-30 DIAGNOSIS — Q212 Atrioventricular septal defect, unspecified as to partial or complete: Secondary | ICD-10-CM

## 2015-07-30 DIAGNOSIS — Z7901 Long term (current) use of anticoagulants: Secondary | ICD-10-CM | POA: Insufficient documentation

## 2015-07-30 NOTE — Progress Notes (Signed)
Courtesy INR

## 2015-07-31 ENCOUNTER — Telehealth (HOSPITAL_BASED_OUTPATIENT_CLINIC_OR_DEPARTMENT_OTHER): Payer: Self-pay

## 2015-07-31 LAB — PROTHROMBIN TIME
Prothrombin INR: 3.2 — ABNORMAL HIGH (ref 0.8–1.3)
Prothrombin Time Patient: 31.3 s — ABNORMAL HIGH (ref 10.7–15.6)

## 2015-08-01 ENCOUNTER — Ambulatory Visit: Payer: No Typology Code available for payment source | Admitting: Pharmacist

## 2015-08-01 DIAGNOSIS — Z7901 Long term (current) use of anticoagulants: Secondary | ICD-10-CM

## 2015-08-01 DIAGNOSIS — Z952 Presence of prosthetic heart valve: Secondary | ICD-10-CM

## 2015-08-01 NOTE — Telephone Encounter (Signed)
ANTICOAGULATION TREATMENT PLAN    Indication: MVR; recurrent CVA  Goal INR: 2.5-3.5  Duration of Therapy: chronic    Hemorrhagic Risk Score: 1  Warfarin Tablet Size: 5mg     Relevant Historic Information: CVA 2008; 2005; 2006    Referring Provider: Nadene Rubins      SUBJECTIVE:   Walter Rice was last evaluated by [facility] on 07/23/15.   His INR was 2.9 and he was instructed to continue warfarin 7.5mg  Mon.Thurs, 5mg  all other days with followup in 1 week.    Today, I spoke to Walter Rice, and he reports no s/sx of bleeding or bruising. No s/sx of  CVA/TIA. His diet is the same. His activity level is consistent. There have been no acute illnesses or changes to overall health. No medication changes and no upcoming procedures.    OBJECTIVE:     Current  dose:   Warfarin 7.5mg  Mon/Thurs, 5mg  all other days (40mg /wk). No errors reported.    Relevant medication changes:   none       LABS:   INR      3.2   07/30/2015  INR      2.9   07/21/2015  INR      3.0   07/13/2015  INR      4.5   07/06/2015  INR      3.3   06/01/2015      ASSESSMENT:   Therapeutic INR on current warfarin dose in patient without warfarin related complications.     PLAN:   1. Continue warfarin 7.5mg  Mon/Thurs, 5mg  all other days  (40mg /wk)  2. Return in 9 days (on 08/10/2015). Fridays work best for him and his girlfriend, but the following Friday is Veteran's Day  3. [name] acknowledged understanding of this plan. Email sent to [facility] with dosing instructions.    Dianna Limbo, PHARMD

## 2015-08-10 ENCOUNTER — Ambulatory Visit
Admit: 2015-08-10 | Discharge: 2015-08-10 | Disposition: A | Discharge status: Home / Self Care | Payer: No Typology Code available for payment source | Attending: Pharmacist | Admitting: Pharmacist

## 2015-08-10 ENCOUNTER — Other Ambulatory Visit (HOSPITAL_BASED_OUTPATIENT_CLINIC_OR_DEPARTMENT_OTHER): Payer: No Typology Code available for payment source

## 2015-08-10 ENCOUNTER — Telehealth (HOSPITAL_BASED_OUTPATIENT_CLINIC_OR_DEPARTMENT_OTHER): Payer: Self-pay

## 2015-08-10 ENCOUNTER — Other Ambulatory Visit (INDEPENDENT_AMBULATORY_CARE_PROVIDER_SITE_OTHER): Payer: No Typology Code available for payment source

## 2015-08-10 DIAGNOSIS — Z8673 Personal history of transient ischemic attack (TIA), and cerebral infarction without residual deficits: Secondary | ICD-10-CM

## 2015-08-10 DIAGNOSIS — Z952 Presence of prosthetic heart valve: Secondary | ICD-10-CM

## 2015-08-10 DIAGNOSIS — Z5189 Encounter for other specified aftercare: Secondary | ICD-10-CM

## 2015-08-10 DIAGNOSIS — Z7901 Long term (current) use of anticoagulants: Secondary | ICD-10-CM

## 2015-08-10 NOTE — Progress Notes (Signed)
courtesy

## 2015-08-11 LAB — PROTHROMBIN TIME
Prothrombin INR: 3.8 — ABNORMAL HIGH (ref 0.8–1.3)
Prothrombin Time Patient: 35.8 s — ABNORMAL HIGH (ref 10.7–15.6)

## 2015-08-13 ENCOUNTER — Ambulatory Visit: Payer: No Typology Code available for payment source | Admitting: Pharmacist

## 2015-08-13 DIAGNOSIS — Z7901 Long term (current) use of anticoagulants: Secondary | ICD-10-CM

## 2015-08-13 DIAGNOSIS — Z952 Presence of prosthetic heart valve: Secondary | ICD-10-CM

## 2015-08-13 NOTE — Telephone Encounter (Signed)
ANTICOAGULATION TREATMENT PLAN    Indication: MVR; recurrent CVA  Goal INR: 2.5-3.5  Duration of Therapy: chronic    Hemorrhagic Risk Score: 1  Warfarin Tablet Size: 5mg     Relevant Historic Information: CVA 2008; 2005; 2006    Referring Provider: Nadene Rubins      SUBJECTIVE:   Walter Rice was last evaluated by [facility] on 08/01/15.   His INR was 3.2 and he was instructed to continue warfarin 7.5mg  Mon/Thurs, 5mg  all other days with followup in 9 days.    Today, I spoke to Walter Rice, and he reports no s/sx of bleeding or bruising. No s/sx of CVA/TIA. His diet is the same. His activity level is the same. No alcohol. He had a sore throat last week, but no other symptoms. There have been no other acute illnesses or changes to overall health. No medication changes and no upcoming procedures.    OBJECTIVE:     Current  dose:   Warfarin 7.5mg  Mon/Thurs, 5mg  all other days (40mg /wk). No errors reported.    Relevant medication changes:   none       LABS:   INR      3.8   08/10/2015  INR      3.2   07/30/2015  INR      2.9   07/21/2015  INR      3.0   07/13/2015  INR      4.5   07/06/2015      ASSESSMENT:   INR supratherapeutic without any clear etiology. Will give a reduced dose and then have Walter Rice resume his usual dose and monitor closely. If elevated at the next visit, will consider reducing his dose    PLAN:   1. Take 5mg  x 1 (Mon, 08/13/15), then resume 7.5mg  Mon/Thurs, 5mg  all other days (40mg /wk)   2. Return in 11 days (on 08/24/2015).   3. Walter Rice acknowledged understanding of this plan. Plan emailed to his fiance [name].     Dianna Limbo, PHARMD

## 2015-08-24 ENCOUNTER — Telehealth (HOSPITAL_BASED_OUTPATIENT_CLINIC_OR_DEPARTMENT_OTHER): Payer: Self-pay

## 2015-08-29 ENCOUNTER — Encounter (HOSPITAL_BASED_OUTPATIENT_CLINIC_OR_DEPARTMENT_OTHER): Payer: Self-pay

## 2015-08-29 ENCOUNTER — Telehealth (HOSPITAL_BASED_OUTPATIENT_CLINIC_OR_DEPARTMENT_OTHER): Payer: Self-pay

## 2015-08-29 NOTE — Telephone Encounter (Signed)
Called and left a message regarding missed appt on ( 08/24/15   )- advised pt to contact [name] for a future appt.

## 2015-08-29 NOTE — Progress Notes (Signed)
Walter Rice is 3 days overdue for lab testing related to anticoagulant therapy. Left reminder message by telephone and mailed reminder letter today.

## 2015-09-07 ENCOUNTER — Telehealth (HOSPITAL_BASED_OUTPATIENT_CLINIC_OR_DEPARTMENT_OTHER): Payer: Self-pay

## 2015-09-07 ENCOUNTER — Other Ambulatory Visit (HOSPITAL_BASED_OUTPATIENT_CLINIC_OR_DEPARTMENT_OTHER): Payer: Self-pay | Admitting: Pharmacotherapy

## 2015-09-07 DIAGNOSIS — Z7901 Long term (current) use of anticoagulants: Secondary | ICD-10-CM

## 2015-09-07 DIAGNOSIS — Z952 Presence of prosthetic heart valve: Secondary | ICD-10-CM

## 2015-09-07 MED ORDER — WARFARIN SODIUM 5 MG OR TABS
ORAL_TABLET | ORAL | Status: DC
Start: 2015-09-07 — End: 2016-01-01

## 2015-09-07 NOTE — Telephone Encounter (Signed)
Walter Rice is 2 weeks overdue for lab testing related to anticoagulant therapy.  Left reminder message by telephone.

## 2015-09-08 ENCOUNTER — Other Ambulatory Visit (HOSPITAL_BASED_OUTPATIENT_CLINIC_OR_DEPARTMENT_OTHER): Payer: No Typology Code available for payment source

## 2015-09-08 ENCOUNTER — Telehealth (INDEPENDENT_AMBULATORY_CARE_PROVIDER_SITE_OTHER): Payer: Self-pay | Admitting: Internal Medicine

## 2015-09-08 ENCOUNTER — Other Ambulatory Visit (INDEPENDENT_AMBULATORY_CARE_PROVIDER_SITE_OTHER): Payer: No Typology Code available for payment source

## 2015-09-08 ENCOUNTER — Ambulatory Visit
Admit: 2015-09-08 | Discharge: 2015-09-08 | Disposition: A | Discharge status: Home / Self Care | Payer: No Typology Code available for payment source | Attending: Pharmacist | Admitting: Pharmacist

## 2015-09-08 DIAGNOSIS — Z952 Presence of prosthetic heart valve: Secondary | ICD-10-CM

## 2015-09-08 DIAGNOSIS — Z7901 Long term (current) use of anticoagulants: Secondary | ICD-10-CM | POA: Insufficient documentation

## 2015-09-08 DIAGNOSIS — Z8673 Personal history of transient ischemic attack (TIA), and cerebral infarction without residual deficits: Secondary | ICD-10-CM | POA: Insufficient documentation

## 2015-09-08 DIAGNOSIS — Z5189 Encounter for other specified aftercare: Secondary | ICD-10-CM

## 2015-09-08 LAB — PROTHROMBIN TIME
Prothrombin INR: 3.1 — ABNORMAL HIGH (ref 0.8–1.3)
Prothrombin Time Patient: 30.6 s — ABNORMAL HIGH (ref 10.7–15.6)

## 2015-09-08 NOTE — Progress Notes (Signed)
COURTESY PROTIME

## 2015-09-08 NOTE — Telephone Encounter (Signed)
(  TEXTING IS AN OPTION FOR [facility] CLINICS ONLY)  Is this a [facility] clinic? Yes. What is the mobile number we can use to get a hold of you via text? [phone]      RETURN CALL: Detailed message on voicemail only      SUBJECT:  Appointment Request     REASON FOR REQUEST/SYMPTOMS: INR blood draw   REFERRING PROVIDER: Valentina Lucks  REQUEST APPOINTMENT WITH: INR blood draw   REQUESTED DATE: 12/3 or soonest available  , TIME: flexible   UNABLE TO APPOINT BECAUSE: Clinic is after hours

## 2015-09-10 ENCOUNTER — Ambulatory Visit: Payer: No Typology Code available for payment source | Admitting: Pharmacist

## 2015-09-10 DIAGNOSIS — Z7901 Long term (current) use of anticoagulants: Secondary | ICD-10-CM

## 2015-09-10 DIAGNOSIS — Z952 Presence of prosthetic heart valve: Secondary | ICD-10-CM

## 2015-09-10 NOTE — Telephone Encounter (Signed)
ANTICOAGULATION TREATMENT PLAN    Indication: MVR; recurrent CVA  Goal INR: 2.5-3.5  Duration of Therapy: chronic    Hemorrhagic Risk Score: 1  Warfarin Tablet Size: 5mg     Relevant Historic Information: CVA 2008; 2005; 2006    Referring Provider: Nadene Rubins      SUBJECTIVE:   [name] was last evaluated by [facility] on 08/13/15.   His INR was 3.8 and he was instructed to take warfarin 5mg  x 1 on 08/13/15, then resume 7.5mg  Mon/Thurs, 5mg  all other days (40mg /wk) with followup in 11 days. However, Mr. Cabreros did not return for an INR until 09/08/15, about 2 weeks late.    Today, I spoke to Mr. Kreger and his Kristian Covey, and they report no s/sx of bleeding or bruising. No s/sx of CVA/TIA. His diet is the same. His activity level is consistent. There have been no acute illnesses or changes to overall health. No medication changes and no upcoming procedures.   He may have a dental procedure coming up, and [name] inquires about prophylactic antibiotic use. I advised that it would be up to either the dentist or his cardiologist re: what medication and dose to use if any. Advised her to reach out to them.      OBJECTIVE:     Current  dose:   Warfarin 5mg  on 08/13/15, then resumed 7.5mg  Mon/Thurs, 5mg  all other days (40mg /wk). No errors reported.    Relevant medication changes:   none       LABS:   INR      3.1   09/08/2015  INR      3.8   08/10/2015  INR      3.2   07/30/2015  INR      2.9   07/21/2015  INR      3.0   07/13/2015      ASSESSMENT:   INR is back within therapeutic goal range today. Will have Mr. Shellman continue this dose and monitor closely. [name] requests we also email plan to his sister, [name], from now on (in addition to [place]).    PLAN:   1. Continue warfarin 7.5mg  Mon/Thurs, 5mg  all other days (40mg /wk)   2. Return in 2 weeks (on 09/26/2015).   3. [name] acknowledged understanding of this plan. Plan emailed to [place].     Dianna Limbo, PHARMD

## 2015-09-11 NOTE — Telephone Encounter (Signed)
appt completed

## 2015-09-26 ENCOUNTER — Other Ambulatory Visit (HOSPITAL_BASED_OUTPATIENT_CLINIC_OR_DEPARTMENT_OTHER): Payer: No Typology Code available for payment source

## 2015-09-26 ENCOUNTER — Ambulatory Visit
Admit: 2015-09-26 | Discharge: 2015-09-26 | Disposition: A | Discharge status: Home / Self Care | Payer: No Typology Code available for payment source | Attending: Pharmacist | Admitting: Pharmacist

## 2015-09-26 ENCOUNTER — Other Ambulatory Visit (INDEPENDENT_AMBULATORY_CARE_PROVIDER_SITE_OTHER): Payer: No Typology Code available for payment source

## 2015-09-26 ENCOUNTER — Telehealth (HOSPITAL_BASED_OUTPATIENT_CLINIC_OR_DEPARTMENT_OTHER): Payer: Self-pay

## 2015-09-26 DIAGNOSIS — Z952 Presence of prosthetic heart valve: Secondary | ICD-10-CM

## 2015-09-26 DIAGNOSIS — Z5189 Encounter for other specified aftercare: Secondary | ICD-10-CM

## 2015-09-26 DIAGNOSIS — Z7901 Long term (current) use of anticoagulants: Secondary | ICD-10-CM

## 2015-09-26 DIAGNOSIS — Z8673 Personal history of transient ischemic attack (TIA), and cerebral infarction without residual deficits: Secondary | ICD-10-CM

## 2015-09-26 LAB — PROTHROMBIN TIME
Prothrombin INR: 3.2 — ABNORMAL HIGH (ref 0.8–1.3)
Prothrombin Time Patient: 31.6 s — ABNORMAL HIGH (ref 10.7–15.6)

## 2015-09-26 NOTE — Progress Notes (Signed)
Courtesy blood draw for Dr.  Encarnacion Chu at [facility].    Tests drawn:  PROTIME

## 2015-09-27 ENCOUNTER — Ambulatory Visit: Payer: No Typology Code available for payment source | Admitting: Pharmacist

## 2015-09-27 DIAGNOSIS — Z952 Presence of prosthetic heart valve: Secondary | ICD-10-CM

## 2015-09-27 DIAGNOSIS — Z7901 Long term (current) use of anticoagulants: Secondary | ICD-10-CM

## 2015-09-27 NOTE — Telephone Encounter (Signed)
ANTICOAGULATION TREATMENT PLAN    Indication: MVR; recurrent CVA  Goal INR: 2.5-3.5  Duration of Therapy: chronic    Hemorrhagic Risk Score: 1  Warfarin Tablet Size: 5mg     Relevant Historic Information: CVA 2008; 2005; 2006    Referring Provider: Nadene Rubins    SUBJECTIVE:   Walter Rice was last evaluated by [facility] on 07/21/2015. His INR was 3.1 and he was instructed to continue his current warfarin dose and follow up with ACC in 2 weeks.      Today, I spoke with patient's girlfriend Walter Rice regarding INR result and follow up.     Patient has not experienced any signs/sx of bleeding or unusual bruising. No signs/sx of TIA/CVA. No acute illnesses or overall changes in health.    No changes in diet or activity level since last ACC visit. Denies changes in Rx/OTCs/herbal medications since previous ACC visit.    OBJECTIVE:     Current  dose:   Warfarin 7.5mg  on Mondays and Thursdays and 5mg  on all other days of the week (40mg /wk). No errors reported.    Relevant medication changes:   none      LABS:   INR      3.2   09/26/2015  INR      3.1   09/08/2015  INR      3.8   08/10/2015  INR      3.2   07/30/2015  INR      2.9   07/21/2015      ASSESSMENT:   Therapeutic INR in stable patient without warfarin related complications.    As no reported changes in diet, lifestyle, or medications since last ACC assessment, it is reasonable to continue current maintenance dose and extend frequency of INR monitoring    PLAN:   1. Continue warfarin 7.5mg  on Mondays and Thursdays and 5mg  on all other days of the week (40mg /wk)   2. Return in 4 weeks (on 10/26/2015).   3. Walter Rice acknowledged understanding of this plan. Patient's sisters were also emailed copy of plan.     Walter Bane, PHARMD

## 2015-10-18 ENCOUNTER — Telehealth (HOSPITAL_BASED_OUTPATIENT_CLINIC_OR_DEPARTMENT_OTHER): Payer: No Typology Code available for payment source | Admitting: Unknown Physician Specialty

## 2015-10-26 ENCOUNTER — Telehealth (HOSPITAL_BASED_OUTPATIENT_CLINIC_OR_DEPARTMENT_OTHER): Payer: Self-pay

## 2015-10-26 ENCOUNTER — Other Ambulatory Visit (INDEPENDENT_AMBULATORY_CARE_PROVIDER_SITE_OTHER): Payer: No Typology Code available for payment source

## 2015-10-27 ENCOUNTER — Ambulatory Visit
Admit: 2015-10-27 | Discharge: 2015-10-27 | Disposition: A | Discharge status: Home / Self Care | Payer: No Typology Code available for payment source | Attending: Pharmacist | Admitting: Pharmacist

## 2015-10-27 ENCOUNTER — Other Ambulatory Visit (INDEPENDENT_AMBULATORY_CARE_PROVIDER_SITE_OTHER): Payer: No Typology Code available for payment source

## 2015-10-27 ENCOUNTER — Other Ambulatory Visit (HOSPITAL_BASED_OUTPATIENT_CLINIC_OR_DEPARTMENT_OTHER): Payer: No Typology Code available for payment source

## 2015-10-27 DIAGNOSIS — Z952 Presence of prosthetic heart valve: Secondary | ICD-10-CM | POA: Insufficient documentation

## 2015-10-27 DIAGNOSIS — Z8673 Personal history of transient ischemic attack (TIA), and cerebral infarction without residual deficits: Secondary | ICD-10-CM | POA: Insufficient documentation

## 2015-10-27 DIAGNOSIS — Q212 Atrioventricular septal defect, unspecified as to partial or complete: Secondary | ICD-10-CM

## 2015-10-27 DIAGNOSIS — Z7901 Long term (current) use of anticoagulants: Secondary | ICD-10-CM

## 2015-10-27 LAB — PROTHROMBIN TIME
Prothrombin INR: 2.8 — ABNORMAL HIGH (ref 0.8–1.3)
Prothrombin Time Patient: 28.4 s — ABNORMAL HIGH (ref 10.7–15.6)

## 2015-10-27 NOTE — Progress Notes (Signed)
Courtesy INR

## 2015-10-29 ENCOUNTER — Ambulatory Visit: Payer: No Typology Code available for payment source | Admitting: Pharmacist

## 2015-10-29 DIAGNOSIS — Z7901 Long term (current) use of anticoagulants: Secondary | ICD-10-CM

## 2015-10-29 DIAGNOSIS — Z952 Presence of prosthetic heart valve: Secondary | ICD-10-CM

## 2015-10-29 NOTE — Telephone Encounter (Signed)
BLOOD DRAW LOCATION: [address]     04/19/2015: If cannot reach Walter Rice, can try [name] his girlfriend first at [phone]. Beverly gave verbal authorization to discuss his anticoagulation management with [name].    EMAIL [name] AND Kim (sister) with dosing instructions following every appointment: [email or web address] , [email or web address],      Kim cell [phone] or work [phone] (try FIRST); [phone]; [email or web address], [email or web address]   Ceferino @ job skills [phone] M-F   No Coumadin day of INR.    ANTICOAGULATION TREATMENT PLAN    Indication: MVR; recurrent CVA  Goal INR: 2.5-3.5  Duration of Therapy: chronic    Hemorrhagic Risk Score: 1  Warfarin Tablet Size: 5mg     Relevant Historic Information: CVA 2008; 2005; 2006    Referring Provider: Nadene Rubins    SUBJECTIVE:  [name] was last evaluated by [facility] on 09/26/2015 at which time the INR was 3.2 (goal 2.5-3.5) and no changes were made to the warfarin regimen.  He was scheduled for follow-up appt in 4 weeks.    Spoke with girlfriend, [name]. She denies any unusual bruising or bleeding, and denies signs/sxs of TIA/stroke on Reiner's behalf. He has a slight cold (URI). No fevers.    Diet: No changes reported since last ACC visit. His diet is typically low on greens (Vit K rich foods).   Alcohol: No alcohol.   Activity:  No changes reported since last ACC visit.    WARFARIN DOSE:   Warfarin 7.5mg  on Mondays and Thursdays and 5mg  on all other days of the week (40mg /wk).     No warfarin dosing errors reported.    OBJECTIVE:  Relevant medication changes: None reported.     LABS:   INR      2.8   10/27/2015  INR      3.2   09/26/2015  INR      3.1   09/08/2015  INR      3.8   08/10/2015  INR      3.2   07/30/2015  INR      2.9   07/21/2015  INR      3.0   07/13/2015  INR      4.5   01/22/2012    ASSESSMENT:   Therapeutic and stable INR on current warfarin dose without warfarin related complications.     PLAN:      1.  Continue current warfarin dose of 7.5mg  on Mondays and Thursdays and 5mg  on all other days of the week (40mg /wk).  2.  Return in 5 weeks (on 11/30/2015).  3.  Girlfriend [name]) expressed understanding of plan outlined above.    Alfred Levins, PHARMD

## 2015-11-30 ENCOUNTER — Telehealth (HOSPITAL_BASED_OUTPATIENT_CLINIC_OR_DEPARTMENT_OTHER): Payer: Self-pay

## 2015-12-03 ENCOUNTER — Other Ambulatory Visit (HOSPITAL_BASED_OUTPATIENT_CLINIC_OR_DEPARTMENT_OTHER): Payer: No Typology Code available for payment source

## 2015-12-03 ENCOUNTER — Other Ambulatory Visit (INDEPENDENT_AMBULATORY_CARE_PROVIDER_SITE_OTHER): Payer: No Typology Code available for payment source

## 2015-12-03 ENCOUNTER — Other Ambulatory Visit
Admit: 2015-12-03 | Discharge: 2015-12-03 | Disposition: A | Discharge status: Home / Self Care | Payer: No Typology Code available for payment source | Attending: Pharmacist | Admitting: Pharmacist

## 2015-12-03 DIAGNOSIS — Z5189 Encounter for other specified aftercare: Secondary | ICD-10-CM

## 2015-12-03 DIAGNOSIS — Z952 Presence of prosthetic heart valve: Secondary | ICD-10-CM

## 2015-12-03 DIAGNOSIS — Z7901 Long term (current) use of anticoagulants: Secondary | ICD-10-CM

## 2015-12-03 DIAGNOSIS — Z8673 Personal history of transient ischemic attack (TIA), and cerebral infarction without residual deficits: Secondary | ICD-10-CM | POA: Insufficient documentation

## 2015-12-03 LAB — PROTHROMBIN TIME
Prothrombin INR: 3.6 — ABNORMAL HIGH (ref 0.8–1.3)
Prothrombin Time Patient: 35 s — ABNORMAL HIGH (ref 10.7–15.6)

## 2015-12-03 NOTE — Progress Notes (Signed)
Courtesy

## 2015-12-04 ENCOUNTER — Ambulatory Visit: Payer: No Typology Code available for payment source | Admitting: Pharmacist

## 2015-12-04 DIAGNOSIS — Z7901 Long term (current) use of anticoagulants: Secondary | ICD-10-CM

## 2015-12-04 DIAGNOSIS — Z952 Presence of prosthetic heart valve: Secondary | ICD-10-CM

## 2015-12-04 NOTE — Telephone Encounter (Signed)
ANTICOAGULATION TREATMENT PLAN    Indication: MVR; recurrent CVA  Goal INR: 2.5-3.5  Duration of Therapy: chronic    Hemorrhagic Risk Score: 1  Warfarin Tablet Size: 5mg     Relevant Historic Information: CVA 2008; 2005; 2006    Referring Provider: Nadene Rubins      SUBJECTIVE:   Walter Rice was last evaluated by [facility] on 10/29/15.   His INR was 2.8 and he was instructed to continue 7.5mg  Mon/Thur and 5mg  all other days (40mg /wk) with followup in 5 weeks (11/30/15)    Today, I spoke to Walter Rice, and he reports no s/sx of bleeding or unusual bruising.  Denies melena or hematuria. No s/sx of stroke/TIA. His diet is consistent in regards to vitamin K.  Denies EtOH intake. His activity level is the same. There have been no acute illnesses or changes to his health. He has been taking Tylenol 1000mg  at bedtime for headache for 2 days. No upcoming procedures.    OBJECTIVE:     Current  dose: Warfarin 7.5mg  Mon/Thur, 5mg  all other days (40mg /wk). No errors reported.    Relevant medication changes: Tylenol 1000mg  at bedtime for 2 days        LABS:   INR      3.6   12/03/2015  INR      2.8   10/27/2015  INR      3.2   09/26/2015  INR      3.1   09/08/2015  INR      3.8   08/10/2015    ASSESSMENT:   Slightly-therapeutic INR with no clear etiology. Patient reports no changes during ACC assessment. Will have patient take one reduced warfarin dose today.     PLAN:   1. Take warfarin 2.5mg  x1 tonight (instead of 5mg , 12/04/15) then resume 7.5mg  Mon/Thur and 5mg  all other days (40mg /wk)  2. Return in 2 weeks (on 12/17/2015).   3. Walter Rice acknowledged understanding of this plan    Osa Craver, PharmD

## 2015-12-17 ENCOUNTER — Telehealth (HOSPITAL_BASED_OUTPATIENT_CLINIC_OR_DEPARTMENT_OTHER): Payer: Self-pay

## 2015-12-18 ENCOUNTER — Telehealth (HOSPITAL_BASED_OUTPATIENT_CLINIC_OR_DEPARTMENT_OTHER): Payer: Self-pay

## 2015-12-20 ENCOUNTER — Encounter (HOSPITAL_BASED_OUTPATIENT_CLINIC_OR_DEPARTMENT_OTHER): Payer: Self-pay

## 2015-12-20 NOTE — Progress Notes (Signed)
Walter Rice is 3 days overdue for lab testing related to anticoagulant therapy. Left reminder message by telephone and mailed reminder letter today.

## 2015-12-24 ENCOUNTER — Telehealth (HOSPITAL_BASED_OUTPATIENT_CLINIC_OR_DEPARTMENT_OTHER): Payer: Self-pay

## 2015-12-24 NOTE — Telephone Encounter (Signed)
Walter Rice is 1 week overdue for lab testing related to anticoagulant therapy.  Left reminder message by telephone.

## 2015-12-28 ENCOUNTER — Ambulatory Visit
Admit: 2015-12-28 | Discharge: 2015-12-28 | Disposition: A | Discharge status: Home / Self Care | Payer: No Typology Code available for payment source | Attending: Pharmacist | Admitting: Pharmacist

## 2015-12-28 ENCOUNTER — Other Ambulatory Visit (INDEPENDENT_AMBULATORY_CARE_PROVIDER_SITE_OTHER): Payer: No Typology Code available for payment source

## 2015-12-28 ENCOUNTER — Other Ambulatory Visit (HOSPITAL_BASED_OUTPATIENT_CLINIC_OR_DEPARTMENT_OTHER): Payer: No Typology Code available for payment source

## 2015-12-28 DIAGNOSIS — Z952 Presence of prosthetic heart valve: Secondary | ICD-10-CM | POA: Insufficient documentation

## 2015-12-28 DIAGNOSIS — Z7901 Long term (current) use of anticoagulants: Secondary | ICD-10-CM

## 2015-12-28 DIAGNOSIS — Z8673 Personal history of transient ischemic attack (TIA), and cerebral infarction without residual deficits: Secondary | ICD-10-CM

## 2015-12-28 NOTE — Progress Notes (Signed)
COURTESY

## 2015-12-29 LAB — PROTHROMBIN TIME
Prothrombin INR: 3.6 — ABNORMAL HIGH (ref 0.8–1.3)
Prothrombin Time Patient: 35.3 s — ABNORMAL HIGH (ref 10.7–15.6)

## 2015-12-31 ENCOUNTER — Telehealth (HOSPITAL_BASED_OUTPATIENT_CLINIC_OR_DEPARTMENT_OTHER): Payer: Self-pay

## 2016-01-01 ENCOUNTER — Ambulatory Visit: Payer: No Typology Code available for payment source | Admitting: Pharmacist

## 2016-01-01 DIAGNOSIS — Z952 Presence of prosthetic heart valve: Secondary | ICD-10-CM

## 2016-01-01 DIAGNOSIS — Z7901 Long term (current) use of anticoagulants: Secondary | ICD-10-CM

## 2016-01-01 NOTE — Telephone Encounter (Signed)
ANTICOAGULATION TREATMENT PLAN    Indication: MVR; recurrent CVA  Goal INR: 2.5-3.5  Duration of Therapy: chronic    Hemorrhagic Risk Score: 1  Warfarin Tablet Size: 5mg     Relevant Historic Information: CVA 2008; 2005; 2006    Referring Provider: Nadene Rubins    SUBJECTIVE:   Walter Rice was last evaluated by [facility] on 2/28.  His INR was 3.6 and he was instructed to take a lower dose of warfarin x1 day then resume usual dosing.    Today I was able to reach [place] to discuss yesterday's INR result.  He has had no unusual bleeding or bruising since last visit.  No recent acute illnesses.  No changes in diet or activity level.  He denies symptoms of CVA/TIA.  He takes occasional APAP for headache but no more than 1.5gm/day.  No alcohol since last visit.      OBJECTIVE:     Current dose:    Warfarin 7.5mg  Mon, Thurs & 5mg  other days (40mg /wk). No dosing errors.     Relevant medication changes since last visit:    No medication changes or antibiotics since last visit.       LABS:  INR      3.6   12/28/2015  INR      3.6   12/03/2015  INR      2.8   10/27/2015  INR      3.2   09/26/2015  INR      3.1   09/08/2015    ASSESSMENT:   INR remains slightly above target range 2.5-3.5 with apparent complications.  Will make a small reduction in warfarin dose given that  this is the second consecutive INR above 3.5.    PLAN:   1. DECREASE warfarin to 7.5mg  Mon & 5mg  other days (37.5mg /wk)   2. Return in 2 weeks (on 01/17/2016) to coincide with [facility] Cardiology appt   3. [place] expresses understanding and is in agreement with this plan.  4. Patient's fiance and sister were emailed with above plan per [place]'s request    Georgette Dover, PharmD

## 2016-01-17 ENCOUNTER — Encounter (HOSPITAL_BASED_OUTPATIENT_CLINIC_OR_DEPARTMENT_OTHER): Payer: No Typology Code available for payment source | Admitting: Pediatrics

## 2016-01-17 ENCOUNTER — Telehealth (HOSPITAL_BASED_OUTPATIENT_CLINIC_OR_DEPARTMENT_OTHER): Payer: Self-pay

## 2016-01-18 ENCOUNTER — Telehealth (HOSPITAL_BASED_OUTPATIENT_CLINIC_OR_DEPARTMENT_OTHER): Payer: Self-pay

## 2016-01-23 ENCOUNTER — Encounter (HOSPITAL_BASED_OUTPATIENT_CLINIC_OR_DEPARTMENT_OTHER): Payer: Self-pay

## 2016-01-23 NOTE — Progress Notes (Signed)
Walter Rice is 3 days overdue for lab testing related to anticoagulant therapy. Left reminder message by telephone and mailed reminder letter today.

## 2016-01-24 ENCOUNTER — Encounter (HOSPITAL_BASED_OUTPATIENT_CLINIC_OR_DEPARTMENT_OTHER): Payer: Self-pay | Admitting: Pediatrics

## 2016-01-24 ENCOUNTER — Ambulatory Visit: Payer: No Typology Code available for payment source | Attending: Pediatrics | Admitting: Pediatrics

## 2016-01-24 VITALS — BP 116/73 | HR 70 | Ht 72.0 in | Wt 173.0 lb

## 2016-01-24 DIAGNOSIS — I495 Sick sinus syndrome: Secondary | ICD-10-CM | POA: Insufficient documentation

## 2016-01-24 NOTE — Progress Notes (Signed)
[facility] Adult Congenital EP Clinic  Visit date: 01/24/2016    Primary Care Physician: [facility])       Chief Complaint  Arrhythmia and pacemaker follow-up in the setting of repaired congenital heart disease   Sick sinus syndrome   History of palpitations      Patient Active Problem List    Diagnosis Date Noted   . Low back pain [M54.5] 08/04/2014   . Weakness of trunk musculature [M62.81] 08/04/2014   . Hamstring tightness of both lower extremities [M62.9] 08/04/2014   . Midline low back pain without sciatica [M54.5] 08/04/2014   . 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     A) Status post repair with ventricular septal defect closure in February 1989.   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 Jude prosthetic valve in 1993.       . 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       . 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  TX FROM [place]     . Headache [R51]      TX FROM [place]     . Mitral valve replaced [Z95.2] 04/13/2013   . Chronic anticoagulation [Z79.01] 02/07/2013     [place] ACC ENROLLED     . CVA (cerebral vascular accident) (HCC) [I63.9] 09/10/2011   . 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 He returns for pacemaker follow-up today.  I last saw him in April of 2016. In the interim he had a regular follow-up visit with Dr. Julio Alm in October and things were going well overall. He reports  recently that his energy level was lower than usual.       Current Outpatient Prescriptions   Medication Sig Dispense Refill   . Acetaminophen 500 MG Oral Tab Take 2 tablet by mouth every 8 hours if needed to relieve pain 60 Tab prn   . Cyclobenzaprine HCl 5 MG Oral Tab Take 1-2 tabs as needed for muscle spasms of back. Start by taking at night, can increase to three times per day. 60 tablet 3   . Warfarin Sodium 5 MG Oral Tab 7.5mg  Mon & 5mg  other days or per [facility]       No current facility-administered medications for this visit.        Review of patient's allergies indicates:  Allergies   Allergen Reactions   . Sulfa Antibiotics Rash       Review of Systems:   All other systems were reviewed and are negative except as mentioned above    Physical Exam:  BP 116/73 mmHg  Pulse 70  Ht 6' (1.829 m)  Wt 173 lb (78.472 kg)  BMI 23.46 kg/m2  SpO2 99%    General: Awake alert comfortable nontoxic in no distress  Skin: Well-healed left sided pacemaker scar.     Studies reviewed:   Echocardiogram from 07/17/2015: Mild to moderate left ventricular enlargement with normal  function albeit with inferior hypokinesis. No residual  ventricular septal defect post-patching. Mitral valve prosthesis with mean gradient 7 mmHg and although the degree  of regurgiation is difficult to determine, it is not severe. Right heart pacer wires are noted with at least moderate  tricupsid regurgitation and normal pulmonary pressures. Normal sized right ventricle with moderate concentric  hypertrophy and preserved function. Aortic valve sclerosis with mild to moderate regurgitation.    Device Interrogation:   His device tripped ERI on February 18th. The device is now in a VVI mode and we are unable to assess the atrial lead. He appears to be pacemaker dependent with no escape down to a paced rate of 40 beats per minute  CIED 06/30/2013 03/09/2014 07/06/2014 01/11/2015 07/18/2015 01/24/2016   Visit Type - - - - - Clinic   Recall - - - - - No    Pacing Mode AAIR<=>DDDR 60-180 AAIR<=>DDDR 60-180 AAIR 60-180 AAIR<=>DDDR 60-180 DDDR/AAIR 60-180 VVI 65   Type PPM PPM PPM PPM PPM PPM   Pacemaker Dependent - - - - - No   Manufacturer Medtronic Medtronic Medtronic Medtronic Medtronic Medtronic   Elective Replacement Indicator - - - 18 months 2.59 ERI   Generator Model Adapta L Adapta L Adapta L Adapta L Adapta L Adapta L   Date of Generator Implant 10/28/2006 10/28/2006 10/28/2006 10/28/2006 10/28/2006 10/28/2006   Generator Serial Number [id number]   Lead #1 Type Atrial Atrial Atrial Atrial Atrial Atrial   Model # [id number] 4068 4068 4068 4068   Lead #1 Serial # [id number] V [id number] V [id number] V [id number]   Lead #1 Implant Date 11/17/1997 11/17/1997 11/17/1997 11/17/1997 11/17/1997 11/17/1997   Lead #2 Type RV RV RV RV RV RV   Lead #2 Model # [id number] [id number] [id number]   Lead #2 Serial # [id number] V [id number] V [id number] V [id number] V [id number] V [id number]   Lead #2 Implant Date 11/17/1997 11/17/1997 11/17/1997 11/17/1997 11/17/1997 11/17/1997   Lead # 4 Serial # - - - - remote -   Date of Service - - 07/06/2014 - 07/18/2015 -   Battery Voltage 2.76V 2.74 2.72 2.73 2.71V/<3 months 2.68   P-Wave 0 0 2.0 0 - -   R-Wave 11.2 0 11.2 15.6-22.4 11.2 0   Atrial impedence 377 309 285 264 232 -   RV impedence 1495 1429 1459 1558 1608 1272   Atrial Threshold 1.5V@0 .52 1.5V@0 .52 1.25@.76 1.25V@.76 2.5@0 .4 -   RV Threshold 1V@0 .4 1V@0 .4 1.0@0 .4 1V@0 .4 1.125@0 .4 .75V@1 .0   Atrial Events 1 MS 1 MS - 0 0 -   Ventricular Events - 0 109 6 0 25 VHR   % atrial pacing 100 100 99.1 99.6 99.2 -   % ventricular pacing 0.3 0.3 0.1 0 1.4 5.7   Underlying Rhythm - CHB junctional 40 - n/a -       Impression:  Walter Rice is a 30 year old male with a dual-chamber Medtronic pacemaker for transient heart block which is resolved and ongoing sinus node dysfunction. His device has tripped ERI. I suspect that his fatigue is related to his current VVI  pacing mode. His leads are now 30 years of age. At this point, I believe the ventricular lead is adequate. I am hoping the atrial lead will be as well. Given the age of the leads however, I would like to know if the vessel is open. In the event that we need to replace a lead, I would prefer not to subject him to  the risk of a lead extraction unless necessary. I spoke with him about various options if the vessel is occluded and we need to replace a lead. We could extract a lead with the attendant risk. We will need to make this decision in advance of the procedure, because the set-up for a lead extraction is much more extensive than that involved in a generator change +/- lead revision. I reviewed his OP note from 2008. It appears that it is a sub-pectoral device. His chest xray shows 2 leads and the can in good position.       Recommendations:  1. Doppler US of left subclavian vein to establish patency  2. If patent, plan generator change + / - lead revision depending on how leads check out at the time of the procedure    I spent a total time of over 30 minutes face-to-face with the patient, of which more than 50% of that time was spent in care coordination and counseling as outlined in this note.

## 2016-01-25 ENCOUNTER — Ambulatory Visit (HOSPITAL_BASED_OUTPATIENT_CLINIC_OR_DEPARTMENT_OTHER): Payer: No Typology Code available for payment source | Admitting: Pharmacist

## 2016-01-25 ENCOUNTER — Other Ambulatory Visit (HOSPITAL_BASED_OUTPATIENT_CLINIC_OR_DEPARTMENT_OTHER): Payer: Self-pay

## 2016-01-25 ENCOUNTER — Ambulatory Visit: Payer: No Typology Code available for payment source | Attending: Pediatrics

## 2016-01-25 ENCOUNTER — Other Ambulatory Visit: Payer: Self-pay | Admitting: Pediatrics

## 2016-01-25 ENCOUNTER — Telehealth (HOSPITAL_BASED_OUTPATIENT_CLINIC_OR_DEPARTMENT_OTHER): Payer: Self-pay | Admitting: Pediatrics

## 2016-01-25 DIAGNOSIS — Z7901 Long term (current) use of anticoagulants: Secondary | ICD-10-CM

## 2016-01-25 DIAGNOSIS — Z8673 Personal history of transient ischemic attack (TIA), and cerebral infarction without residual deficits: Secondary | ICD-10-CM | POA: Insufficient documentation

## 2016-01-25 DIAGNOSIS — Z01818 Encounter for other preprocedural examination: Secondary | ICD-10-CM | POA: Insufficient documentation

## 2016-01-25 DIAGNOSIS — Z952 Presence of prosthetic heart valve: Secondary | ICD-10-CM | POA: Insufficient documentation

## 2016-01-25 LAB — PROTHROMBIN TIME
Prothrombin INR: 2.9 — ABNORMAL HIGH (ref 0.8–1.3)
Prothrombin Time Patient: 29.7 s — ABNORMAL HIGH (ref 10.7–15.6)

## 2016-01-25 MED ORDER — WARFARIN SODIUM 5 MG OR TABS
ORAL_TABLET | ORAL | Status: DC
Start: 2016-01-25 — End: 2016-08-21

## 2016-01-25 NOTE — Telephone Encounter (Signed)
ANTICOAGULATION TREATMENT PLAN    Indication: MVR; recurrent CVA  Goal INR: 2.5-3.5  Duration of Therapy: chronic    Hemorrhagic Risk Score: 1  Warfarin Tablet Size: 5mg     Relevant Historic Information: CVA 2008; 2005; 2006    Referring Provider: Nadene Rubins      SUBJECTIVE:   Walter Rice was last evaluated by [facility] on 01/01/16.   His INR was 3.6 and he was instructed to decrease warfarin dose from 7.5mg  Mon/Thurs, 5mg  all other days (40mg /wk) to 7.5mg  Mon, 5mg  all other days (37.5mg /wk) with followup in 2 weeks. He is 1 week overdue.    Today, I spoke to Walter Rice, and she reports no s/sx of bleeding or bruising. No s/sx of CVA/TIA. His diet is mostly consistent. He has been eating more salads, but these are lettuce based (do not contain dark green leafy vegetables). His activity level is consistent. There have been no acute illnesses or changes to overall health. No medication changes and no upcoming procedures, although he may need a battery and lead change in his pacemaker this next month. Walter Rice will hear from his cardiologist next week and let us know of the date of procedure.     OBJECTIVE:     Current  dose:   Warfarin 7.5mg  Mon, 5mg  all other days (37.5mg /wk). No errors reported.    Relevant medication changes:   none       LABS:   INR      2.9   01/25/2016  INR      3.6   12/28/2015  INR      3.6   12/03/2015  INR      2.8   10/27/2015  INR      3.2   09/26/2015      ASSESSMENT:   INR within therapeutic goal range following a reduction in dose 3 weeks ago. Walter Rice will alert ACC when the procedure for Mr. Walter Rice' pacemaker is scheduled, so we can reach out to see what requirements will be needed during this time.    PLAN:   1. Continue warfarin 7.5mg  Mon, 5mg  all other days  (37.5mg /wk)  2. Return in 2 weeks (on 02/08/2016).   3. Walter Rice acknowledged understanding of this plan. Plan emailed to Walter Rice and Walter Rice (sister).    Dianna Limbo, PharmD

## 2016-01-25 NOTE — Telephone Encounter (Signed)
[name], Ryken's SO called wanting to know when change-out procedure will be scheduled for ERI pacer.  Wavie got the doppler ulstrasound today that was requested by Dr. Suella Broad.  I told her that we would call them next week once Dr. Suella Broad has been able to review the test results and make a plan.  If possible, [name] requests she be called on her cell at [phone] to make arrangements.

## 2016-01-29 ENCOUNTER — Telehealth (HOSPITAL_BASED_OUTPATIENT_CLINIC_OR_DEPARTMENT_OTHER): Payer: Self-pay | Admitting: Pediatrics

## 2016-01-29 NOTE — Telephone Encounter (Signed)
(  TEXTING IS AN OPTION FOR [facility] CLINICS ONLY)  Is this a [facility] clinic? No      RETURN CALL: Detailed message on voicemail only      SUBJECT:  General Message     REASON FOR REQUEST: Surgery appointment options    MESSAGE: Patients fiancee calling into discuss options for surgery. Please contact to discuss.  Thank you.

## 2016-01-29 NOTE — Telephone Encounter (Signed)
Left message or patient to call.

## 2016-01-29 NOTE — Telephone Encounter (Signed)
Patient is partner called stating that she is returning a call back from [place].

## 2016-01-30 NOTE — Telephone Encounter (Signed)
Called patiet to discuss plan.  Dr Suella Broad needs to review ultrasound and I will clal patient back tomorrow(Thursday) with plan.  Patient would like me to call Kelsy at [phone]

## 2016-01-30 NOTE — Telephone Encounter (Signed)
Left message

## 2016-01-31 NOTE — Telephone Encounter (Signed)
Dr Suella Broad wants to discuss plan with patient and fiance [name].  Have left several messages.  Emailed phone numbers to Dr Suella Broad to call.

## 2016-02-04 ENCOUNTER — Telehealth (HOSPITAL_BASED_OUTPATIENT_CLINIC_OR_DEPARTMENT_OTHER): Payer: Self-pay | Admitting: Pediatrics

## 2016-02-04 ENCOUNTER — Encounter (HOSPITAL_BASED_OUTPATIENT_CLINIC_OR_DEPARTMENT_OTHER): Payer: Self-pay | Admitting: Pharmacist

## 2016-02-04 NOTE — Telephone Encounter (Signed)
Called [name] and discussed procedure instructions.  Patient seen in EP clinic.  Scheduled and discussed gen change possible atrial lead with new system on right side procedure.    Informed patient to stop taking the following medications:  No medications to hold.  Patient to contact [facility] clinic and made them aware that he is having a procedure and INR goal is around 2.5 with no heparin bridge.    Informed patient to stay NPO starting Midnight before the procedure.    Asked patient to arrive by 0630 o'clock on 02-08-16.  Patient to arrive in:  1. Admitting Office  2.Cardiovascular Procedure Suite 2nd floor near the [address] [place] Elevators  3.Pick up the phone on wall on the right, it will ring to the ICR[facility] and they will come get you.    Other instructions given to patient:  Patient to have an assessment by anesthesia prior to this procedure PHA completed and faxed.  To plan to stay the overnight.   Patient is aware that he/she will need to arrange a ride home and will not be allowed to drive home or travel home alone by bus or taxi due to the effects of anesthesia used during the procedure.  To call our office with any questions or concerns please call [phone] Terri.  Patient was provided with a bottle of CHG and instructed to wash chest the night before and the morning of procedure.        _

## 2016-02-04 NOTE — Progress Notes (Addendum)
Alerted by Joaquin Bend RN that [place] will be scheduled 02/08/16 for:     Generator change with possible atrial lead with new system on right side procedure    INR goal around 2.5 with no heparin bridge.    Message left with [name] and Do[place] today to request INR 02/06/16 in order to target INR close to 2.5 day of procedure.    [name] reported back to [facility] she will be able to take [place] to lab 02/05/16 pm.

## 2016-02-05 ENCOUNTER — Other Ambulatory Visit (HOSPITAL_BASED_OUTPATIENT_CLINIC_OR_DEPARTMENT_OTHER): Payer: No Typology Code available for payment source

## 2016-02-05 ENCOUNTER — Other Ambulatory Visit
Admit: 2016-02-05 | Discharge: 2016-02-05 | Disposition: A | Discharge status: Home / Self Care | Payer: No Typology Code available for payment source | Attending: Pharmacist | Admitting: Pharmacist

## 2016-02-05 ENCOUNTER — Other Ambulatory Visit (INDEPENDENT_AMBULATORY_CARE_PROVIDER_SITE_OTHER): Payer: No Typology Code available for payment source

## 2016-02-05 DIAGNOSIS — Z952 Presence of prosthetic heart valve: Secondary | ICD-10-CM

## 2016-02-05 DIAGNOSIS — Z8673 Personal history of transient ischemic attack (TIA), and cerebral infarction without residual deficits: Secondary | ICD-10-CM | POA: Insufficient documentation

## 2016-02-05 DIAGNOSIS — I472 Ventricular tachycardia: Secondary | ICD-10-CM

## 2016-02-05 DIAGNOSIS — Z7901 Long term (current) use of anticoagulants: Secondary | ICD-10-CM

## 2016-02-05 DIAGNOSIS — I4729 Other ventricular tachycardia: Secondary | ICD-10-CM

## 2016-02-05 NOTE — Progress Notes (Signed)
Courtesy

## 2016-02-06 ENCOUNTER — Telehealth (HOSPITAL_BASED_OUTPATIENT_CLINIC_OR_DEPARTMENT_OTHER): Payer: Self-pay

## 2016-02-06 LAB — PROTHROMBIN TIME
Prothrombin INR: 2.9 — ABNORMAL HIGH (ref 0.8–1.3)
Prothrombin Time Patient: 29.9 s — ABNORMAL HIGH (ref 10.7–15.6)

## 2016-02-07 ENCOUNTER — Ambulatory Visit: Payer: No Typology Code available for payment source | Admitting: Pharmacist

## 2016-02-07 DIAGNOSIS — Z952 Presence of prosthetic heart valve: Secondary | ICD-10-CM

## 2016-02-07 DIAGNOSIS — Z7901 Long term (current) use of anticoagulants: Secondary | ICD-10-CM

## 2016-02-07 NOTE — Telephone Encounter (Signed)
ANTICOAGULATION TREATMENT PLAN    Indication: MVR; recurrent CVA  Goal INR: 2.5-3.5  Duration of Therapy: chronic    Hemorrhagic Risk Score: 1  Warfarin Tablet Size: 5mg     Relevant Historic Information: CVA 2008; 2005; 2006    Referring Provider: Nadene Rubins    02/08/16:  Pacemaker generator change possible atrial lead with new system on right side procedure. INR goal around 2.5 with no heparin bridge.        SUBJECTIVE:   Walter Rice was last evaluated by [facility] on 01/25/16.   His INR was 2.9 and he was instructed to continue warfarin 7.5mg  Mon, 5mg  all other days (37.5mg /wk) with followup in 2 weeks. ACC was notified that Walter Rice will have a generator change on 02/08/16 with an INR goal of 2.5    Today, I spoke to Walter Rice, his fiancee, and she reports no s/sx of bleeding or bruising. No s/sx of CVA/TIA. His diet is unchanged. His activity level is consistent. There have been no acute illnesses or changes to overall health. No medication changes and no other upcoming procedures aside from the one tomorrow.    OBJECTIVE:     Current  dose:   Warfarin 7.5mg  Mon, 5mg  all other days (37.5mg /wk). No errors reported.    Relevant medication changes:   none       LABS:   INR      2.9   02/05/2016  INR      2.9   01/25/2016  INR      3.6   12/28/2015  INR      3.6   12/03/2015  INR      2.8   10/27/2015      ASSESSMENT:   Therapeutic INR on current warfarin dose in patient without warfarin related complications. In preparation for tomorrow's procedure, will have Walter Rice take half a dose (2.5mg ) today to help lower INR to goal of 2.5.     PLAN:   1. Take warfarin 2.5mg  x 1 today (02/07/16), then resume 7.5mg  Mon, 5mg  all other days (37.5mg /wk)  2. Return in 3 weeks (on 02/25/2016).   3. Walter Rice acknowledged understanding of this plan    Dianna Limbo, PharmD

## 2016-02-08 ENCOUNTER — Ambulatory Visit (HOSPITAL_BASED_OUTPATIENT_CLINIC_OR_DEPARTMENT_OTHER): Payer: No Typology Code available for payment source

## 2016-02-08 ENCOUNTER — Ambulatory Visit (HOSPITAL_BASED_OUTPATIENT_CLINIC_OR_DEPARTMENT_OTHER): Payer: No Typology Code available for payment source | Admitting: Pediatrics

## 2016-02-08 ENCOUNTER — Ambulatory Visit
Admission: RE | Admit: 2016-02-08 | Discharge: 2016-02-09 | Disposition: A | Discharge status: Home / Self Care | Payer: No Typology Code available for payment source | Attending: Pediatrics | Admitting: Pediatrics

## 2016-02-08 ENCOUNTER — Telehealth (HOSPITAL_BASED_OUTPATIENT_CLINIC_OR_DEPARTMENT_OTHER): Payer: Self-pay

## 2016-02-08 DIAGNOSIS — Z45018 Encounter for adjustment and management of other part of cardiac pacemaker: Secondary | ICD-10-CM

## 2016-02-08 DIAGNOSIS — Z9889 Other specified postprocedural states: Secondary | ICD-10-CM | POA: Insufficient documentation

## 2016-02-08 LAB — CBC (HEMOGRAM)
Hematocrit: 39 % (ref 38–50)
Hemoglobin: 13.2 g/dL (ref 13.0–18.0)
MCH: 31.1 pg (ref 27.3–33.6)
MCHC: 33.6 g/dL (ref 32.2–36.5)
MCV: 93 fL (ref 81–98)
Platelet Count: 202 10*3/uL (ref 150–400)
RBC: 4.24 10*6/uL — ABNORMAL LOW (ref 4.40–5.60)
RDW-CV: 13.1 % (ref 11.6–14.4)
WBC: 7.66 10*3/uL (ref 4.3–10.0)

## 2016-02-08 LAB — PROTHROMBIN & PTT
Partial Thromboplastin Time: 48 s — ABNORMAL HIGH (ref 22–35)
Prothrombin INR: 3.2 — ABNORMAL HIGH (ref 0.8–1.3)
Prothrombin Time Patient: 32.1 s — ABNORMAL HIGH (ref 10.7–15.6)

## 2016-02-08 LAB — BASIC METABOLIC PANEL
Anion Gap: 9 (ref 4–12)
Calcium: 9.4 mg/dL (ref 8.9–10.2)
Carbon Dioxide, Total: 28 meq/L (ref 22–32)
Chloride: 104 meq/L (ref 98–108)
Creatinine: 1.06 mg/dL (ref 0.51–1.18)
GFR, Calc, African American: >60 mL/min/{1.73_m2}
GFR, Calc, European American: >60 mL/min/{1.73_m2}
Glucose: 94 mg/dL (ref 62–125)
Potassium: 3.7 meq/L (ref 3.6–5.2)
Sodium: 141 meq/L (ref 135–145)
Urea Nitrogen: 15 mg/dL (ref 8–21)

## 2016-02-09 DIAGNOSIS — Z9889 Other specified postprocedural states: Secondary | ICD-10-CM

## 2016-02-09 LAB — PROTHROMBIN TIME
Prothrombin INR: 3.4 — ABNORMAL HIGH (ref 0.8–1.3)
Prothrombin Time Patient: 33.4 s — ABNORMAL HIGH (ref 10.7–15.6)

## 2016-02-21 ENCOUNTER — Ambulatory Visit (HOSPITAL_BASED_OUTPATIENT_CLINIC_OR_DEPARTMENT_OTHER): Payer: No Typology Code available for payment source | Admitting: Unknown Physician Specialty

## 2016-02-25 ENCOUNTER — Telehealth (HOSPITAL_BASED_OUTPATIENT_CLINIC_OR_DEPARTMENT_OTHER): Payer: Self-pay

## 2016-02-28 ENCOUNTER — Encounter (HOSPITAL_BASED_OUTPATIENT_CLINIC_OR_DEPARTMENT_OTHER): Payer: Self-pay

## 2016-02-28 NOTE — Progress Notes (Signed)
Walter Rice is 3 days overdue for lab testing related to anticoagulant therapy. Left reminder message by telephone and mailed reminder letter today.

## 2016-03-04 ENCOUNTER — Telehealth (HOSPITAL_BASED_OUTPATIENT_CLINIC_OR_DEPARTMENT_OTHER): Payer: Self-pay

## 2016-03-04 NOTE — Telephone Encounter (Signed)
Walter Rice is 1 week overdue for lab testing related to anticoagulant therapy.  Left reminder message by telephone.

## 2016-03-10 ENCOUNTER — Telehealth (HOSPITAL_BASED_OUTPATIENT_CLINIC_OR_DEPARTMENT_OTHER): Payer: Self-pay

## 2016-03-10 NOTE — Telephone Encounter (Signed)
Walter Rice is 2 weeks overdue for lab testing related to anticoagulant therapy.  Left reminder message by telephone.

## 2016-03-11 ENCOUNTER — Other Ambulatory Visit (INDEPENDENT_AMBULATORY_CARE_PROVIDER_SITE_OTHER): Payer: No Typology Code available for payment source

## 2016-03-11 ENCOUNTER — Other Ambulatory Visit (HOSPITAL_BASED_OUTPATIENT_CLINIC_OR_DEPARTMENT_OTHER): Payer: Self-pay

## 2016-03-11 ENCOUNTER — Other Ambulatory Visit
Admit: 2016-03-11 | Discharge: 2016-03-11 | Disposition: A | Discharge status: Home / Self Care | Payer: No Typology Code available for payment source | Attending: Pharmacist | Admitting: Pharmacist

## 2016-03-11 DIAGNOSIS — Z8673 Personal history of transient ischemic attack (TIA), and cerebral infarction without residual deficits: Secondary | ICD-10-CM

## 2016-03-11 DIAGNOSIS — Z7901 Long term (current) use of anticoagulants: Secondary | ICD-10-CM | POA: Insufficient documentation

## 2016-03-11 DIAGNOSIS — Z952 Presence of prosthetic heart valve: Secondary | ICD-10-CM

## 2016-03-11 LAB — PROTHROMBIN TIME
Prothrombin INR: 2.8 — ABNORMAL HIGH (ref 0.8–1.3)
Prothrombin Time Patient: 29 s — ABNORMAL HIGH (ref 10.7–15.6)

## 2016-03-11 NOTE — Progress Notes (Signed)
Courtesy draw  Orders in EPIC      Jeff Rhianna Raulerson R.T.(R)

## 2016-03-12 ENCOUNTER — Ambulatory Visit: Payer: No Typology Code available for payment source | Admitting: Pharmacist

## 2016-03-12 DIAGNOSIS — Z952 Presence of prosthetic heart valve: Secondary | ICD-10-CM

## 2016-03-12 DIAGNOSIS — Z7901 Long term (current) use of anticoagulants: Secondary | ICD-10-CM

## 2016-03-12 NOTE — Telephone Encounter (Signed)
ANTICOAGULATION TREATMENT PLAN    Indication: MVR; recurrent CVA  Goal INR: 2.5-3.5  Duration of Therapy: chronic    Hemorrhagic Risk Score: 1  Warfarin Tablet Size: 5mg     Relevant Historic Information: CVA 2008; 2005; 2006    Referring Provider: Nadene Rubins       SUBJECTIVE:   Walter Rice was last evaluated by the anticoagulation clinic on 02/07/16 and instructed to continue warfarin 7.5mg  Mon and 5mg  all other days.    Walter Rice is getting married this month.    Today, pt reports no unusual bruising/bleeding. Diet consistent. Pt took all prescribed doses. No acute illness or signs/sx of stroke noted by patient.  Activity level stable.  Green vegetable intake averages 1 serving a week.        Present dose: Warfarin 7.5mg  Mon and 5mg  all other days.    OBJECTIVE:     Relevant medication changes: None.      LABS:   Lab Results   Component Value Date    INR 2.8 03/11/2016    INR 3.4 02/09/2016    INR 3.2 02/08/2016    INR 2.9 01/25/2016    INR 3.6 12/28/2015    INR 3.6 12/03/2015         ASSESSMENT:   INR therapeutic in otherwise stable patient without any warfarin related concerns or events.    As Walter Rice reports no changes in diet, lifestyle, or medications since last ACC assessment, it is reasonable to continue current maintenance dose and continue frequency of INR monitoring.        PLAN:   1. Continue warfarin 7.5mg  Mon and 5mg  all other days (37.5mg /wk)  2. Return in 4 weeks (on 04/09/2016). INR @ [facility].  3. Walter Rice verbally expressed understanding of the above plan.  Plan e-mailed to [facility] and [name].        Dia Sitter, PharmD

## 2016-04-09 ENCOUNTER — Telehealth (HOSPITAL_BASED_OUTPATIENT_CLINIC_OR_DEPARTMENT_OTHER): Payer: Self-pay

## 2016-04-14 ENCOUNTER — Encounter (HOSPITAL_BASED_OUTPATIENT_CLINIC_OR_DEPARTMENT_OTHER): Payer: Self-pay

## 2016-04-14 NOTE — Progress Notes (Signed)
Walter Rice is 3 days overdue for lab testing related to anticoagulant therapy. Left reminder message by telephone and mailed reminder letter today.

## 2016-04-16 ENCOUNTER — Telehealth (HOSPITAL_BASED_OUTPATIENT_CLINIC_OR_DEPARTMENT_OTHER): Payer: Self-pay

## 2016-04-16 NOTE — Telephone Encounter (Signed)
Walter Rice is 1 week overdue for lab testing related to anticoagulant therapy.  Left reminder message by telephone.

## 2016-04-25 ENCOUNTER — Telehealth (HOSPITAL_BASED_OUTPATIENT_CLINIC_OR_DEPARTMENT_OTHER): Payer: Self-pay

## 2016-04-25 NOTE — Telephone Encounter (Signed)
Walter Rice is 2 weeks overdue for lab testing related to anticoagulant therapy.  Left reminder message by telephone.

## 2016-04-30 ENCOUNTER — Telehealth (HOSPITAL_BASED_OUTPATIENT_CLINIC_OR_DEPARTMENT_OTHER): Payer: Self-pay

## 2016-04-30 DIAGNOSIS — Z952 Presence of prosthetic heart valve: Secondary | ICD-10-CM

## 2016-04-30 NOTE — Telephone Encounter (Signed)
Walter Rice is 3 weeks overdue for lab testing related to anticoagulant therapy.  Left reminder message by telephone.

## 2016-05-08 ENCOUNTER — Telehealth (HOSPITAL_BASED_OUTPATIENT_CLINIC_OR_DEPARTMENT_OTHER): Payer: No Typology Code available for payment source | Admitting: Unknown Physician Specialty

## 2016-05-09 ENCOUNTER — Other Ambulatory Visit (INDEPENDENT_AMBULATORY_CARE_PROVIDER_SITE_OTHER): Payer: No Typology Code available for payment source

## 2016-05-09 ENCOUNTER — Other Ambulatory Visit
Admit: 2016-05-09 | Discharge: 2016-05-09 | Disposition: A | Discharge status: Home / Self Care | Payer: No Typology Code available for payment source | Attending: Pharmacist | Admitting: Pharmacist

## 2016-05-09 DIAGNOSIS — Z952 Presence of prosthetic heart valve: Secondary | ICD-10-CM

## 2016-05-09 DIAGNOSIS — Z5189 Encounter for other specified aftercare: Secondary | ICD-10-CM

## 2016-05-09 NOTE — Addendum Note (Signed)
Addended by: Clarene Reamer on: 05/09/2016 04:07 PM     Modules accepted: Orders

## 2016-05-10 LAB — PROTHROMBIN TIME
Prothrombin INR: 2.2 — ABNORMAL HIGH (ref 0.8–1.3)
Prothrombin Time Patient: 23.8 s — ABNORMAL HIGH (ref 10.7–15.6)

## 2016-05-12 ENCOUNTER — Ambulatory Visit: Payer: No Typology Code available for payment source | Admitting: Pharmacist

## 2016-05-12 DIAGNOSIS — Z7901 Long term (current) use of anticoagulants: Secondary | ICD-10-CM

## 2016-05-12 DIAGNOSIS — Z952 Presence of prosthetic heart valve: Secondary | ICD-10-CM

## 2016-05-12 NOTE — Telephone Encounter (Signed)
ANTICOAGULATION TREATMENT PLAN    Indication: MVR; recurrent CVA  Goal INR: 2.5-3.5  Duration of Therapy: chronic    Hemorrhagic Risk Score: 1  Warfarin Tablet Size: 5mg     Relevant Historic Information: CVA 2008; 2005; 2006    Referring Provider: Nadene Rubins    02/08/16: Pacemaker generator change INR goal ~ 2.5 with no heparin bridge.      SUBJECTIVE:   Walter Rice was last evaluated by [facility] on 03/12/16. His INR was 2.8 and he was instructed to continue his current warfarin dose and follow up with ACC in 4 weeks (04/09/16). He missed this appointment and did not have his INR checked until 05/09/16.     Today, [name] reports he has been very busy since his last ACC visit. He got married in June and unfortunately a friend of his passed away last month.     He denies any signs/symptoms of bleeding, unusual bruising, or signs/symptoms of TIA/CVA. No melena or hematuria. No acute illnesses or overall changes in health.     Diet stable with regards to vitamin k intake (1 servings of vitamin k per week).  Activity level unchanged. Denies changes in Rx/OTCs/herbal medications since previous ACC visit. No upcoming procedures.     OBJECTIVE:     Current  dose:   Warfarin 7.5mg  on Mondays and 5mg  on all other days. [place] reports he missed a warfarin dose on 05/07/16.     Relevant medication changes:   none      LABS:   Lab Results   Component Value Date    INR 2.2 05/09/2016    INR 2.8 03/11/2016    INR 3.4 02/09/2016    INR 3.2 02/08/2016    INR 2.9 02/05/2016        ASSESSMENT:   INR below goal of 2.5-3.5 due to missed warfarin dose. [name] has been otherwise stable on current maintenance dose.     PLAN:   1. Warfarin 10mg  (instead of 7.5mg ) today 05/12/16, then resume warfarin 7.5mg  on Mondays and 5mg  on all other days of the week (37.5mg wk)   2. Return in 4 weeks (on 06/06/2016).   3. Pt acknowledged understanding of this plan    Willaim Bane, PharmD

## 2016-06-06 ENCOUNTER — Telehealth (HOSPITAL_BASED_OUTPATIENT_CLINIC_OR_DEPARTMENT_OTHER): Payer: Self-pay

## 2016-06-13 ENCOUNTER — Telehealth (HOSPITAL_BASED_OUTPATIENT_CLINIC_OR_DEPARTMENT_OTHER): Payer: Self-pay

## 2016-06-14 ENCOUNTER — Other Ambulatory Visit (INDEPENDENT_AMBULATORY_CARE_PROVIDER_SITE_OTHER): Payer: No Typology Code available for payment source

## 2016-06-14 ENCOUNTER — Other Ambulatory Visit
Admit: 2016-06-14 | Discharge: 2016-06-14 | Disposition: A | Discharge status: Home / Self Care | Payer: No Typology Code available for payment source | Attending: Pharmacotherapy | Admitting: Pharmacotherapy

## 2016-06-14 DIAGNOSIS — Z952 Presence of prosthetic heart valve: Secondary | ICD-10-CM

## 2016-06-14 DIAGNOSIS — Z5189 Encounter for other specified aftercare: Secondary | ICD-10-CM

## 2016-06-14 LAB — PROTHROMBIN TIME
Prothrombin INR: 2.1 — ABNORMAL HIGH (ref 0.8–1.3)
Prothrombin Time Patient: 23 s — ABNORMAL HIGH (ref 10.7–15.6)

## 2016-06-14 NOTE — Addendum Note (Signed)
Addended by: Clarene Reamer on: 06/14/2016 04:35 PM     Modules accepted: Orders

## 2016-06-14 NOTE — Progress Notes (Signed)
Courtesy INR

## 2016-06-16 ENCOUNTER — Telehealth (HOSPITAL_BASED_OUTPATIENT_CLINIC_OR_DEPARTMENT_OTHER): Payer: No Typology Code available for payment source | Admitting: Pharmacist

## 2016-06-16 DIAGNOSIS — Z7901 Long term (current) use of anticoagulants: Secondary | ICD-10-CM

## 2016-06-16 DIAGNOSIS — Z952 Presence of prosthetic heart valve: Secondary | ICD-10-CM

## 2016-06-16 NOTE — Telephone Encounter (Signed)
ANTICOAGULATION TREATMENT PLAN    Indication: MVR; recurrent CVA  Goal INR: 2.5-3.5  Duration of Therapy: chronic    Hemorrhagic Risk Score: 1  Warfarin Tablet Size: 5mg     Relevant Historic Information: CVA 2008; 2005; 2006    Referring Provider: Nadene Rubins    02/08/16: Pacemaker generator change INR goal ~ 2.5 with no heparin bridge.       SUBJECTIVE:   Walter Rice was last evaluated by [facility] on 05/12/16. His INR was 2.2 and he was instructed to take 10mg  that day, then resume warfarin 7.5mg  Mon and 5mg  all other days.     Pt is 1 week overdue for INR evaluation and ACC follow up.    Today, Walter Rice and Walter Rice report: no unusual bruising/bleeding. Diet consistent.  He has some muscle spasms down the left arm daily since having the pacer generator changeout.  Pt took all prescribed doses. No acute illness or signs/sx of stroke noted by patient.  Activity level similar.  Green vegetable intake still the occasional salad.        Present dose: Warfarin 7.5mg  Mon and 5mg  all other days     OBJECTIVE:     Relevant medication changes: None.      LABS:   Lab Results   Component Value Date    INR 2.1 06/14/2016    INR 2.2 05/09/2016    INR 2.8 03/11/2016    INR 3.4 02/09/2016    INR 3.2 02/08/2016    INR 2.9 02/05/2016    INR 2.9 01/25/2016       ASSESSMENT:   INR below goal for a second consecutive reading without clear explanation.  Pt would benefit from a mild increase in maintenance dose to aim for mid-therapeutic INR.    Patient and spouse are in agreement with plan.    PLAN:   1. Increase to warfarin 7.5mg  Mon/Wed and 5mg  all other days (40mg /wk or a 7% increase in maintenance dose)  2. Return in 2 weeks (on 06/30/2016). INR @ [facility].  3. Walter Rice verbally expressed understanding of the above plan.        Dia Sitter, PharmD

## 2016-06-19 ENCOUNTER — Ambulatory Visit
Payer: No Typology Code available for payment source | Attending: Cardiovascular Disease | Admitting: Unknown Physician Specialty

## 2016-06-19 DIAGNOSIS — I472 Ventricular tachycardia: Secondary | ICD-10-CM | POA: Insufficient documentation

## 2016-06-19 DIAGNOSIS — I495 Sick sinus syndrome: Secondary | ICD-10-CM | POA: Insufficient documentation

## 2016-06-19 DIAGNOSIS — I4729 Other ventricular tachycardia: Secondary | ICD-10-CM

## 2016-06-19 NOTE — Progress Notes (Signed)
[facility] Note    This is a Routine Remote Pacer check. The device was placed for a history of SSS. He is being followed by Dr. Suella Broad.    The presenting rhythm is Ap Vs @ 64 bpm. He is Ap 99 % and  Vp  2 %.     The battery, lead impedance and measured data is stable.     There are 2 NS episodes in the log. The other HVR;s are Ap Vs. There are also  AF epiodes that from the markers appear as they may be some far field  over sensing .    The histograms show rate distribution from 60 - 80 bpm.    The follow up appt is a Remote check in 3 months.    Rich Number, RN    [facility]    CIED 06/30/2013 03/09/2014 07/06/2014 01/11/2015 07/18/2015 01/24/2016 06/19/2016   Visit Type - - - - - Clinic Remote   Recall - - - - - No No   Pacing Mode AAIR<=>DDDR 60-180 AAIR<=>DDDR 60-180 AAIR 60-180 AAIR<=>DDDR 60-180 DDDR/AAIR 60-180 VVI 65 DDDR/AAIR 60-180   Type PPM PPM PPM PPM PPM PPM PPM   Pacemaker Dependent - - - - - No No   Manufacturer Medtronic Medtronic Medtronic Medtronic Medtronic Medtronic Medtronic   Elective Replacement Indicator - - - 18 months 2.59 ERI -   Generator Model Adapta L Adapta L Adapta L Adapta L Adapta L Adapta L Adapta L   Date of Generator Implant 10/28/2006 10/28/2006 10/28/2006 10/28/2006 10/28/2006 10/28/2006 10/28/2006   Generator Serial Number [id number]   Lead #1 Type Atrial Atrial Atrial Atrial Atrial Atrial Atrial   Model # [id number] 4068 4068 4068 [id number] [id number]   Lead #1 Serial # [id number] V [id number] V [id number] V [id number] V [id number] V [id number] V [id number] V   Lead #1 Implant Date 11/17/1997 11/17/1997 11/17/1997 11/17/1997 11/17/1997 11/17/1997 11/17/1997   Lead #2 Type RV RV RV RV RV RV RV   Lead #2 Model # [id number] [id number] 5054 5054 [id number]   Lead #2 Serial # [id number] V [id number] V [id number] V [id number] V [id number] V [id number] V [id number] V   Lead #2 Implant Date 11/17/1997 11/17/1997 11/17/1997 11/17/1997 11/17/1997 11/17/1997 11/17/1997   Lead # 4  Serial # - - - - remote - -   Date of Service - - 07/06/2014 - 07/18/2015 - 06/19/2016   Battery Voltage 2.76V 2.74 2.72 2.73 2.71V/<3 months 2.68 2.79V / 10.5 Years   P-Wave 0 0 2.0 0 - - -   R-Wave 11.2 0 11.2 15.6-22.4 11.2 0 11.2   Atrial impedence 377 309 285 264 232 - 291   RV impedence 1495 1429 1459 1558 1608 1272 1584   Atrial Threshold 1.5V@0 .52 1.5V@0 .52 1.25@.76 1.25V@.76 2.5@0 .4 - 1.25@0 .4   RV Threshold 1V@0 .4 1V@0 .4 1.0@0 .4 1V@0 .4 1.125@0 .4 .75V@1 .0 1.125@0 .4   Atrial Events 1 MS 1 MS - 0 0 - None   Ventricular Events - 0 109 6 0 25 VHR 2 NS   % atrial pacing 100 100 99.1 99.6 99.2 - 99   % ventricular pacing 0.3 0.3 0.1 0 1.4 5.7 2   Underlying Rhythm - CHB junctional 40 - n/a - NA

## 2016-06-30 ENCOUNTER — Telehealth (HOSPITAL_BASED_OUTPATIENT_CLINIC_OR_DEPARTMENT_OTHER): Payer: Self-pay

## 2016-07-02 DIAGNOSIS — Z45018 Encounter for adjustment and management of other part of cardiac pacemaker: Secondary | ICD-10-CM | POA: Insufficient documentation

## 2016-07-02 NOTE — Progress Notes (Signed)
EP ATTENDING ADDENDUM:    I have reviewed the device interrogation/programming report.  Lead and device measurements are stable.                Rare NSVT and rare oversensing.  No changes needed at this time.                          Lorrene Reid, MD [facility]  Cardiac Electrophysiology Section  Cardiology Division

## 2016-07-03 ENCOUNTER — Encounter (HOSPITAL_BASED_OUTPATIENT_CLINIC_OR_DEPARTMENT_OTHER): Payer: Self-pay

## 2016-07-03 NOTE — Progress Notes (Signed)
Walter Rice is 3 days overdue for lab testing related to anticoagulant therapy. Left reminder message by telephone and mailed reminder letter today.

## 2016-07-07 ENCOUNTER — Other Ambulatory Visit (HOSPITAL_BASED_OUTPATIENT_CLINIC_OR_DEPARTMENT_OTHER): Payer: Self-pay | Admitting: Pharmacist

## 2016-07-07 ENCOUNTER — Telehealth (HOSPITAL_BASED_OUTPATIENT_CLINIC_OR_DEPARTMENT_OTHER): Payer: Self-pay

## 2016-07-07 ENCOUNTER — Other Ambulatory Visit
Admit: 2016-07-07 | Discharge: 2016-07-07 | Disposition: A | Discharge status: Home / Self Care | Payer: No Typology Code available for payment source | Attending: Pharmacist | Admitting: Pharmacist

## 2016-07-07 ENCOUNTER — Other Ambulatory Visit (INDEPENDENT_AMBULATORY_CARE_PROVIDER_SITE_OTHER): Payer: No Typology Code available for payment source

## 2016-07-07 DIAGNOSIS — Z952 Presence of prosthetic heart valve: Secondary | ICD-10-CM

## 2016-07-07 DIAGNOSIS — Z7901 Long term (current) use of anticoagulants: Secondary | ICD-10-CM

## 2016-07-07 DIAGNOSIS — Z5181 Encounter for therapeutic drug level monitoring: Secondary | ICD-10-CM

## 2016-07-07 NOTE — Telephone Encounter (Signed)
Walter Rice is 1 week overdue for lab testing related to anticoagulant therapy.  Left reminder message by telephone.

## 2016-07-07 NOTE — Progress Notes (Signed)
Courtesy

## 2016-07-08 ENCOUNTER — Telehealth (HOSPITAL_BASED_OUTPATIENT_CLINIC_OR_DEPARTMENT_OTHER): Payer: No Typology Code available for payment source | Admitting: Pharmacist

## 2016-07-08 DIAGNOSIS — Z7901 Long term (current) use of anticoagulants: Secondary | ICD-10-CM

## 2016-07-08 DIAGNOSIS — Z952 Presence of prosthetic heart valve: Secondary | ICD-10-CM

## 2016-07-08 LAB — PROTHROMBIN TIME
Prothrombin INR: 2.3 — ABNORMAL HIGH (ref 0.8–1.3)
Prothrombin Time Patient: 24.8 s — ABNORMAL HIGH (ref 10.7–15.6)

## 2016-07-08 NOTE — Telephone Encounter (Signed)
ANTICOAGULATION TREATMENT PLAN    Indication: MVR; recurrent CVA  Goal INR: 2.5-3.5  Duration of Therapy: chronic    Hemorrhagic Risk Score: 1  Warfarin Tablet Size: 5mg     Relevant Historic Information: CVA 2008; 2005; 2006    Referring Provider: Nadene Rubins    02/08/16: Pacemaker generator change INR goal ~ 2.5 with no heparin bridge.     SUBJECTIVE:   Walter Rice was last evaluated by [facility] on 06/16/16. His INR was 2.1 from 06/14/16 and he was instructed to increase his warfarin maintenance dose to 7.5mg  Mon/Wed and 5mg  all other days.       Today, pt reports no unusual bruising/bleeding. Diet consistent. Pt  suspects he missed a dose of warfarin 07/02/16. No acute illness or signs/sx of stroke noted by patient.  Activity level similar to last month.  Green vegetable intake "not very often."  No alcohol.        Present dose: Warfarin 7.5mg  Mon/Wed and 5mg  all other days.  Dose increased from 37.5mg /wk on 06/16/16 in response to the INR of 2.1 noted 06/14/16.    OBJECTIVE:     Relevant medication changes: None.  Med list updated/reviewed with patient today.      LABS:   Lab Results   Component Value Date    INR 2.3 07/07/2016    INR 2.1 06/14/2016    INR 2.2 05/09/2016    INR 2.8 03/11/2016    INR 3.4 02/09/2016    INR 3.2 02/08/2016    INR 2.9 02/05/2016    INR 2.9 01/25/2016       ASSESSMENT:   INR below goal for a 3rd consecutive reading despite an increase in warfarin maintenance dose 3 weeks ago.  The most likely explanation is a missed dose last week.  It is likely he has not returned to steady state on this maintenance dose with that in mind.  Prefer weekly INR testing for now but unfortunately, he is scheduled to be out of town next week.     Message to patient and his wife that if his travel plans change next week, it would be ideal to have him return to lab next week.    Will make one time adjustment only and maintain present maintenance dose given pt is following his usual routine at  home.      PLAN:   1. Warfarin 7.5mg  today.  Then resume warfarin 7.5mg  Mon/Wed and 5mg  all other days. (40mg /wk)  2. Return in 2 weeks (on 07/22/2016). INR @ [facility].  3. Kasan verbally expressed understanding of the above plan.        Dia Sitter, PharmD

## 2016-07-21 ENCOUNTER — Encounter (HOSPITAL_BASED_OUTPATIENT_CLINIC_OR_DEPARTMENT_OTHER): Payer: Self-pay | Admitting: Interventional Cardiology

## 2016-07-21 ENCOUNTER — Ambulatory Visit
Payer: No Typology Code available for payment source | Attending: Interventional Cardiology | Admitting: Interventional Cardiology

## 2016-07-21 VITALS — BP 117/62 | HR 70 | Temp 98.6°F | Ht 72.0 in | Wt 174.8 lb

## 2016-07-21 DIAGNOSIS — Q212 Atrioventricular septal defect, unspecified as to partial or complete: Secondary | ICD-10-CM

## 2016-07-21 DIAGNOSIS — Z952 Presence of prosthetic heart valve: Secondary | ICD-10-CM | POA: Insufficient documentation

## 2016-07-21 DIAGNOSIS — R9431 Abnormal electrocardiogram [ECG] [EKG]: Secondary | ICD-10-CM

## 2016-07-21 DIAGNOSIS — Z6823 Body mass index (BMI) 23.0-23.9, adult: Secondary | ICD-10-CM

## 2016-07-21 MED ORDER — AMOXICILLIN 500 MG OR CAPS
2000.0000 mg | ORAL_CAPSULE | Freq: Once | ORAL | 3 refills | Status: AC | PRN
Start: 2016-07-21 — End: 2016-07-21

## 2016-07-21 NOTE — Progress Notes (Signed)
Chief Complaint/Reason for Visit  Chief Complaint   Patient presents with   . Congenital Anomaly       Patient Active Problem List    Diagnosis Date Noted   . 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 1999.  2008 gen change.   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        . Low back pain [M54.5] 08/04/2014   . Weakness of trunk musculature [M62.81] 08/04/2014   . Hamstring tightness of both lower extremities [M62.9] 08/04/2014   . Midline low back pain without sciatica [M54.5] 08/04/2014   . 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     A) Status post repair with ventricular septal defect closure in February 1989.   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 Jude prosthetic valve in 1993.       . 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       . 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  TX FROM [place]     . Headache [R51]      TX FROM [place]     . Mitral valve replaced [Z95.2] 04/13/2013   . Chronic anticoagulation [Z79.01] 02/07/2013     [place] [facility] ENROLLED     . CVA (cerebral vascular accident) (HCC) [I63.9] 09/10/2011   . Sprain of ankle- Right 06/2011 [S93.409A] 06/16/2011       Review of patient's allergies indicates:  Allergies   Allergen Reactions   . Sulfa Antibiotics Rash       Current Outpatient Prescriptions   Medication Sig Dispense Refill   . Acetaminophen 500 MG Oral Tab Take 2 tablet by mouth every 8 hours if needed to relieve pain 60 Tab prn   . Amoxicillin 500 MG Oral Cap Take 4 capsules (2,000 mg) by mouth one time as needed (30-60 min prior to dental visits) for up to 1 dose. 30-60 min prior to dental visits 4 capsule 3   . Cyclobenzaprine HCl 5 MG Oral Tab Take 1-2 tabs as needed for muscle spasms of back. Start by taking at  night, can increase to three times per day. 60 tablet 3   . Warfarin Sodium 5 MG Oral Tab Take 1 and 1/2 tabs (7.5mg ) by mouth on Mon and 1 tab (5mg ) all other days or as directed by [facility] (Patient taking differently: Take 1 and 1/2 tabs (7.5mg ) by mouth on Mon and Wednesday and 1 tab (5mg ) all other days or as directed by [facility]) 96 tablet 1   . Warfarin Sodium 5 MG Oral Tab 7.5mg  Mon & 5mg  other days or per [name]       No current facility-administered medications for this visit.        Interval History  Clavin returns for routine annual f/u today.  He underwent a generator replacement approximately 6 months ago and has been doing well since.  Although he is not particularly active, he continues to feel well without cardiovascular symptoms.  He denies chest discomfort, exertional dyspnea, or palpitations.  He is joined by his wife today who also has no concerns.  He continues to take his warfarin as prescribed.  He is long over due for a dental visit.    Review of Systems:  Complete review of systems is performed and is negative except as detailed in the history above.      No family history on file.    Social History     Social History   . Marital status: Significant other     Spouse name: N/A   . Number of children: N/A   . Years of education: N/A     Occupational History   . Not on file.     Social History Main Topics   . Smoking status: Former Smoker     Packs/day: 0.25     Types: Cigarettes     Quit date: 05/07/2015   . Smokeless tobacco: Current User   . Alcohol use No   . Drug use: No   . Sexual activity: Not on file     Other Topics Concern   . Not on file     Social History Narrative   . No narrative on file       Physical Exam:  BP 117/62   Pulse 70   Temp 98.6 F (37 C) (Temporal)   Ht 6' (1.829 m)   Wt 174 lb 12.8 oz (79.3 kg)   SpO2 98%   BMI 23.71 kg/m   Gen: No acute distress, answers all questions appropriately  HEENT: anicteric sclera, moist mucus membranes  Pulm: clear to auscultation  bilaterally, no wheezing, ronchi, or rales  CV: regular rhythm and rate, mechanical S1 and normal S2, 2/6 systolic murmurs heard loudest at the base of the heart, diastole is quiet, no peripheral edema  Abd: soft, nontender, nondistended  Ext: warm, well-perfused, good distal pulses  Skin: no rashes or bruises  Neuro: alert and oriented, fluent speech, normal gait    EKG Results:  Atrially pace rhythm, HR 64 bpm    Assessment/Plan:   Haskel KhanBrent Glazier is a 30 y/o M with a 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  [name] is doing well without issues at this time.  Per plan, he did not undergo echocardiography today and his history is reassuring such that additional testing is not required at this time.  We will plan on a repeat visit with an echo in 1 year.  Additionally, I have prescribed Chad amoxicillin for dental prophylaxis.

## 2016-07-21 NOTE — Patient Instructions (Signed)
1.  Echocardiogram and clinic appointment in 1 year

## 2016-07-22 ENCOUNTER — Ambulatory Visit (HOSPITAL_BASED_OUTPATIENT_CLINIC_OR_DEPARTMENT_OTHER): Payer: No Typology Code available for payment source | Admitting: Pediatric Cardiology

## 2016-07-22 ENCOUNTER — Telehealth (HOSPITAL_BASED_OUTPATIENT_CLINIC_OR_DEPARTMENT_OTHER): Payer: Self-pay

## 2016-07-25 ENCOUNTER — Encounter (HOSPITAL_BASED_OUTPATIENT_CLINIC_OR_DEPARTMENT_OTHER): Payer: Self-pay

## 2016-07-25 NOTE — Progress Notes (Signed)
Walter Rice is 3 days overdue for lab testing related to anticoagulant therapy. Left reminder message by telephone and mailed reminder letter today.

## 2016-07-28 ENCOUNTER — Other Ambulatory Visit (HOSPITAL_BASED_OUTPATIENT_CLINIC_OR_DEPARTMENT_OTHER): Payer: Self-pay | Admitting: Pharmacotherapy

## 2016-07-28 ENCOUNTER — Telehealth (HOSPITAL_BASED_OUTPATIENT_CLINIC_OR_DEPARTMENT_OTHER): Payer: No Typology Code available for payment source | Admitting: Pharmacist

## 2016-07-28 DIAGNOSIS — Z7901 Long term (current) use of anticoagulants: Secondary | ICD-10-CM

## 2016-07-28 DIAGNOSIS — Z952 Presence of prosthetic heart valve: Secondary | ICD-10-CM

## 2016-07-28 LAB — PROTHROMBIN TIME: Prothrombin INR: 3.5

## 2016-07-28 NOTE — Telephone Encounter (Signed)
ANTICOAGULATION TREATMENT PLAN    Indication: MVR; recurrent CVA  Goal INR: 2.5-3.5  Duration of Therapy: chronic    Hemorrhagic Risk Score: 1  Warfarin Tablet Size: 5mg     Relevant Historic Information: CVA 2008; 2005; 2006    Referring Provider: Nadene Rubins    02/08/16: Pacemaker generator change INR goal ~ 2.5 with no heparin bridge.    SUBJECTIVE:   Walter Rice was last evaluated by [facility] on 07/08/16 regarding his INR.   INR was 2.3 and he was instructed to take one loading dose and then resume usual dose of warfarin.   Pt was rescheduled for appt in 2 weeks.     Spoke to pt about his INR result from today.  He denies bleeding or bruising.  Denies any signs/sxs of stroke/TIA.      No significant changes in diet, activity or health since last [facility] visit.  He drinks EtOH occasionally and had one drink last night.  He is going to out of town from 11/04 through 08/19/16.    OBJECTIVE:   Present dose: Warfarin dose 7.5 mg on Mon/Wed and 5 mg on all other days.  Pt took 7.5 mg instead of 5 mg on 07/08/16.  No dosing error (denies missed or extra doses)       LABS:   Lab Results   Component Value Date    INR 3.5 07/28/2016    INR 2.3 07/07/2016    INR 2.1 06/14/2016    INR 2.2 05/09/2016     ASSESSMENT:   INR on higher end of therapeutic range possibly due to transient use of EtOH.  Will continue on same dose.     PLAN:   1.  Continue warfarin dose 7.5 mg on Mon/Wed and 5 mg on all other days   2.  Return in 3 weeks (on 08/20/2016).  3.  Pt verbalized understanding and agreed to above plan.    Royston Bake, PharmD

## 2016-08-06 ENCOUNTER — Other Ambulatory Visit: Payer: Self-pay

## 2016-08-20 ENCOUNTER — Telehealth (HOSPITAL_BASED_OUTPATIENT_CLINIC_OR_DEPARTMENT_OTHER): Payer: Self-pay

## 2016-08-21 ENCOUNTER — Other Ambulatory Visit (HOSPITAL_BASED_OUTPATIENT_CLINIC_OR_DEPARTMENT_OTHER): Payer: Self-pay | Admitting: Pharmacotherapy

## 2016-08-21 ENCOUNTER — Telehealth (HOSPITAL_BASED_OUTPATIENT_CLINIC_OR_DEPARTMENT_OTHER): Payer: No Typology Code available for payment source | Admitting: Pharmacist

## 2016-08-21 DIAGNOSIS — Z952 Presence of prosthetic heart valve: Secondary | ICD-10-CM

## 2016-08-21 DIAGNOSIS — Z7901 Long term (current) use of anticoagulants: Secondary | ICD-10-CM

## 2016-08-21 LAB — PROTHROMBIN TIME: Prothrombin INR: 2.7

## 2016-08-21 MED ORDER — WARFARIN SODIUM 5 MG OR TABS
ORAL_TABLET | ORAL | 1 refills | Status: DC
Start: 2016-08-21 — End: 2016-08-21

## 2016-08-21 MED ORDER — WARFARIN SODIUM 5 MG OR TABS
ORAL_TABLET | ORAL | 3 refills | Status: DC
Start: 2016-08-21 — End: 2016-09-23

## 2016-08-21 NOTE — Telephone Encounter (Signed)
ANTICOAGULATION TREATMENT PLAN    Indication: MVR; recurrent CVA  Goal INR: 2.5-3.5  Duration of Therapy: chronic    Hemorrhagic Risk Score: 1  Warfarin Tablet Size: 5mg     Relevant Historic Information: CVA 2008; 2005; 2006    Referring Provider: Nadene Rubins    02/08/16: Pacemaker generator change INR goal ~ 2.5 with no heparin bridge.      SUBJECTIVE:   Walter Rice was last evaluated by [name] on 07/28/16.  His INR was 3.5 and he was instructed to CONTINUE his current dose and return for follow-up in 3 weeks    I spoke with [name] over the phone.  He denies any unusual bleeding or bruising since last [facility] encounter.  No blood in the urine or black stools.  No signs or symptoms of CVA.    Diet: he has green vegetables once weekly on average  Alcohol consumption: one drink per month  Activity level: unchanged  Non-prescription or herbal medication use: none      OBJECTIVE:   Present anticoagulant dose: Warfarin 7.5mg  MW and 5mg  others = 40mg /week  He missed one dose two weeks ago when he was on vacation    Relevant medication changes: none      LABS:   Lab Results   Component Value Date    INR 2.7 08/21/2016    INR 3.5 07/28/2016    INR 2.3 07/07/2016    INR 2.1 06/14/2016    INR 2.2 05/09/2016       ASSESSMENT:   Walter Rice Rock's INR is within his target range of 2.5 to 3.5 and he appears to be tolerating warfarin therapy without significant problems.      PLAN:   1) CONTINUE warfarin 7.5mg  MW and 5mg  others    2) Return in 3 weeks (on 09/11/2016).    3) He agrees with and verbally expressed understanding of the plan.    [name], PharmD

## 2016-09-11 ENCOUNTER — Telehealth (HOSPITAL_BASED_OUTPATIENT_CLINIC_OR_DEPARTMENT_OTHER): Payer: Self-pay

## 2016-09-18 ENCOUNTER — Telehealth (HOSPITAL_BASED_OUTPATIENT_CLINIC_OR_DEPARTMENT_OTHER): Payer: Self-pay

## 2016-09-18 NOTE — Telephone Encounter (Signed)
Walter Rice is 1 week overdue for lab testing related to anticoagulant therapy.  Left reminder message by telephone.

## 2016-09-23 ENCOUNTER — Telehealth (HOSPITAL_BASED_OUTPATIENT_CLINIC_OR_DEPARTMENT_OTHER): Payer: No Typology Code available for payment source | Admitting: Pharmacist

## 2016-09-23 ENCOUNTER — Other Ambulatory Visit (HOSPITAL_BASED_OUTPATIENT_CLINIC_OR_DEPARTMENT_OTHER): Payer: Self-pay | Admitting: Pharmacotherapy

## 2016-09-23 DIAGNOSIS — Z7901 Long term (current) use of anticoagulants: Secondary | ICD-10-CM

## 2016-09-23 DIAGNOSIS — Z952 Presence of prosthetic heart valve: Secondary | ICD-10-CM

## 2016-09-23 LAB — PROTHROMBIN TIME: Prothrombin INR: 3.2

## 2016-09-23 MED ORDER — WARFARIN SODIUM 5 MG OR TABS
ORAL_TABLET | ORAL | 5 refills | Status: DC
Start: 2016-09-23 — End: 2017-06-12

## 2016-09-23 NOTE — Telephone Encounter (Signed)
ANTICOAGULATION TREATMENT PLAN    Indication: MVR; recurrent CVA  Goal INR: 2.5-3.5  Duration of Therapy: chronic    Hemorrhagic Risk Score: 1  Warfarin Tablet Size: 5mg     Relevant Historic Information: CVA 2008; 2005; 2006    Referring Provider: Nadene Rubins    02/08/16: Pacemaker generator change INR goal ~ 2.5 with no heparin bridge.        SUBJECTIVE:   Walter Rice was last evaluated by [facility] on 08/21/16.   His INR was 2.7 and he was instructed to continue warfarin 7.5mg  MW, 5mg  all other days (40mg /wk) with followup in 3 weeks. He is ~2 weeks overdue.    Today, I spoke to Walter Rice, and he reports no s/sx of bleeding or bruising. No s/sx of CVA/TIA or thrombosis. His diet is consistent. He ate a salad last night. He usually eats greens once a week on average. No alcohol recently. His activity level is consistent. He reports having diarrhea x 1 episode last week. This has resolved. There have been no acute illnesses or changes to overall health. No medication changes and no upcoming procedures.    OBJECTIVE:     Current  dose:   Warfarin 7.5mg  MW, 5mg  all other days (40mg /wk). No errors reported.    Relevant medication changes:   none       LABS:   Lab Results   Component Value Date    INR 3.2 09/23/2016    INR 2.7 08/21/2016    INR 3.5 07/28/2016    INR 2.3 07/07/2016    INR 2.1 06/14/2016       ASSESSMENT:   Therapeutic INR on current warfarin dose in patient without warfarin related complications      PLAN:   1. Continue warfarin 7.5mg  MW, 5mg  all other days  (40mg /wk)  2. Return in 4 weeks (on 10/21/2016).   3. Walter Rice acknowledged understanding of this plan    Dianna Limbo, PharmD

## 2016-09-25 ENCOUNTER — Telehealth (HOSPITAL_BASED_OUTPATIENT_CLINIC_OR_DEPARTMENT_OTHER): Payer: Self-pay | Admitting: Pediatrics

## 2016-09-25 NOTE — Telephone Encounter (Signed)
(  TEXTING IS AN OPTION FOR [facility] CLINICS ONLY)  Is this a [facility] clinic? No      RETURN CALL: No call back      SUBJECT:  General Message     REASON FOR REQUEST: letter, wrong address    MESSAGE: Patient's aunt is upset because clinic sent a letter for patient about Dr. Burney Gauze departure to her home. CCR verified that the [facility] address on chart is current address for patient. Please print and resend, thanks.

## 2016-10-21 ENCOUNTER — Telehealth (HOSPITAL_BASED_OUTPATIENT_CLINIC_OR_DEPARTMENT_OTHER): Payer: No Typology Code available for payment source

## 2016-10-24 ENCOUNTER — Telehealth (HOSPITAL_BASED_OUTPATIENT_CLINIC_OR_DEPARTMENT_OTHER): Payer: No Typology Code available for payment source

## 2016-10-29 ENCOUNTER — Encounter (HOSPITAL_BASED_OUTPATIENT_CLINIC_OR_DEPARTMENT_OTHER): Payer: Self-pay

## 2016-10-29 NOTE — Progress Notes (Signed)
Walter Rice is 3 days overdue for lab testing related to anticoagulant therapy. Left reminder message by telephone and mailed reminder letter today.

## 2016-10-31 ENCOUNTER — Telehealth (HOSPITAL_BASED_OUTPATIENT_CLINIC_OR_DEPARTMENT_OTHER): Payer: Self-pay

## 2016-10-31 NOTE — Telephone Encounter (Signed)
Walter Rice is 1 week overdue for lab testing related to anticoagulant therapy.  Left reminder message by telephone.

## 2016-11-03 LAB — PROTHROMBIN TIME: Prothrombin INR: 1.8

## 2016-11-04 ENCOUNTER — Other Ambulatory Visit (HOSPITAL_BASED_OUTPATIENT_CLINIC_OR_DEPARTMENT_OTHER): Payer: Self-pay | Admitting: Pharmacotherapy

## 2016-11-04 ENCOUNTER — Telehealth (HOSPITAL_BASED_OUTPATIENT_CLINIC_OR_DEPARTMENT_OTHER): Payer: No Typology Code available for payment source | Admitting: Pharmacist

## 2016-11-04 DIAGNOSIS — Z7901 Long term (current) use of anticoagulants: Secondary | ICD-10-CM

## 2016-11-04 DIAGNOSIS — Z952 Presence of prosthetic heart valve: Secondary | ICD-10-CM

## 2016-11-04 NOTE — Telephone Encounter (Signed)
ANTICOAGULATION TREATMENT PLAN    Indication: MVR; recurrent CVA  Goal INR: 2.5-3.5  Duration of Therapy: chronic    Hemorrhagic Risk Score: 1  Warfarin Tablet Size: 5mg     Relevant Historic Information: CVA 2008; 2005; 2006    Referring Provider: Nadene Rubins    02/08/16: Pacemaker generator change INR goal ~ 2.5 with no heparin bridge.        SUBJECTIVE:   Walter Rice was last evaluated by [facility] on 09/23/16.   His INR was 3.2 and he was instructed to continue warfarin 7.5mg  MW, 5mg  all other days (40mg /wk) with followup in 4 weeks.    Today, I spoke to Walter Rice' wife Walter Rice, and she reports no s/sx of bleeding or bruising. No s/sx of CVA/TIA or thrombosis. His diet is consistent for the most part. However, with Walter Rice being pregnant, she has had to change her meal schedule, which may have affected his meal schedule as well. For the most part, his Vit K intake is consistent. His activity level is consistent. Walter Rice reports that he did have alcohol last week x 1 night. She reports that he has been taking Tylenol recently (possibly for a headache or stomach upset). He has been experiencing some upset stomach, which may be related to stress or some of the foods he is eating.  Med change include 2 grams of amoxicillin prior to a dental procedure after his INR was drawn. No other medication changes and no upcoming procedures.    OBJECTIVE:     Current  dose:   Warfarin 7.5mg  MW, 5mg  all other days (40mg /wk). No errors reported.    Relevant medication changes:   none       LABS:   Lab Results   Component Value Date    INR 1.8 11/03/2016    INR 3.2 09/23/2016    INR 2.7 08/21/2016    INR 3.5 07/28/2016    INR 2.3 07/07/2016         ASSESSMENT:   INR subtherapeutic with no clear etiology. No s/sx of thrombosis. Will give 2 loading doses and re-evaluate early next week. Advised to f/u with PCP if stomach issues worsen. Walter Rice will monitor his diet this week.    PLAN:   1. Take warfarin 7.5mg  x 1 now (11/04/16), then  take 10mg  x 1 tomorrow (11/05/16), then resume 7.5mg  MW, 5mg  all other days  (40mg /wk)  2. Return in 6 days (on 11/10/2016).   3. Walter Rice acknowledged understanding of this plan. Plan emailed to her and [name]    Dianna Limbo, PharmD

## 2016-11-10 ENCOUNTER — Telehealth (HOSPITAL_BASED_OUTPATIENT_CLINIC_OR_DEPARTMENT_OTHER): Payer: Self-pay

## 2016-11-13 ENCOUNTER — Telehealth (HOSPITAL_BASED_OUTPATIENT_CLINIC_OR_DEPARTMENT_OTHER): Payer: Self-pay

## 2016-11-13 NOTE — Telephone Encounter (Signed)
Walter Rice is 3 days overdue for lab testing related to anticoagulant therapy. Left reminder message by telephone and mailed reminder letter today.

## 2016-11-14 LAB — PROTHROMBIN TIME: Prothrombin INR: 3.2

## 2016-11-17 ENCOUNTER — Telehealth (HOSPITAL_BASED_OUTPATIENT_CLINIC_OR_DEPARTMENT_OTHER): Payer: No Typology Code available for payment source | Admitting: Pharmacist

## 2016-11-17 ENCOUNTER — Other Ambulatory Visit (HOSPITAL_BASED_OUTPATIENT_CLINIC_OR_DEPARTMENT_OTHER): Payer: Self-pay | Admitting: Pharmacotherapy

## 2016-11-17 DIAGNOSIS — Z7901 Long term (current) use of anticoagulants: Secondary | ICD-10-CM

## 2016-11-17 DIAGNOSIS — Z952 Presence of prosthetic heart valve: Secondary | ICD-10-CM

## 2016-11-17 NOTE — Telephone Encounter (Signed)
ANTICOAGULATION TREATMENT PLAN    Indication: MVR; recurrent CVA  Goal INR: 2.5-3.5  Duration of Therapy: chronic    Hemorrhagic Risk Score: 1  Warfarin Tablet Size: 5mg     Relevant Historic Information: CVA 2008; 2005; 2006    Referring Provider: Nadene Rubins    02/08/16: Pacemaker generator change INR goal ~ 2.5 with no heparin bridge.      SUBJECTIVE:   Walter Rice was last evaluated by [facility] on 11/04/16.   His INR was 1.8 with no clear etiology. He was instructed to take warfarin 7.5mg  on 11/04/16, 10mg  on 11/05/16, then resume 7.5mg  MW, 5mg  others  (40mg /wk) with followup in 6 days (11/10/16). He failed his return visit (>3 days overdue).     Today, I spoke to Walter Rice, and he reports no s/sx of bleeding or unusual bruising. Denies melena or hematuria. No s/sx of CVA/TIA.     He had one serving of asparagus and Brussels sprouts last night, otherwise doesn't consume green leafy vegetables. Denies EtOH intake since last visit, otherwise drinks rarely. His activity level is unchanged.     There have been no acute illnesses or changes to his health. No medication changes and no upcoming procedures.    OBJECTIVE:     Current  dose: Warfarin 7.5mg  on 11/04/16, 10mg  on 11/05/16, then resume 7.5mg  MW, 5mg  others  (40mg /wk). No errors reported.      LABS:   Lab Results   Component Value Date    INR 3.2 11/14/2016    INR 1.8 11/03/2016    INR 3.2 09/23/2016    INR 2.7 08/21/2016    INR 3.5 07/28/2016    INR 2.3 07/07/2016        Relevant medication changes: none      ASSESSMENT:   Therapeutic INR in response to two loading doses two weeks ago. Advised to stay consistent with dark green leafy vegetables. He is not planning to consume more greens. Appropriate to continue current dose as patient is without any warfarin related complications.     PLAN:   1. Continue warfarin 7.5mg  MF, 5mg  others (40mg /wk)  2. Return in 2 weeks (on 12/01/2016).   3. Walter Rice acknowledged understanding of this plan  4. Plan  emailed to [facility] and Rolin Barry, PharmD

## 2016-12-01 ENCOUNTER — Telehealth (HOSPITAL_BASED_OUTPATIENT_CLINIC_OR_DEPARTMENT_OTHER): Payer: Self-pay

## 2016-12-04 ENCOUNTER — Telehealth (HOSPITAL_BASED_OUTPATIENT_CLINIC_OR_DEPARTMENT_OTHER): Payer: Self-pay

## 2016-12-04 NOTE — Telephone Encounter (Signed)
Walter Rice is 3 days overdue for lab testing related to anticoagulant therapy. Left reminder message by telephone and mailed reminder letter today.

## 2016-12-09 LAB — PROTHROMBIN TIME: Prothrombin INR: 4.7

## 2016-12-10 ENCOUNTER — Telehealth (HOSPITAL_BASED_OUTPATIENT_CLINIC_OR_DEPARTMENT_OTHER): Payer: No Typology Code available for payment source | Admitting: Pharmacist

## 2016-12-10 ENCOUNTER — Other Ambulatory Visit (HOSPITAL_BASED_OUTPATIENT_CLINIC_OR_DEPARTMENT_OTHER): Payer: Self-pay | Admitting: Pharmacotherapy

## 2016-12-10 DIAGNOSIS — Z7901 Long term (current) use of anticoagulants: Secondary | ICD-10-CM

## 2016-12-10 DIAGNOSIS — Z952 Presence of prosthetic heart valve: Secondary | ICD-10-CM

## 2016-12-10 NOTE — Telephone Encounter (Addendum)
ANTICOAGULATION TREATMENT PLAN    Indication: MVR; recurrent CVA  Goal INR: 2.5-3.5  Duration of Therapy: chronic    Hemorrhagic Risk Score: 1  Warfarin Tablet Size: 5mg     Relevant Historic Information: CVA 2008; 2005; 2006    Referring Provider: Nadene Rubins    02/08/16: Pacemaker generator change INR goal ~ 2.5 with no heparin bridge.      SUBJECTIVE:   Walter Rice was last evaluated by [facility] on 12/04/16. His INR was 3.2 in response to loading doses and he was instructed to continue warfarin 7.5mg  MF and 5mg  others (40mg /wk) with followup in 2 weeks (12/01/16). He failed his return visit (>3 days overdue).     Today, I spoke to Walter Rice, and he reports no s/sx of bleeding or unusual bruising. Denies melena or hematuria. No s/sx of CVA/TIA.     He had a serving of spinach last week which he usually doesn't eat. He eats 1/2 a avocado most days of the week. He states "I eat whatever my wife cooks". EtOH intake unchanged (drinks occasionally). His activity level is unchanged.     There have been no acute illnesses or changes to his health. No medication changes and no upcoming procedures.    OBJECTIVE:     Current  dose: Warfarin 7.5mg  MF, 5mg  others (40mg /wk). No errors reported.      LABS:   Lab Results   Component Value Date    INR 4.7 12/09/2016    INR 3.2 11/14/2016    INR 1.8 11/03/2016    INR 3.2 09/23/2016    INR 2.7 08/21/2016    INR 3.5 07/28/2016        Relevant medication changes: none      ASSESSMENT:   Supra-therapeutic INR with no clear etiology other than inconsistency with intake of vitamin K. He eats whatever is cooked. Recommended to have his wife call [facility] and we can discuss this since he doesn't cook and doesn't really under the concept of consistency.     I emailed Willaim Rice and asked her to stay consistent with vitamin K and call [facility] with any questions and concerns.     PLAN:   1. HOLD warfarin tonight (12/10/16), then resume 7.5mg  MF, 5mg  others (40mg /wk)  2. Return in 2 weeks (on 12/23/2016).    3. Taurus Majkut acknowledged understanding of this plan  4. Plan emailed to [facility] and Rolin Barry, PharmD      [facility] 12/11/16    Willaim Rice reports electronically for Walter Rice:     "Hi I just got your email. 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 He does not eat a half avocado every day! LOL !!! He maybe has that once in a while, he did however have a salad (first time in weeks) this weekend for lunch with avocado on it. But I spread out green vegetables to once or twice a week and usually a day or two in between. Typically 1-2 servings of a green veggie or salad mix every two weeks or so.    In general I try to keep a balance and consistent menu. I know he's probably been more active lately because we just moved over the last month. Not sure if this contributes to his INR, but that's one of the only things that have been different. Also he's kinda started smoking again. But we are working on getting him to quit.    Let me know what you think.Walter KitchenMarland Rice"  [name]     No changes in above plan. Asked [name] electronically as to how many cigarettes he has been smoking per day.     Forestine Chute) Eula Listen, PharmD  Clinical Pharmacist, Anticoagulation Clinic   [place] [facility]     ADDENDUM 12/12/16    Willaim Rice reports for [place]: "He is probably smoking about a pack every two weeks or so. It's been hectic with moving and I'm [redacted] weeks pregnant so it was mostly on him. Between that and working, I haven't paid much attention to it. Also, he's been drinking NOS energy drinks... literally the worst thing he could drink.. but those have been like 1-2 a week. It's all bad! Lol I try to get him to quit but he won't.haha so let me know if we need a lecture !"     Advised to notify [facility] if he decides to quit smoking as [facility] can only make recommendation, but it should come from his medical physician. No change in above plan. Will f/u as previously scheduled.      Forestine Chute) Eula Listen, PharmD  Clinical  Pharmacist, Anticoagulation Clinic   [facility]

## 2016-12-23 ENCOUNTER — Telehealth (HOSPITAL_BASED_OUTPATIENT_CLINIC_OR_DEPARTMENT_OTHER): Payer: No Typology Code available for payment source

## 2016-12-26 ENCOUNTER — Telehealth (HOSPITAL_BASED_OUTPATIENT_CLINIC_OR_DEPARTMENT_OTHER): Payer: No Typology Code available for payment source | Admitting: Pharmacist

## 2016-12-26 ENCOUNTER — Other Ambulatory Visit (HOSPITAL_BASED_OUTPATIENT_CLINIC_OR_DEPARTMENT_OTHER): Payer: Self-pay | Admitting: Pharmacotherapy

## 2016-12-26 DIAGNOSIS — Z952 Presence of prosthetic heart valve: Secondary | ICD-10-CM

## 2016-12-26 DIAGNOSIS — Z7901 Long term (current) use of anticoagulants: Secondary | ICD-10-CM

## 2016-12-26 LAB — PROTHROMBIN TIME: Prothrombin INR: 5

## 2016-12-26 NOTE — Telephone Encounter (Signed)
ANTICOAGULATION TREATMENT PLAN    Indication: MVR; recurrent CVA  Goal INR: 2.5-3.5  Duration of Therapy: chronic    Hemorrhagic Risk Score: 1  Warfarin Tablet Size: 5mg     Relevant Historic Information: CVA 2008; 2005; 2006    Referring Provider: Nadene Rubins    02/08/16: Pacemaker generator change INR goal ~ 2.5 with no heparin bridge.  3/18: stopped smoking ~12/13/16    SUBJECTIVE:  Walter Rice was last evaluated by [facility] on 12/10/2016.  The INR on 12/09/16 was 4.7 (goal 2.5-3.5). He was instructed to HOLD warfarin x1, then resume usual dose of 7.5mg  MF, 5mg  others (40mg /wk).  He was scheduled for follow-up appt in 2 weeks.    Today he denies any unusual bruising or bleeding, and denies signs/sxs of TIA/stroke. No recent falls reported. No acute illnesses.  Denies signs of fluid retention. Only change is that he stopped smoking about 2 weeks ago.    Diet: No changes reported since last [facility] visit. He is consistent with ~1-2 servings of greens (Vit K rich foods) per week.   Alcohol: No alcohol reported.   Tobacco: Stopped smoking about 2 weeks ago. Was smoking ~1 ppd.  Activity:  No changes reported since last [facility] visit.  Acute illnesses/Health changes:  None reported.  Medication changes: None reported.  Upcoming procedures: None reported.    WARFARIN DOSE:   Warfarin HELD x1 on 12/10/16, then resumed 7.5mg  MF, 5mg  others (40mg /wk).   No warfarin dosing errors reported.    LABS:   Lab Results   Component Value Date    INR 5.0 12/26/2016    INR 4.7 12/09/2016    INR 3.2 11/14/2016    INR 1.8 11/03/2016    INR 3.2 09/23/2016       ASSESSMENT:   INR above goal range possibly related to recent smoking cessation but this is unclear as his last INR was also markedly elevated.  Fortunately, no bleeding sxs. Will be cautious and decrease the warfarin maintenance dose.  Monitor closely until stable.     PLAN:   1.  HOLD warfarin today on 12/26/16, 2.5mg  on 12/27/16, then decrease to 5mg  daily for now.  2.  Return in 5 days  (on 12/31/2016).   3  Seek immediate medical attention if you have any s/sxs of bleeding.  4.  Walter Rice expressed understanding of plan outlined above.  5.  Emailed above plan to wife and sister.     Alfred Levins, PharmD

## 2016-12-31 ENCOUNTER — Telehealth (HOSPITAL_BASED_OUTPATIENT_CLINIC_OR_DEPARTMENT_OTHER): Payer: Self-pay

## 2017-01-05 ENCOUNTER — Telehealth (HOSPITAL_BASED_OUTPATIENT_CLINIC_OR_DEPARTMENT_OTHER): Payer: Self-pay

## 2017-01-05 NOTE — Telephone Encounter (Signed)
Walter Rice is 3 days overdue for lab testing related to anticoagulant therapy. Left reminder message by telephone and mailed reminder letter today.

## 2017-01-06 LAB — PROTHROMBIN TIME: Prothrombin INR: 3.7

## 2017-01-07 ENCOUNTER — Telehealth (HOSPITAL_BASED_OUTPATIENT_CLINIC_OR_DEPARTMENT_OTHER): Payer: No Typology Code available for payment source | Admitting: Pharmacist

## 2017-01-07 ENCOUNTER — Other Ambulatory Visit (HOSPITAL_BASED_OUTPATIENT_CLINIC_OR_DEPARTMENT_OTHER): Payer: Self-pay | Admitting: Pharmacotherapy

## 2017-01-07 DIAGNOSIS — Z952 Presence of prosthetic heart valve: Secondary | ICD-10-CM

## 2017-01-07 DIAGNOSIS — Z7901 Long term (current) use of anticoagulants: Secondary | ICD-10-CM

## 2017-01-07 NOTE — Telephone Encounter (Signed)
ANTICOAGULATION TREATMENT PLAN    Indication: MVR; recurrent CVA  Goal INR: 2.5-3.5  Duration of Therapy: chronic    Hemorrhagic Risk Score: 1  Warfarin Tablet Size: 5mg     Relevant Historic Information: CVA 2008; 2005; 2006    Referring Provider: Nadene Rubins    02/08/16: Pacemaker generator change INR goal ~ 2.5 with no heparin bridge.  3/18: stopped smoking ~12/13/16    SUBJECTIVE:  Walter Rice was last evaluated by [facility] on 12/26/2016 at which time the INR was elevated at 5.0 (goal 2.5-3.5). He was instructed to hold warfarin, then decrease the maintenance dose to 5mg  daily (~12% decrease).  He was scheduled for follow-up appt on 3/28, and is 6 days overdue.    Today he denies any unusual bruising or bleeding, and denies signs/sxs of TIA/stroke. He had sleepless night as his wife is [redacted] weeks pregnant and was up most of the night with stomach pain.    Diet: No changes reported since last [facility] visit. He is consistent with ~1-2 servings of greens (Vit K rich foods) per week.   Alcohol: No alcohol reported.   Tobacco: Continues to mostly abstain from smoking cigarettes. He reports smoking one day this week.   Activity:  No changes reported since last [facility] visit.  Acute illnesses/Health changes:  None reported.  Medication changes: None reported.  Upcoming procedures: None reported.    WARFARIN DOSE:   Warfarin HELD 3/23 when INR=5.0, 2.5mg  on 3/24, then reduced to 5mg  daily (35mg /week) from 40mg /week.    No warfarin dosing errors reported.    LABS:   Lab Results   Component Value Date    INR 3.7 01/06/2017    INR 5.0 12/26/2016    INR 4.7 12/09/2016    INR 3.2 11/14/2016    INR 1.8 11/03/2016       ASSESSMENT:   INR is slightly above goal range but trending down following reduced warfarin dose in setting of recent overanticoagulation. Need to give current dose more time to reach steady state.    PLAN:   1.  Take 2.5mg  this AM on 01/07/17 (missed warfarin 5mg  dose yesterday), then resume 5mg  daily (this PM).   2.   Return in 7 days (on 01/14/2017). Pls extend time between visits as appropriate.   3.  Walter Rice expressed understanding of plan outlined above.  4.  Emailed above plan to wife and sister.     Alfred Levins, PharmD

## 2017-01-14 ENCOUNTER — Telehealth (HOSPITAL_BASED_OUTPATIENT_CLINIC_OR_DEPARTMENT_OTHER): Payer: Self-pay

## 2017-01-16 LAB — PROTHROMBIN TIME: Prothrombin INR: 2.2

## 2017-01-19 ENCOUNTER — Telehealth (HOSPITAL_BASED_OUTPATIENT_CLINIC_OR_DEPARTMENT_OTHER): Payer: Self-pay

## 2017-01-19 ENCOUNTER — Other Ambulatory Visit (HOSPITAL_BASED_OUTPATIENT_CLINIC_OR_DEPARTMENT_OTHER): Payer: Self-pay | Admitting: Pharmacotherapy

## 2017-01-19 ENCOUNTER — Telehealth (HOSPITAL_BASED_OUTPATIENT_CLINIC_OR_DEPARTMENT_OTHER): Payer: No Typology Code available for payment source | Admitting: Pharmacist

## 2017-01-19 DIAGNOSIS — Z7901 Long term (current) use of anticoagulants: Secondary | ICD-10-CM

## 2017-01-19 DIAGNOSIS — Z952 Presence of prosthetic heart valve: Secondary | ICD-10-CM

## 2017-01-19 NOTE — Telephone Encounter (Signed)
ANTICOAGULATION TREATMENT PLAN    Indication: MVR; recurrent CVA  Goal INR: 2.5-3.5  Duration of Therapy: chronic    Hemorrhagic Risk Score: 1  Warfarin Tablet Size: 5mg     Relevant Historic Information: CVA 2008; 2005; 2006    Referring Provider: Nadene Rubins    02/08/16: Pacemaker generator change INR goal ~ 2.5 with no heparin bridge.  3/18: stopped smoking ~12/13/16     SUBJECTIVE:   Walter Rice was last evaluated by [facility] on 01/07/17. His INR was 3.7 01/06/17 and he was instructed to resume his warfarin dose to 5mg  daily after a small dose of 2.5mg  01/07/17.      Today, pt reports no unusual bruising/bleeding.  Walter Rice missed a dose of warfarin 01/10/17.  He denies acute signs/sx of stroke.    Diet: Stable.  Has a serving of green vegetable 1 time a week.   EtOH: Had one serving 01/10/17.  Activity: Stable.  Acute illness: Denies.      Present dose: Warfarin 5mg  daily (2.5mg  01/07/17)    OBJECTIVE:     Relevant medication changes: None.    LABS:   Lab Results   Component Value Date    INR 2.2 01/16/2017    INR 3.7 01/06/2017    INR 5.0 12/26/2016    INR 4.7 12/09/2016    INR 3.2 11/14/2016    INR 1.8 11/03/2016    INR 3.2 09/23/2016       ASSESSMENT:   INR below goal likely influenced by missed dose of warfarin in the week prior to this last INR test.  Prefer to allow patient more time on his warfarin maintenance dose in order for it to return to steady state.  Pt without any warfarin related concerns.     PLAN:   1. Continue warfarin 5mg  daily (35mg /wk)  2. Return in 10 days (on 01/29/2017). INR @ [facility].  3. Walter Rice verbally expressed understanding of the above plan.  Plan emailed to wife and sister.        Dia Sitter, PharmD

## 2017-01-29 ENCOUNTER — Telehealth (HOSPITAL_BASED_OUTPATIENT_CLINIC_OR_DEPARTMENT_OTHER): Payer: Self-pay

## 2017-01-29 LAB — PROTHROMBIN TIME: Prothrombin INR: 2.6

## 2017-01-30 ENCOUNTER — Other Ambulatory Visit (HOSPITAL_BASED_OUTPATIENT_CLINIC_OR_DEPARTMENT_OTHER): Payer: Self-pay | Admitting: Pharmacotherapy

## 2017-01-30 ENCOUNTER — Telehealth (HOSPITAL_BASED_OUTPATIENT_CLINIC_OR_DEPARTMENT_OTHER): Payer: No Typology Code available for payment source | Admitting: Pharmacist

## 2017-01-30 DIAGNOSIS — Z7901 Long term (current) use of anticoagulants: Secondary | ICD-10-CM

## 2017-01-30 DIAGNOSIS — Z952 Presence of prosthetic heart valve: Secondary | ICD-10-CM

## 2017-01-30 NOTE — Telephone Encounter (Signed)
ANTICOAGULATION TREATMENT PLAN    Indication: MVR; recurrent CVA  Goal INR: 2.5-3.5  Duration of Therapy: chronic    Hemorrhagic Risk Score: 1  Warfarin Tablet Size: 5mg     Relevant Historic Information: CVA 2008; 2005; 2006    Referring Provider: Nadene Rubins    02/08/16: Pacemaker generator change INR goal ~ 2.5 with no heparin bridge.  3/18: stopped smoking ~12/13/16    SUBJECTIVE:   Gleen Nortz was last evaluated by [name] on 01/19/17 regarding his INR.     INR result: 2.2  Pt was advised to continue on current dose of warfarin and rescheduled for next INR in ten days.    Spoke to pt about his INR result from yesterday.      Sxs of bleeding or bruising: denies  Signs/sxs of Stroke/TIA: denies  Acute illness: denies  Diet: consistent in greens  Appetite: unchanged since last [facility] visit   Activity level: unchanged since last [facility] visit   Med changes (including OTC): none  EtOH use: none recently, drinks EtOH occasionally  Upcoming procedures: none    OBJECTIVE:   Present dose: Warfarin dose 5 mg daily.  No dosing error (denies missed or extra doses)       LABS:   Lab Results   Component Value Date    INR 2.6 01/29/2017    INR 2.2 01/16/2017    INR 3.7 01/06/2017    INR 5.0 12/26/2016        ASSESSMENT:   Therapeutic INR on current dose of warfarin and without bleeding complications.  Since INR is on lower end of therapeutic range, will give one loading dose to achieve mid range therapeutic INR.    PLAN:   1.  Warfarin 7.5mg  instead of 5mg  today, on 01/30/2017, and then resume usual dose of 5 mg daily   2.  Return in 3 weeks (on 02/20/2017).  Pt will let [facility] know if he is unable to go to lab on scheduled date as his wife is expecting to deliver around that time.   3.  Pt verbalized understanding and agreed to above plan.    Royston Bake, PharmD

## 2017-02-18 ENCOUNTER — Other Ambulatory Visit (HOSPITAL_BASED_OUTPATIENT_CLINIC_OR_DEPARTMENT_OTHER): Payer: Self-pay | Admitting: Pharmacist

## 2017-02-18 DIAGNOSIS — Z7901 Long term (current) use of anticoagulants: Secondary | ICD-10-CM

## 2017-02-20 ENCOUNTER — Telehealth (HOSPITAL_BASED_OUTPATIENT_CLINIC_OR_DEPARTMENT_OTHER): Payer: Self-pay

## 2017-02-25 ENCOUNTER — Telehealth (HOSPITAL_BASED_OUTPATIENT_CLINIC_OR_DEPARTMENT_OTHER): Payer: Self-pay

## 2017-02-25 NOTE — Telephone Encounter (Signed)
Walter Rice is 3 days overdue for lab testing related to anticoagulant therapy. Left reminder message by telephone and mailed reminder letter today.

## 2017-02-26 LAB — PROTHROMBIN TIME: Prothrombin INR: 2.8

## 2017-02-27 ENCOUNTER — Telehealth (HOSPITAL_BASED_OUTPATIENT_CLINIC_OR_DEPARTMENT_OTHER): Payer: No Typology Code available for payment source | Admitting: Pharmacist

## 2017-02-27 ENCOUNTER — Other Ambulatory Visit (HOSPITAL_BASED_OUTPATIENT_CLINIC_OR_DEPARTMENT_OTHER): Payer: Self-pay | Admitting: Pharmacist

## 2017-02-27 DIAGNOSIS — Z952 Presence of prosthetic heart valve: Secondary | ICD-10-CM

## 2017-02-27 DIAGNOSIS — Z7901 Long term (current) use of anticoagulants: Secondary | ICD-10-CM

## 2017-02-27 NOTE — Telephone Encounter (Signed)
ANTICOAGULATION TREATMENT PLAN    Indication: MVR; recurrent CVA  Goal INR: 2.5-3.5  Duration of Therapy: chronic    Hemorrhagic Risk Score: 1  Warfarin Tablet Size: 5mg     Relevant Historic Information: CVA 2008; 2005; 2006    Referring Provider: Nadene Rubins    02/08/16: Pacemaker generator change INR goal ~ 2.5 with no heparin bridge.  3/18: stopped smoking ~12/13/16    Anticoagulation Clinical Outcomes 06/06/2014 06/19/2014 12/13/2014   Type of Event Anticoagulation related ED visit Anticoagulation related ED visit Anticoagulation related ED visit   Description of Event hematuria asymptomatic hematuria 06/05/14  gum bleeding after brushing his teeth   Date of Event 06/05/2014 06/05/2014 12/12/2014   INR AT Time of Event/ED visit/Admission 3.5 3.5 3.5   Assessment/Plan/Resolution urology workup pending Continue warfarin at present dose and referral to urology no intervention - resolved spontaneously       SUBJECTIVE:   Walter Rice was last evaluated by [facility] on 01/30/17. His INR was 2.6 and he was instructed to take a 1x loading dose of warfarin and then resume his usual dose and follow up with [facility] in 3 weeks (02/20/17). He missed this appointment as his wife had a baby and had his INR checked on 02/26/17     Today, I spoke with patient's wife who reports Walter Rice has not experienced any signs/symptoms of bleeding, unusual bruising, or signs/symptoms of TIA/CVA. Denies melena and hematuria.     She reports they are both very busy with caring for their newborn son. Diet has been somewhat erratic. Walter Rice had a 24 hour stomach flu approximately 3 days ago (vomiting, diarrhea). No other changes in health.   Denies changes in Rx/OTCs/herbal medications since previous [facility] visit. No upcoming procedures.     OBJECTIVE:     Current  dose:   Warfarin 7.5mg  (instead of 5mg ) on 01/30/17 due to INR of 2.6. Pt then instructed to resume warfarin 5mg  daily.     HOWEVER, Walter Rice has been taking 7.5mg  on Mondays and 5mg  on all other days of  the week.      Relevant medication changes:   none    LABS:   Lab Results   Component Value Date    INR 2.8 02/26/2017    INR 2.6 01/29/2017    INR 2.2 01/16/2017    INR 3.7 01/06/2017    INR 5.0 12/26/2016            ASSESSMENT:   Therapeutic INR in patient who has been taking a slightly higher warfarin dose than prescribed. Will maintain this dose as patient has been taking it for the past month and therefore has reached a steady state.     Would also have expected increased warfarin sensitivity with recent stomach flu.     PLAN:   1. Continue warfarin 7.5mg  on Mondays and 5mg  on all other days of the week (37.5mg /wk)   2. Return in 4 weeks (on 03/26/2017).   3. Walter Rice acknowledged understanding of this plan    Walter Rice, PharmD, PharmD

## 2017-03-26 ENCOUNTER — Telehealth (HOSPITAL_BASED_OUTPATIENT_CLINIC_OR_DEPARTMENT_OTHER): Payer: Self-pay

## 2017-03-31 ENCOUNTER — Telehealth (HOSPITAL_BASED_OUTPATIENT_CLINIC_OR_DEPARTMENT_OTHER): Payer: Self-pay

## 2017-03-31 NOTE — Telephone Encounter (Signed)
Walter Rice is 3 days overdue for lab testing related to anticoagulant therapy. Left reminder message by telephone and mailed reminder letter today.

## 2017-04-02 ENCOUNTER — Telehealth (HOSPITAL_BASED_OUTPATIENT_CLINIC_OR_DEPARTMENT_OTHER): Payer: Self-pay

## 2017-04-02 NOTE — Telephone Encounter (Signed)
Walter Rice is 1 week overdue for lab testing related to anticoagulant therapy.  Left reminder message by telephone.

## 2017-04-09 ENCOUNTER — Telehealth (HOSPITAL_BASED_OUTPATIENT_CLINIC_OR_DEPARTMENT_OTHER): Payer: Self-pay

## 2017-04-09 NOTE — Telephone Encounter (Signed)
Walter Rice is 2 weeks overdue for lab testing related to anticoagulant therapy.  Left reminder message by telephone.

## 2017-04-15 LAB — PROTHROMBIN TIME: Prothrombin INR: 2.3

## 2017-04-16 ENCOUNTER — Other Ambulatory Visit (HOSPITAL_BASED_OUTPATIENT_CLINIC_OR_DEPARTMENT_OTHER): Payer: Self-pay | Admitting: Pharmacist

## 2017-04-16 ENCOUNTER — Telehealth (HOSPITAL_BASED_OUTPATIENT_CLINIC_OR_DEPARTMENT_OTHER): Payer: No Typology Code available for payment source | Admitting: Pharmacist

## 2017-04-16 DIAGNOSIS — Z7901 Long term (current) use of anticoagulants: Secondary | ICD-10-CM

## 2017-04-16 DIAGNOSIS — Z952 Presence of prosthetic heart valve: Secondary | ICD-10-CM
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
## 2017-04-29 ENCOUNTER — Telehealth (HOSPITAL_BASED_OUTPATIENT_CLINIC_OR_DEPARTMENT_OTHER): Payer: No Typology Code available for payment source

## 2017-05-04 ENCOUNTER — Telehealth (HOSPITAL_BASED_OUTPATIENT_CLINIC_OR_DEPARTMENT_OTHER): Payer: Self-pay

## 2017-05-04 NOTE — Telephone Encounter (Signed)
Walter Rice is 3 days overdue for lab testing related to anticoagulant therapy. Left reminder message by telephone and mailed reminder letter today.

## 2017-05-06 ENCOUNTER — Telehealth (HOSPITAL_BASED_OUTPATIENT_CLINIC_OR_DEPARTMENT_OTHER): Payer: Self-pay

## 2017-05-06 NOTE — Telephone Encounter (Signed)
Walter Rice is 1 week overdue for lab testing related to anticoagulant therapy.  Left reminder message by telephone.

## 2017-05-13 ENCOUNTER — Telehealth (HOSPITAL_BASED_OUTPATIENT_CLINIC_OR_DEPARTMENT_OTHER): Payer: Self-pay

## 2017-05-13 NOTE — Telephone Encounter (Signed)
Walter Rice is 2 weeks overdue for lab testing related to anticoagulant therapy.  Left reminder message by telephone.

## 2017-05-20 ENCOUNTER — Telehealth (HOSPITAL_BASED_OUTPATIENT_CLINIC_OR_DEPARTMENT_OTHER): Payer: Self-pay

## 2017-05-20 NOTE — Telephone Encounter (Signed)
Walter Rice is 3 weeks overdue for lab testing related to anticoagulant therapy.  Left reminder message by telephone.

## 2017-05-26 LAB — PROTHROMBIN TIME: Prothrombin INR: 2.3

## 2017-05-27 ENCOUNTER — Other Ambulatory Visit (HOSPITAL_BASED_OUTPATIENT_CLINIC_OR_DEPARTMENT_OTHER): Payer: Self-pay | Admitting: Pharmacist

## 2017-05-27 ENCOUNTER — Telehealth (HOSPITAL_BASED_OUTPATIENT_CLINIC_OR_DEPARTMENT_OTHER): Payer: Self-pay

## 2017-05-27 DIAGNOSIS — Z7901 Long term (current) use of anticoagulants: Secondary | ICD-10-CM

## 2017-05-28 ENCOUNTER — Telehealth (HOSPITAL_BASED_OUTPATIENT_CLINIC_OR_DEPARTMENT_OTHER): Payer: Self-pay | Admitting: Pharmacist

## 2017-05-28 DIAGNOSIS — Z952 Presence of prosthetic heart valve: Secondary | ICD-10-CM

## 2017-05-28 DIAGNOSIS — Z7901 Long term (current) use of anticoagulants: Secondary | ICD-10-CM
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
## 2017-06-04 ENCOUNTER — Telehealth (HOSPITAL_BASED_OUTPATIENT_CLINIC_OR_DEPARTMENT_OTHER): Payer: Self-pay

## 2017-06-09 ENCOUNTER — Telehealth (HOSPITAL_BASED_OUTPATIENT_CLINIC_OR_DEPARTMENT_OTHER): Payer: Self-pay

## 2017-06-09 NOTE — Telephone Encounter (Signed)
Walter Rice is 3 days overdue for lab testing related to anticoagulant therapy. Left reminder message by telephone and mailed reminder letter today.

## 2017-06-10 LAB — PROTHROMBIN TIME: Prothrombin INR: 2

## 2017-06-11 ENCOUNTER — Telehealth (HOSPITAL_BASED_OUTPATIENT_CLINIC_OR_DEPARTMENT_OTHER): Payer: Self-pay

## 2017-06-11 ENCOUNTER — Other Ambulatory Visit (HOSPITAL_BASED_OUTPATIENT_CLINIC_OR_DEPARTMENT_OTHER): Payer: Self-pay | Admitting: Pharmacist

## 2017-06-11 DIAGNOSIS — Z7901 Long term (current) use of anticoagulants: Secondary | ICD-10-CM

## 2017-06-12 ENCOUNTER — Telehealth (HOSPITAL_BASED_OUTPATIENT_CLINIC_OR_DEPARTMENT_OTHER): Payer: 59 | Admitting: Pharmacist

## 2017-06-12 DIAGNOSIS — Z7901 Long term (current) use of anticoagulants: Secondary | ICD-10-CM

## 2017-06-12 DIAGNOSIS — Z952 Presence of prosthetic heart valve: Secondary | ICD-10-CM

## 2017-06-12 NOTE — Telephone Encounter (Addendum)
ANTICOAGULATION TREATMENT PLAN    Indication: MVR; recurrent CVA  Goal INR: 2.5-3.5  Duration of Therapy: chronic    Hemorrhagic Risk Score: 1  Warfarin Tablet Size: 5mg     Relevant Historic Information: CVA 2008; 2005; 2006    Referring Provider: Nadene Rubins    02/08/16: Pacemaker generator change INR goal ~ 2.5 with no heparin bridge.  3/18: stopped smoking ~12/13/16  Aug 2018: started smoking again    SUBJECTIVE:   Walter Rice was last evaluated by [facility] on 8/23.  His INR was 2.3 and he was instructed to increase warfarin from 40mg /wk to 45mg /wk and return in one week.  He is one week overdue.    Walter Rice had INR tested 9/5 and I was able to reach his wife [facility] Rice today to follow up.  She reports that Walter Rice did not likely follow our instructions to take a higher warfarin dose since his last visit as things are hectic at home with a new baby.  She states his diet has been stable in terms of greens. He has had no acute illnesses. No symptoms of TIA or CVA.  She reports he started smoking and vaping again since last visit.    OBJECTIVE:     Current dose:    Warfarin 5mg  MWF & 7.5mg  other days (45mg /wk), increased from 40mg /wk by [facility] on 8/23 for INR 2.3.  HOwever, Walter Rice does not believe [facility] actually increased his dose.  She does not believe he has missed any doses.   He did take 10mg  yesterday 9/6 per [facility] instructions.     Relevant medication changes since last visit:    None per [facility] Rice     LABS:  Lab Results   Component Value Date    INR 2.0 06/10/2017    INR 2.3 05/26/2017    INR 2.3 04/15/2017    INR 2.8 02/26/2017    INR 2.6 01/29/2017    INR 2.2 01/16/2017    INR 4.5 01/22/2012       Lab Results   Component Value Date    HEMATOCRIT 39 02/08/2016    HEMATOCRIT 40 01/16/2015    HEMATOCRIT 42 12/12/2014    HEMATOCRIT 44 06/06/2014    HEMATOCRIT 41 06/05/2014    HEMATOCRIT 40 04/13/2014           ASSESSMENT:   INR remains below target range 2.5-3.5, trending down despite dosage increase which may be  explained by patient taking less warfarin than prescribed according to his wife.  No apparent complications.  Now that he is smoking again, this may decrease warfarin sensitivity also.  Therefore, difficult to determine if low INR if from dosing error, smoking or a combination of both.  Since [facility] Rice feels that dosing error is highly possible, I prefer to continue with intended dosage and we can adjust dose further next week if needed.    PLAN:   1. Warfarin 10mg  taken yesterday 9/6.  Starting today, RESUME intended dosage of 5mg  MWF & 7.5mg  other days=45mg /wk  2. Walter Rice will write down dosing instructions for [facility] Rice  3. Return in 5 days (on 06/17/2017) to make sure INR is trending up appropriately . If INR remains low next week in absence of dosing error, will increase maintenance dose   4. [facility] Rice expresses understanding and is in agreement with this plan.  She was emailed with above plan.     [facility], PharmD

## 2017-06-17 ENCOUNTER — Telehealth (HOSPITAL_BASED_OUTPATIENT_CLINIC_OR_DEPARTMENT_OTHER): Payer: Self-pay

## 2017-06-17 LAB — PROTHROMBIN TIME: Prothrombin INR: 3.1

## 2017-06-18 ENCOUNTER — Other Ambulatory Visit (HOSPITAL_BASED_OUTPATIENT_CLINIC_OR_DEPARTMENT_OTHER): Payer: Self-pay | Admitting: Pharmacist

## 2017-06-18 ENCOUNTER — Telehealth (HOSPITAL_BASED_OUTPATIENT_CLINIC_OR_DEPARTMENT_OTHER): Payer: 59 | Admitting: Pharmacist

## 2017-06-18 DIAGNOSIS — Z7901 Long term (current) use of anticoagulants: Secondary | ICD-10-CM

## 2017-06-18 DIAGNOSIS — Z952 Presence of prosthetic heart valve: Secondary | ICD-10-CM

## 2017-06-18 NOTE — Telephone Encounter (Signed)
ANTICOAGULATION TREATMENT PLAN    Indication: MVR; recurrent CVA  Goal INR: 2.5-3.5  Duration of Therapy: chronic    Hemorrhagic Risk Score: 1  Warfarin Tablet Size: 5mg     Relevant Historic Information: CVA 2008; 2005; 2006    Referring Provider: Nadene Rubins    02/08/16: Pacemaker generator change INR goal ~ 2.5 with no heparin bridge.  3/18: stopped smoking ~12/13/16  Aug 2018: started smoking again    SUBJECTIVE:   Lindsey Gruba was last evaluated by [facility] on 9/7.  His INR was 2.0 thought due to dosing error and he was instructed to take warfarin 10mg  x1 then resume intended dose of 5mg  MWF & 7.5mg  other days with next INR in 5 days.    Today I spoke with Steen's wife [name] to follow up.  She states he has been following our instructions since last visit except he took a higher dose than intended on 9/11 which she states was her error.  Otherwise Aws is starting a new job next week. His diet and activity level remain consistent. No symptoms of CVA or TIA. No unexplained bleeding/bruising.    OBJECTIVE:     Current dose:    Warfarin 5mg  MWF & 7.5mg  other days (he took 10mg  9/6 per ACC recommendation and took 10mg  instead of 7.5mg  on 9/11 [facility])     Relevant medication changes since last visit:    None per [name]     LABS:  Lab Results   Component Value Date    INR 3.1 06/17/2017    INR 2.0 06/10/2017    INR 2.3 05/26/2017    INR 2.3 04/15/2017    INR 2.8 02/26/2017    INR 2.6 01/29/2017       ASSESSMENT:   INR within target range 2.5-3.5 now that patient is taking warfarin as prescribed.  INR may have also been influenced in part by taking a higher dose the day before INR was tested so will check again next week to make sure current maintenance dose is appropriate.    PLAN:   1. CONTINUE warfarin 5mg  MWF & 7.5mg  other days for now  2. Return in 6 days (on 06/24/2017) to follow trend   3. [name] expresses understanding and is in agreement with this plan.    Georgette Dover, PharmD

## 2017-06-24 ENCOUNTER — Telehealth (HOSPITAL_BASED_OUTPATIENT_CLINIC_OR_DEPARTMENT_OTHER): Payer: Self-pay

## 2017-06-24 LAB — PROTHROMBIN TIME: Prothrombin INR: 3.9

## 2017-06-25 ENCOUNTER — Other Ambulatory Visit (HOSPITAL_BASED_OUTPATIENT_CLINIC_OR_DEPARTMENT_OTHER): Payer: Self-pay | Admitting: Pharmacist

## 2017-06-25 ENCOUNTER — Telehealth (HOSPITAL_BASED_OUTPATIENT_CLINIC_OR_DEPARTMENT_OTHER): Payer: 59 | Admitting: Pharmacist

## 2017-06-25 DIAGNOSIS — Z7901 Long term (current) use of anticoagulants: Secondary | ICD-10-CM

## 2017-06-25 DIAGNOSIS — Z952 Presence of prosthetic heart valve: Secondary | ICD-10-CM

## 2017-06-25 NOTE — Telephone Encounter (Signed)
ANTICOAGULATION TREATMENT PLAN    Indication: MVR; recurrent CVA  Goal INR: 2.5-3.5  Duration of Therapy: chronic    Hemorrhagic Risk Score: 1  Warfarin Tablet Size: 5mg     Relevant Historic Information: CVA 2008; 2005; 2006    Referring Provider: Nadene Rubins    02/08/16: Pacemaker generator change INR goal ~ 2.5 with no heparin bridge.  3/18: stopped smoking ~12/13/16  Aug 2018: started smoking again    SUBJECTIVE:    Walter Rice was last evaluated by [facility] on 06/18/2017, with INR = 3.1, and instructed to continue warfarin 5mg  MWF, 7.5mg  other days (45mg /wk), and follow up in 6 days. Patient had a 10mg  dose on both 06/12/2017 (per ACC instructions) and again on 06/16/2017 (dosing error).    There were no warfarin dosing errors.    Spoke with patient's wife, Walter Rice. Patient has had no bruising, no bleeding and no S/Sx of CVA. No melena or hematuria. There are no changes in medications, diet or activity. He is going to start another job in October, and will be working 2 jobs. He continues to smoke, which [facility] Rice is very upset about.    OBJECTIVE:  Warfarin dosing as above.    LABS:  Lab Results   Component Value Date    INR 3.9 06/24/2017    INR 3.1 06/17/2017    INR 2.0 06/10/2017    INR 2.3 05/26/2017    INR 2.3 04/15/2017    INR 2.8 02/26/2017       ASSESSMENT:  INR supratherapeutic possibly due to recent warfarin dosing error. However, his dose requirements are unclear. He was therapeutic on 37.5mg /wk in May, but is now on 45mg /wk, and this may be too high of a dose for him. However, he is now smoking, which should increase is dose requirements. It is unclear when this will reach a steady state. Will not change his dose for now, given recent dosing error, which may be influencing his INR.    PLAN:  1. Warfarin 5mg  today, then resume 5mg  MWF, 7.5mg  other days (45mg /wk).  2. Return in 6 days (on 07/01/2017).  3. [name] expressed understanding of this plan, and the plan was emailed to her.    Forest Becker,  PharmD

## 2017-07-01 ENCOUNTER — Telehealth (HOSPITAL_BASED_OUTPATIENT_CLINIC_OR_DEPARTMENT_OTHER): Payer: Self-pay

## 2017-07-06 ENCOUNTER — Telehealth (HOSPITAL_BASED_OUTPATIENT_CLINIC_OR_DEPARTMENT_OTHER): Payer: Self-pay

## 2017-07-06 NOTE — Telephone Encounter (Signed)
Walter Rice is 3 days overdue for lab testing related to anticoagulant therapy. Left reminder message by telephone and mailed reminder letter today.

## 2017-07-08 ENCOUNTER — Telehealth (HOSPITAL_BASED_OUTPATIENT_CLINIC_OR_DEPARTMENT_OTHER): Payer: Self-pay

## 2017-07-08 NOTE — Telephone Encounter (Signed)
Walter Rice is 1 week overdue for lab testing related to anticoagulant therapy.  Left reminder message by telephone.

## 2017-07-10 ENCOUNTER — Other Ambulatory Visit (HOSPITAL_BASED_OUTPATIENT_CLINIC_OR_DEPARTMENT_OTHER): Payer: Self-pay | Admitting: Pediatric Cardiology

## 2017-07-10 DIAGNOSIS — Q212 Atrioventricular septal defect, unspecified as to partial or complete: Secondary | ICD-10-CM

## 2017-07-15 ENCOUNTER — Telehealth (HOSPITAL_BASED_OUTPATIENT_CLINIC_OR_DEPARTMENT_OTHER): Payer: Self-pay

## 2017-07-15 NOTE — Telephone Encounter (Signed)
Walter Rice is 2 weeks overdue for lab testing related to anticoagulant therapy.  Left reminder message by telephone.

## 2017-07-19 NOTE — Progress Notes (Signed)
Chief Complaint/Reason for Visit  No chief complaint on file.      Patient Active Problem List    Diagnosis Date Noted   . CVA (cerebral vascular accident) (HCC) [I63.9] 09/10/2011   . Sprain of ankle- Right 06/2011 [S93.409A] 06/16/2011   . 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 1999.  2008 gen change.   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        . Low back pain [M54.5] 08/04/2014   . Weakness of trunk musculature [M62.81] 08/04/2014   . Hamstring tightness of both lower extremities [M62.9] 08/04/2014   . Midline low back pain without sciatica [M54.5] 08/04/2014   . 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     A) Status post repair with ventricular septal defect closure in February 1989.   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 Jude prosthetic valve in 1993.       . 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       . 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  TX FROM [place]     . Headache [R51]      TX FROM [place]     . Mitral valve replaced [Z95.2] 04/13/2013   . Chronic anticoagulation [Z79.01] 02/07/2013     [place] [facility] ENROLLED         Review of patient's allergies indicates:  Allergies   Allergen Reactions   . Sulfa Antibiotics Rash       Current Outpatient Prescriptions   Medication Sig Dispense Refill   . Acetaminophen 500 MG Oral Tab Take 2 tablet by mouth every 8 hours if needed to relieve pain 60 Tab prn   . Cyclobenzaprine HCl 5 MG Oral Tab Take 1-2 tabs as needed for muscle spasms of back. Start by taking at night, can increase to three times per day. 60 tablet 3   . METHOCARBAMOL OR Take 1 tablet by mouth 4 times a day as needed.     . Warfarin Sodium 5 MG Oral Tab Warfarin 7.5mg  TUES,THU,SAT,SUN and 5mg  MWF or as directed by  [facility] anticoag clinic       No current facility-administered medications for this visit.        Interval History  Sumter Gozman is a 31 y/o M with a h/o a complete AVSD and PAPVR who underwent VSD atrial and ventricular septation at a year and a half followed by mechanical mitral valve replacement with a 27 mm St. Jude at age 67 in the setting of severe MR.  He also carries a h/o sinus node dysfunction s/p dual chamber PPM with abandoned epicardial leads and a transvenous system in place.      I last saw Doyne a year ago at which point he had been doing well without new onset cardiovascular symptoms.      Jakyron no showed his appointment today.      This encounter was opened in error

## 2017-07-20 ENCOUNTER — Encounter (HOSPITAL_BASED_OUTPATIENT_CLINIC_OR_DEPARTMENT_OTHER): Payer: 59 | Admitting: Interventional Cardiology

## 2017-07-20 ENCOUNTER — Other Ambulatory Visit (HOSPITAL_BASED_OUTPATIENT_CLINIC_OR_DEPARTMENT_OTHER): Payer: 59

## 2017-07-20 DIAGNOSIS — Q212 Atrioventricular septal defect, unspecified as to partial or complete: Secondary | ICD-10-CM

## 2017-07-22 ENCOUNTER — Telehealth (HOSPITAL_BASED_OUTPATIENT_CLINIC_OR_DEPARTMENT_OTHER): Payer: Self-pay

## 2017-07-22 NOTE — Telephone Encounter (Signed)
Walter Rice is 3 weeks overdue for lab testing related to anticoagulant therapy.  Left reminder message by telephone.

## 2017-07-28 LAB — PROTHROMBIN TIME: Prothrombin INR: 1.9

## 2017-07-29 ENCOUNTER — Other Ambulatory Visit (HOSPITAL_BASED_OUTPATIENT_CLINIC_OR_DEPARTMENT_OTHER): Payer: Self-pay | Admitting: Pharmacist

## 2017-07-29 ENCOUNTER — Telehealth (HOSPITAL_BASED_OUTPATIENT_CLINIC_OR_DEPARTMENT_OTHER): Payer: 59 | Admitting: Pharmacist

## 2017-07-29 DIAGNOSIS — Z7901 Long term (current) use of anticoagulants: Secondary | ICD-10-CM

## 2017-07-29 NOTE — Telephone Encounter (Addendum)
ANTICOAGULATION TREATMENT PLAN    Indication: MVR; recurrent CVA  Goal INR: 2.5-3.5  Duration of Therapy: chronic    Hemorrhagic Risk Score: 1  Warfarin Tablet Size: 5mg     Relevant Historic Information: CVA 2008; 2005; 2006    Referring Provider: Nadene Rubins    02/08/16: Pacemaker generator change INR goal ~ 2.5 with no heparin bridge.  3/18: stopped smoking ~12/13/16  Aug 2018: started smoking again    SUBJECTIVE:   Merced Gurrieri was last evaluated by [facility] on 06/25/17 regarding his INR.     INR result: 3.9  Pt was advised to take one reduced dose and then resume his usual dose of warfarin and rescheduled for next INR in one week.    He is overdue for INR and ACC f/u since 07/01/17    Spoke to pt's wife, [name] about his INR result from yesterday.  Wife states pt was unable to get INR blood draw as he has been working overtime and lab is closed on weekends.    Sxs of bleeding/bruising: denies    Signs/sxs of stroke/TIA : denies    Acute illness: reports cold/congestion since yesterday.   He has been more active at his new job (he is working 2 jobs currently).     Appetite/Diet:  Appetite is unchanged.  Diet has been devoid of greens due to warfarin although would prefer to add some greens in his diet.    Med changes (including OTC): none     EtOH use: none.  He continues to smoke tobacco    Upcoming procedures: none     OBJECTIVE:   Present dose: Warfarin dose 5 mg on Mon/Wed/Fri and 7.5 mg on all other days.  Pt missed his dose on 07/21/17 and took 10 mg instead of 5 mg on 07/22/17.        LABS:   Lab Results   Component Value Date    INR 1.9 07/28/2017    INR 3.9 06/24/2017    INR 3.1 06/17/2017    INR 2.0 06/10/2017            ASSESSMENT:   Subtherapeutic INR due to missed dose and recent increase in activity level.  Will give one loading dose and increase weekly dose to achieve therapeutic INR quickly in pt with high risk for CVA.    PLAN:   1.  Take warfarin 5 mg immediately and 10 mg tonight for total  dose of 15 mg today, on 07/29/2017 and then increase weekly dose to 7.5 mg on Monday only and 10 mg on all other days (67.5mg /week)  2.  Return in 6 days (on 08/04/2017).  3.   Wife verbalized understanding and agreed to above plan.    Royston Bake, PharmD

## 2017-08-04 ENCOUNTER — Telehealth (HOSPITAL_BASED_OUTPATIENT_CLINIC_OR_DEPARTMENT_OTHER): Payer: Self-pay

## 2017-08-06 ENCOUNTER — Other Ambulatory Visit: Payer: Self-pay

## 2017-08-07 ENCOUNTER — Telehealth (HOSPITAL_BASED_OUTPATIENT_CLINIC_OR_DEPARTMENT_OTHER): Payer: Self-pay

## 2017-08-07 ENCOUNTER — Inpatient Hospital Stay: Payer: Self-pay

## 2017-08-07 NOTE — Telephone Encounter (Signed)
Walter Rice is 3 days overdue for lab testing related to anticoagulant therapy. Left reminder message by telephone and mailed reminder letter today.

## 2017-08-10 ENCOUNTER — Encounter (HOSPITAL_BASED_OUTPATIENT_CLINIC_OR_DEPARTMENT_OTHER): Payer: Self-pay | Admitting: Pharmacist

## 2017-08-10 NOTE — Progress Notes (Addendum)
HOSPITAL ADMISSION 08/07/17 to 08/08/17 - ANTICOAGULATION SUMMARY    ANTICOAGULATION TREATMENT PLAN    Indication: MVR; recurrent CVA  Goal INR: 2.5-3.5  Duration of Therapy: chronic    Hemorrhagic Risk Score: 1  Warfarin Tablet Size: 5mg     Relevant Historic Information: CVA 2008; 2005; 2006    Referring Provider: Nadene Rubins    02/08/16: Pacemaker generator change INR goal ~ 2.5 with no heparin bridge.  3/18: stopped smoking ~12/13/16  Aug 2018: started smoking again        INTERVAL HISTORY    Walter Rice was last evaluated by [facility] on 07/29/17 .  His INR was 1.9 (goal 2.5-3.5) from 07/28/17 due to missed warfarin dose and he was instructed to take 15mg  on 07/29/17, then increase warfarin dose from 45mg /wk to 7.5mg  on Mondays and 10mg  on all other days of the week (67.5mg /wk) and repeat INR in 6 days (08/04/17)     On 08/07/17 he went [facility] ED and then was transferred to [facility] for transient loss of vision in left eye. CT negative for new CVA. INR was 4.2 upon admission and warfarin was held. Pt was admitted for observation.     ANTICOAGULATION FLOWSHEET     Pt's wife reports at last visit, patient was advised to take 10mg  on 07/29/17, then increase dose from 45mg /wk to 5mg  MF, 7.5mg  on all other days (47.5mg /wk) . This differs from [facility] note which states to take 15mg  on 07/29/17, then increase dose to 7.5mg  M, 10mg  on all other days (this was likely a typo).     DATE INR Warfarin Dose   06/17/17 3.1 5mg  MWF, 7.5mg  all other days    06/24/17 3.9 5mg  on 06/25/17, then 5mg  MWF, 7.5mg  on all other days   (Pt missed dose on 07/21/17 & took 10mg  (instead of 5mg ) on 07/22/17)   07/28/17 1.9 10mg  on 07/29/17, then increase to 5mg  Mondays/Fridays and 7.5mg  on all other days (this differs      08/07/17 3.3 ([place])   4.2 ([place]) Held ADMIT    08/08/17 4.6 Held DISCHARGE   08/09/17  Held    08/10/17  12.5 mg    08/11/17 INR  7.5mg              RELEVANT MEDICATION CHANGES: none     PLAN:  1) I spoke with  patient's wife [name] today who reports patient was instructed to hold warfarin x 3 days by ED provider. I instructed her to given Jadein 12.5mg  tonight (instead of 5mg ) 08/10/17 given warfarin was held for 3 days   2) INR as soon as possible. She will try and have patient go today and result will be available tomorrow.     Willaim Bane, PharmD    ADDENDUM: 08/11/17  I spoke with [name] today. Davaun was unable to check an INR yesterday and he is still at work. He will try to go after work tonight or tomorrow. Instructions were given to continue current warfarin dose of 5mg  MF, 7.5mg  on all other days of the week and check an INR ASAP.     Encarnacion Chu, pharmD

## 2017-08-11 ENCOUNTER — Telehealth (HOSPITAL_BASED_OUTPATIENT_CLINIC_OR_DEPARTMENT_OTHER): Payer: 59

## 2017-08-14 ENCOUNTER — Telehealth (HOSPITAL_BASED_OUTPATIENT_CLINIC_OR_DEPARTMENT_OTHER): Payer: Self-pay

## 2017-08-14 NOTE — Telephone Encounter (Signed)
Walter Rice is 3 days overdue for lab testing related to anticoagulant therapy. Left reminder message by telephone and mailed reminder letter today.

## 2017-08-18 ENCOUNTER — Telehealth (HOSPITAL_BASED_OUTPATIENT_CLINIC_OR_DEPARTMENT_OTHER): Payer: Self-pay

## 2017-08-18 NOTE — Telephone Encounter (Signed)
Walter Rice is 1 week overdue for lab testing related to anticoagulant therapy.  Left reminder message by telephone.

## 2017-08-20 LAB — PROTHROMBIN TIME: Prothrombin INR: 2.7

## 2017-08-25 ENCOUNTER — Other Ambulatory Visit (HOSPITAL_BASED_OUTPATIENT_CLINIC_OR_DEPARTMENT_OTHER): Payer: Self-pay | Admitting: Pharmacist

## 2017-08-25 ENCOUNTER — Telehealth (HOSPITAL_BASED_OUTPATIENT_CLINIC_OR_DEPARTMENT_OTHER): Payer: Self-pay

## 2017-08-25 DIAGNOSIS — Z7901 Long term (current) use of anticoagulants: Secondary | ICD-10-CM

## 2017-08-25 NOTE — Telephone Encounter (Signed)
Walter Rice is 2 weeks overdue for lab testing related to anticoagulant therapy.  Left reminder message by telephone.

## 2017-08-26 ENCOUNTER — Telehealth (HOSPITAL_BASED_OUTPATIENT_CLINIC_OR_DEPARTMENT_OTHER): Payer: Self-pay

## 2017-08-31 ENCOUNTER — Telehealth (HOSPITAL_BASED_OUTPATIENT_CLINIC_OR_DEPARTMENT_OTHER): Payer: 59 | Admitting: Pharmacist

## 2017-08-31 DIAGNOSIS — Z7901 Long term (current) use of anticoagulants: Secondary | ICD-10-CM

## 2017-08-31 NOTE — Telephone Encounter (Signed)
ANTICOAGULATION TREATMENT PLAN    Indication: MVR; recurrent CVA  Goal INR: 2.5-3.5  Duration of Therapy: chronic    Hemorrhagic Risk Score: 1  Warfarin Tablet Size: 5mg     Relevant Historic Information: CVA 2008; 2005; 2006    Referring Provider: Nadene Rubins    02/08/16: Pacemaker generator change INR goal ~ 2.5 with no heparin bridge.  3/18: stopped smoking ~12/13/16  Aug 2018: started smoking again    INTERVAL HISTORY    Walter Rice was last evaluated by [name] on 07/29/17 .  His INR was 1.9 (goal 2.5-3.5) from 07/28/17 due to missed warfarin dose and he was instructed to take 15mg  on 07/29/17, then increase warfarin dose from 45mg /wk to 7.5mg  on Mondays and 10mg  on all other days of the week (67.5mg /wk) and repeat INR in 6 days (08/04/17)     On 08/07/17 he went [facility] and then was transferred to [facility] for transient loss of vision in left eye. CT negative for new CVA. INR was 4.2 upon admission and warfarin was held. Pt was admitted for observation.      SUBJECTIVE:   [facility] Rice was available by phone today.       Today, pt reports no unusual bruising/bleeding.  He has had no further visual disturbance.  He had had "double vision followed by vision going dark."  He   has been able to take all prescribed doses.  Walter Rice acute signs/sx of stroke.    Diet: He eats 3 meals a day usually.   EtOH: None.  He continues to smoke.   Activity: He is much more active at work compared to 3 months ago. His job is physically demanding.  Acute illness: No cough/cold/flu.       Present dose: Warfarin 5mg  MF and 7.5mg  other (took 12.5mg  08/10/17 per [facility] instruction).  This was confirmed by [facility] Rice today over the phone.    DATE INR Warfarin Dose   06/17/17 3.1 5mg  MWF, 7.5mg  all other days    06/24/17 3.9 5mg  on 06/25/17, then 5mg  MWF, 7.5mg  on all other days   (Pt missed dose on 07/21/17 & took 10mg  (instead of 5mg ) on 07/22/17)   07/28/17 1.9 10mg  on 07/29/17, then increase to 5mg  Mondays/Fridays  and 7.5mg  on all other days (this differs      08/07/17 3.3 ([facility])   4.2 ([facility]) Held ADMIT    08/08/17 4.6 Held DISCHARGE   08/09/17  Held    08/10/17  12.5 mg    08/11/17  5mg  MF and 7.5mg  other   08/20/17 2.7 PLAN: 5mg  MF and 7.5mg  other   09/03/17 INR          OBJECTIVE:     Relevant medication changes: None.      ASSESSMENT:   INR therapeutic after loading dose provided to off set ER/inpatient provider- directed warfarin interruption 08/07/17-08/09/17.  Expect his usual warfarin maintenance dose to still be appropriate for him.  Prefer to repeat his INR this week to ensure therapeutic INR on warfarin 47.5mg /wk.     Low INR 07/28/17 may have been explained by increased activity level since early fall with new employment.    PLAN:   1. Continue warfarin 5mg  MF and 7.5mg  other (47.5mg /wk)  2. Return in 3 days (on 09/03/2017). [facility]R @ [facility]  3. [facility] Rice verbally expressed understanding of the above plan.        Dia Sitter, PharmD

## 2017-09-03 ENCOUNTER — Telehealth (HOSPITAL_BASED_OUTPATIENT_CLINIC_OR_DEPARTMENT_OTHER): Payer: Self-pay

## 2017-09-08 ENCOUNTER — Telehealth (HOSPITAL_BASED_OUTPATIENT_CLINIC_OR_DEPARTMENT_OTHER): Payer: Self-pay

## 2017-09-08 NOTE — Telephone Encounter (Signed)
Walter Rice is 3 days overdue for lab testing related to anticoagulant therapy. Left reminder message by telephone and mailed reminder letter today.

## 2017-09-10 ENCOUNTER — Telehealth (HOSPITAL_BASED_OUTPATIENT_CLINIC_OR_DEPARTMENT_OTHER): Payer: Self-pay

## 2017-09-10 NOTE — Telephone Encounter (Signed)
Walter Rice is 1 week overdue for lab testing related to anticoagulant therapy.  Left reminder message by telephone.

## 2017-09-17 ENCOUNTER — Telehealth (HOSPITAL_BASED_OUTPATIENT_CLINIC_OR_DEPARTMENT_OTHER): Payer: Self-pay

## 2017-09-17 NOTE — Telephone Encounter (Signed)
Walter Rice is 2 weeks overdue for lab testing related to anticoagulant therapy.  Left reminder message by telephone.

## 2017-09-24 ENCOUNTER — Telehealth (HOSPITAL_BASED_OUTPATIENT_CLINIC_OR_DEPARTMENT_OTHER): Payer: Self-pay

## 2017-09-24 NOTE — Telephone Encounter (Signed)
Walter Rice is 3 weeks overdue for lab testing related to anticoagulant therapy.  Left reminder message by telephone.

## 2017-09-25 ENCOUNTER — Telehealth (HOSPITAL_BASED_OUTPATIENT_CLINIC_OR_DEPARTMENT_OTHER): Payer: Self-pay | Admitting: Interventional Cardiology

## 2017-09-25 NOTE — Telephone Encounter (Signed)
-----   Message from Jory Ee sent at 09/24/2017 10:37 AM PST -----  Regarding: Follow Up Visit  Hi [place]/[facility]iana!    Could we please call this pt and schedule a follow up appointment.  He was supposed to follow up 10/18 but we lost track of him.  No need for an echo at this time because he had one completed at a different facility.    Thank you,  Ame

## 2017-09-25 NOTE — Telephone Encounter (Signed)
Per RN Ame - need to schedule f/u with either Julio Alm / Ria Comment / [facility] (please ask for pt preference).     No need for testing.

## 2017-09-28 ENCOUNTER — Other Ambulatory Visit (HOSPITAL_BASED_OUTPATIENT_CLINIC_OR_DEPARTMENT_OTHER): Payer: Self-pay | Admitting: Pharmacist

## 2017-09-28 DIAGNOSIS — Z7901 Long term (current) use of anticoagulants: Secondary | ICD-10-CM

## 2017-09-28 MED ORDER — WARFARIN SODIUM 5 MG OR TABS
ORAL_TABLET | ORAL | 0 refills | Status: DC
Start: 2017-09-28 — End: 2017-11-02

## 2017-10-01 LAB — PROTHROMBIN TIME: Prothrombin INR: 1.6

## 2017-10-02 ENCOUNTER — Other Ambulatory Visit (HOSPITAL_BASED_OUTPATIENT_CLINIC_OR_DEPARTMENT_OTHER): Payer: Self-pay | Admitting: Pharmacist

## 2017-10-02 ENCOUNTER — Telehealth (HOSPITAL_BASED_OUTPATIENT_CLINIC_OR_DEPARTMENT_OTHER): Payer: 59 | Admitting: Pharmacist

## 2017-10-02 DIAGNOSIS — Z7901 Long term (current) use of anticoagulants: Secondary | ICD-10-CM

## 2017-10-02 NOTE — Telephone Encounter (Signed)
ANTICOAGULATION TREATMENT PLAN    Indication: MVR; recurrent CVA  Goal INR: 2.5-3.5  Duration of Therapy: chronic    Hemorrhagic Risk Score: 1  Warfarin Tablet Size: 5mg     Relevant Historic Information: CVA 2008; 2005; 2006    Referring Provider: Nadene Rubins    02/08/16: Pacemaker generator change INR goal ~ 2.5 with no heparin bridge.  3/18: stopped smoking ~12/13/16  Aug 2018: started smoking again    INTERVAL HISTORY:    Walter Rice was last evaluated by [facility] on 07/29/17. His INR was 1.9 (goal 2.5-3.5) from 07/28/17 due to missed warfarin dose and he was instructed to take 15mg  on 07/29/17, then increase warfarin dose from 45mg /wk to 7.5mg  on Mondays and 10mg  on all other days of the week (67.5mg /wk) and repeat INR in 6 days (08/04/17).    On 08/07/17 he went [facility] ED and then was transferred to [place] for transient loss of vision in left eye. CT negative for new CVA. INR was 4.2 upon admission and warfarin was held. Pt was admitted for observation.    SUBJECTIVE:    Walter Rice was last evaluated by [facility] on 08/31/2017, with INR = 2.7 (from 08/20/2017), and instructed to continue warfarin 5mg  MF, 7.5mg  other days (47.5mg /wk), and follow up in 3 days (2 weeks after his last INR). He is 1 month overdue for his INR evaluation.    Patient missed his 7.5mg  warfarin dose 2 days ago, on 09/30/2017.    Patient reports no bruising, no bleeding and no S/Sx of CVA. Has not had any recurrence of visual symptoms which were reported at last ED visit. Denies melena or hematuria. There are no changes in medications or activity. He has been sick since 09/25/2017, with fever (no temperature taken), cough, runny nose, diarrhea x1 on 09/29/2017, and nausea yesterday. He is not eating very much, and still feels sick, although he is currently at work. He has been using acetaminophen for relief.    OBJECTIVE:  DATE INR Warfarin Dose   06/17/17 3.1 5mg  MWF, 7.5mg  all other days    06/24/17 3.9 5mg   on 06/25/17, then 5mg  MWF, 7.5mg  on all other days   (Pt missed dose on 07/21/17 & took 10mg  (instead of 5mg ) on 07/22/17)   07/28/17 1.9 10mg  on 07/29/17, then increase to 5mg  Mondays/Fridays and 7.5mg  on all other days (this differs    08/07/17 3.3 ([place])  4.5 ([place]) HELD   ADMIT   08/08/17 4.6 HELD   DISCHARGE   08/09/17  HELD   08/10/17  12.5mg    08/11/17  5mg  MF, 7.5mg  AOD   08/20/17 2.7 5mg  MF, 7.5mg  AOD   10/01/17 1.6 PLAN: 10mg  x2, 5mg  MF, 7.5mg  AOD   10/09/17 INR        ASSESSMENT:  INR subtherapeutic due to missed warfarin dose, although patient is likely more warfarin sensitive given current illness. Will reload with warfarin, and recheck INR next week.    PLAN:  1. Warfarin 10mg  x2 days, then resume 5mg  MF, 7.5mg  other days (47.5mg /wk).  2. Return in 7 days (on 10/09/2017).  3. Patient expressed understanding of this plan.    Forest Becker, PharmD

## 2017-10-07 ENCOUNTER — Telehealth (HOSPITAL_BASED_OUTPATIENT_CLINIC_OR_DEPARTMENT_OTHER): Payer: Self-pay

## 2017-10-09 ENCOUNTER — Telehealth (HOSPITAL_BASED_OUTPATIENT_CLINIC_OR_DEPARTMENT_OTHER): Payer: Self-pay | Admitting: Interventional Cardiology

## 2017-10-09 ENCOUNTER — Telehealth (HOSPITAL_BASED_OUTPATIENT_CLINIC_OR_DEPARTMENT_OTHER): Payer: Self-pay

## 2017-10-09 LAB — PROTHROMBIN TIME: Prothrombin INR: 3.2

## 2017-10-09 NOTE — Telephone Encounter (Signed)
-----   Message from Jory Ee sent at 10/09/2017  7:20 AM PST -----  Regarding: RE: Follow Up Visit  Yes, he still needs a visit please.  His last office visit was 10/17.  The notation from 07/20/17 was a telephone encounter from [place].    Thanks!  Ame  ----- Message -----  From: Joneen Caraway  Sent: 10/08/2017   6:56 PM  To: Ame Dohm  Subject: FW: Follow Up Visit                              Hey Ame,  Do we still need next available office visit? I see that pt was seen on 07/20/17 with Ria Comment?      [place]      ----- Message -----  From: Joneen Caraway  Sent: 09/25/2017   7:11 PM  To: Joneen Caraway  Subject: FW: Follow Up Visit                              Need to schedule next avail ov, no testing needed.  Made 1st attempt.      ----- Message -----  From: Jory Ee  Sent: 09/24/2017  10:37 AM  To: Bishop Dublin, #  Subject: Follow Up Visit                                  Hi Savannah/Juliana!    Could we please call this pt and schedule a follow up appointment.  He was supposed to follow up 10/18 but we lost track of him.  No need for an echo at this time because he had one completed at a different facility.    Thank you,  Ame

## 2017-10-09 NOTE — Telephone Encounter (Signed)
Made 2nd attempt to schedule next available office visit, no testing needed. LVM to call us back to schedule.

## 2017-10-12 ENCOUNTER — Other Ambulatory Visit (HOSPITAL_BASED_OUTPATIENT_CLINIC_OR_DEPARTMENT_OTHER): Payer: Self-pay | Admitting: Pharmacist

## 2017-10-12 ENCOUNTER — Telehealth (HOSPITAL_BASED_OUTPATIENT_CLINIC_OR_DEPARTMENT_OTHER): Payer: Self-pay | Admitting: Pharmacist

## 2017-10-12 DIAGNOSIS — Z7901 Long term (current) use of anticoagulants: Secondary | ICD-10-CM

## 2017-10-12 NOTE — Telephone Encounter (Signed)
ANTICOAGULATION TREATMENT PLAN    Indication: MVR; recurrent CVA  Goal INR: 2.5-3.5  Duration of Therapy: chronic    Hemorrhagic Risk Score: 1  Warfarin Tablet Size: 5mg     Relevant Historic Information: CVA 2008; 2005; 2006    Referring Provider: Nadene Rubins    02/08/16: Pacemaker generator change INR goal ~ 2.5 with no heparin bridge.  3/18: stopped smoking ~12/13/16  Aug 2018: started smoking again    Anticoagulation Clinical Outcomes 06/06/2014 06/19/2014 12/13/2014   Type of Event Anticoagulation related ED visit Anticoagulation related ED visit Anticoagulation related ED visit   Description of Event hematuria asymptomatic hematuria 06/05/14  gum bleeding after brushing his teeth   Date of Event 06/05/2014 06/05/2014 12/12/2014   INR AT Time of Event/ED visit/Admission 3.5 3.5 3.5   Assessment/Plan/Resolution urology workup pending Continue warfarin at present dose and referral to urology no intervention - resolved spontaneously       SUBJECTIVE:  Ronda Chiarella was last evaluated by [facility] on 10/02/2017 at which time the INR was 1.6 (goal 2.5-3.5). He was instructed to take warfarin booster doses, then resume 5mg  MF, 7.5mg  all other days.  He was scheduled for follow-up appt in 1 week.    Today I spoke with [name] (wife) regarding Katie's IT consultant. She denies any unusual bruising or bleeding, and denies signs/sxs of TIA/stroke on Jaegar's behalf. He has recovered from the cold that he reported last week. Appetite is good but he mostly avoids greens because he does not want them to interfere with warfarin. No alcohol reported.  No changes to other medications.     WARFARIN DOSE:   Warfarin as below.  No warfarin dosing errors reported.    OBJECTIVE:  DATE INR Warfarin Dose   06/17/17 3.1 5mg  MWF, 7.5mg  all other days    06/24/17 3.9 5mg  on 06/25/17, then 5mg  MWF, 7.5mg  on all other days   (Pt missed dose on 07/21/17 & took 10mg  (instead of 5mg ) on 07/22/17)   07/28/17 1.9 10mg  on 07/29/17, then increase to 5mg   Mondays/Fridays and 7.5mg  on all other days (this differs    08/07/17 3.3 ([place])  4.5 ([place]) HELD   ADMIT   08/08/17 4.6 HELD   DISCHARGE   08/09/17  HELD   08/10/17  12.5mg    08/11/17  5mg  MF, 7.5mg  AOD   08/20/17 2.7 5mg  MF, 7.5mg  AOD   10/01/17 1.6 10mg  x2, 5mg  MF, 7.5mg  AOD   10/09/17 3.2 PLAN: cont 5mg  MF, 7.5mg  others   10/16/17 INR             ASSESSMENT:   Therapeutic INR following warfarin booster doses.  Will monitor more closely for now to follow trend. I advised that this would be a good time to add greens into the diet, starting with 1-2 servings per week.    PLAN:   1.  Continue current warfarin dose of 5mg  MF, 7.5mg  all others.  2.  May incorporate 2 servings of greens (dietary Vitamin K) per week- try to be consistent.  3.  Return in 4 days (on 10/16/2017), which is 7 days from last blood draw.  4.  [name] (wife) expressed understanding of plan outlined above. Emailed plan to [employer].    Alfred Levins, PharmD

## 2017-10-13 ENCOUNTER — Telehealth (HOSPITAL_BASED_OUTPATIENT_CLINIC_OR_DEPARTMENT_OTHER): Payer: Self-pay | Admitting: Interventional Cardiology

## 2017-10-13 ENCOUNTER — Encounter (HOSPITAL_BASED_OUTPATIENT_CLINIC_OR_DEPARTMENT_OTHER): Payer: Self-pay | Admitting: Interventional Cardiology

## 2017-10-13 NOTE — Telephone Encounter (Signed)
Made 3rd attempt to get pt scheduled next available ov, no testing needed. Will send pt letter.

## 2017-10-13 NOTE — Telephone Encounter (Signed)
-----   Message from [facility] sent at 10/09/2017  8:52 AM PST -----  Regarding: FW: Follow Up Visit  Made 2nd attempt to schedule next available office visit, no testing needed. LVM to call us back to schedule.      ----- Message -----  From: Jory Ee  Sent: 10/09/2017   7:20 AM  To: Elvina Sidle, RN, Joneen Caraway  Subject: RE: Follow Up Visit                              Yes, he still needs a visit please.  His last office visit was 10/17.  The notation from 07/20/17 was a telephone encounter from [place].    Thanks!  Ame  ----- Message -----  From: Joneen Caraway  Sent: 10/08/2017   6:56 PM  To: Ame Dohm  Subject: FW: Follow Up Visit                              Hey Ame,  Do we still need next available office visit? I see that pt was seen on 07/20/17 with Ria Comment?      [place]      ----- Message -----  From: Joneen Caraway  Sent: 09/25/2017   7:11 PM  To: Joneen Caraway  Subject: FW: Follow Up Visit                              Need to schedule next avail ov, no testing needed.  Made 1st attempt.      ----- Message -----  From: Jory Ee  Sent: 09/24/2017  10:37 AM  To: Bishop Dublin, #  Subject: Follow Up Visit                                  Hi Savannah/Juliana!    Could we please call this pt and schedule a follow up appointment.  He was supposed to follow up 10/18 but we lost track of him.  No need for an echo at this time because he had one completed at a different facility.    Thank you,  Ame

## 2017-10-16 ENCOUNTER — Telehealth (HOSPITAL_BASED_OUTPATIENT_CLINIC_OR_DEPARTMENT_OTHER): Payer: Self-pay

## 2017-10-21 ENCOUNTER — Telehealth (HOSPITAL_BASED_OUTPATIENT_CLINIC_OR_DEPARTMENT_OTHER): Payer: Self-pay

## 2017-10-21 NOTE — Telephone Encounter (Signed)
Walter Rice is 3 days overdue for lab testing related to anticoagulant therapy. Left reminder message by telephone and mailed reminder letter today.

## 2017-10-23 ENCOUNTER — Telehealth (HOSPITAL_BASED_OUTPATIENT_CLINIC_OR_DEPARTMENT_OTHER): Payer: Self-pay

## 2017-10-23 NOTE — Telephone Encounter (Signed)
Walter Rice is 1 week overdue for lab testing related to anticoagulant therapy.  Left reminder message by telephone.

## 2017-10-29 LAB — PROTHROMBIN TIME: Prothrombin INR: 1.9

## 2017-10-30 ENCOUNTER — Telehealth (HOSPITAL_BASED_OUTPATIENT_CLINIC_OR_DEPARTMENT_OTHER): Payer: Self-pay

## 2017-10-30 ENCOUNTER — Other Ambulatory Visit (HOSPITAL_BASED_OUTPATIENT_CLINIC_OR_DEPARTMENT_OTHER): Payer: Self-pay | Admitting: Pharmacist

## 2017-10-30 DIAGNOSIS — Z7901 Long term (current) use of anticoagulants: Secondary | ICD-10-CM

## 2017-11-02 ENCOUNTER — Telehealth (HOSPITAL_BASED_OUTPATIENT_CLINIC_OR_DEPARTMENT_OTHER): Payer: Self-pay | Admitting: Pharmacist

## 2017-11-02 DIAGNOSIS — Z7901 Long term (current) use of anticoagulants: Secondary | ICD-10-CM

## 2017-11-02 MED ORDER — WARFARIN SODIUM 5 MG OR TABS
ORAL_TABLET | ORAL | 3 refills | Status: DC
Start: 2017-11-02 — End: 2018-04-13

## 2017-11-02 NOTE — Telephone Encounter (Signed)
ANTICOAGULATION TREATMENT PLAN    Indication: MVR; recurrent CVA  Goal INR: 2.5-3.5  Duration of Therapy: chronic    Hemorrhagic Risk Score: 1  Warfarin Tablet Size: 5mg     Relevant Historic Information: CVA 2008; 2005; 2006    Referring Provider: Nadene Rubins    02/08/16: Pacemaker generator change INR goal ~ 2.5 with no heparin bridge.  3/18: stopped smoking ~12/13/16  Aug 2018: started smoking again     SUBJECTIVE:   Walter Rice was last evaluated by [facility] on 10/12/17. His INR was 3.2 frp, 10/09/17 and he was instructed to continue warfarin 5mg  MF and 7.5mg  all other days.     He is 2 weeks overdue for INR evaluation and ACC assessment.       Today, pt reports no unusual bruising/bleeding.  Fed has been able to take all prescribed doses.  He denies acute signs/sx of stroke.    Diet: He has added a few servings a week of green vegetables over the last month.  Activity: Busy at work and home.  Acute illness: He had 2 days of diarrhea. No fever or BRBPR.      Present dose: Warfarin 5mg  MF and 7.5mg  other.  He took 10mg  10/30/17 and 10mg  10/31/17 and then resumed warfarin 5mg  MF and 7.5mg  other.    OBJECTIVE:     Relevant medication changes: He is not using muscle relaxants.  Med list updated/reviewed with patient today.    DATE INR Warfarin Dose   06/17/17 3.1 5mg  MWF, 7.5mg  all other days    06/24/17 3.9 5mg  on 06/25/17, then 5mg  MWF, 7.5mg  on all other days   (Pt missed dose on 07/21/17 & took 10mg  (instead of 5mg ) on 07/22/17)   07/28/17 1.9 10mg  on 07/29/17, then increase to 5mg  Mondays/Fridays and 7.5mg  on all other days (this differs    08/07/17 3.3 ([place])  4.5 ([place]) HELD ADMIT   08/08/17 4.6 HELD DISCHARGE   08/09/17  HELD   08/10/17  12.5mg    08/11/17  5mg  MF, 7.5mg  AOD   08/20/17 2.7 5mg  MF, 7.5mg  AOD   10/01/17 1.6 10mg  x2, 5mg  MF, 7.5mg  AOD   10/09/17 3.2 5mg  MF, 7.5mg  others   10/30/17 1.9    11/16/17 INR          ASSESSMENT:   INR below goal likely explained by added vitamin K foods in  his diet the last few weeks "in moderation" he notes.  Pt would benefit from a mild increase in maintenance dose to aim for mid-therapeutic INR.    PLAN:   1. Increase to warfarin 5mg  Mon and 7.5mg  other (a 5% increase to 50mg  /wk)  2. Return in 2 weeks (on 11/16/2017). INR @ [facility].  3. Nilan verbally expressed understanding of the above plan.  Plan sent to [facility] as well by e-mail.        Dia Sitter, PharmD

## 2017-11-16 ENCOUNTER — Telehealth (HOSPITAL_BASED_OUTPATIENT_CLINIC_OR_DEPARTMENT_OTHER): Payer: Self-pay

## 2017-11-19 ENCOUNTER — Telehealth (HOSPITAL_BASED_OUTPATIENT_CLINIC_OR_DEPARTMENT_OTHER): Payer: Self-pay

## 2017-11-19 NOTE — Telephone Encounter (Signed)
Walter Rice is 3 days overdue for lab testing related to anticoagulant therapy. Left reminder message by telephone and mailed reminder letter today.

## 2017-11-25 ENCOUNTER — Telehealth (HOSPITAL_BASED_OUTPATIENT_CLINIC_OR_DEPARTMENT_OTHER): Payer: Self-pay

## 2017-11-25 NOTE — Telephone Encounter (Signed)
Walter Rice is 1 week overdue for lab testing related to anticoagulant therapy.  Left reminder message by telephone.

## 2017-11-30 ENCOUNTER — Telehealth (HOSPITAL_BASED_OUTPATIENT_CLINIC_OR_DEPARTMENT_OTHER): Payer: Self-pay

## 2017-11-30 NOTE — Telephone Encounter (Signed)
Walter Rice is 2 weeks overdue for lab testing related to anticoagulant therapy.  Left reminder message by telephone.

## 2017-12-07 ENCOUNTER — Telehealth (HOSPITAL_BASED_OUTPATIENT_CLINIC_OR_DEPARTMENT_OTHER): Payer: Self-pay

## 2017-12-07 NOTE — Telephone Encounter (Signed)
Walter Rice is 3 weeks overdue for lab testing related to anticoagulant therapy.  Left reminder message by telephone.

## 2017-12-16 ENCOUNTER — Encounter (HOSPITAL_BASED_OUTPATIENT_CLINIC_OR_DEPARTMENT_OTHER): Payer: Self-pay

## 2017-12-16 NOTE — Progress Notes (Signed)
Walter Rice is 30 days overdue for lab testing related to anticoagulant therapy.  Reminder letter mailed to patient today.

## 2018-01-19 ENCOUNTER — Encounter (HOSPITAL_BASED_OUTPATIENT_CLINIC_OR_DEPARTMENT_OTHER): Payer: Self-pay

## 2018-01-19 NOTE — Progress Notes (Signed)
Walter Rice is 60 days overdue for lab testing related to anticoagulant therapy.  Reminder letter mailed to patient today.

## 2018-02-04 ENCOUNTER — Other Ambulatory Visit (HOSPITAL_BASED_OUTPATIENT_CLINIC_OR_DEPARTMENT_OTHER): Payer: Self-pay | Admitting: Pharmacist

## 2018-02-04 DIAGNOSIS — Z7901 Long term (current) use of anticoagulants: Secondary | ICD-10-CM

## 2018-03-08 ENCOUNTER — Inpatient Hospital Stay: Payer: Self-pay

## 2018-03-09 ENCOUNTER — Encounter (HOSPITAL_BASED_OUTPATIENT_CLINIC_OR_DEPARTMENT_OTHER): Payer: Self-pay | Admitting: Pharmacist

## 2018-03-09 DIAGNOSIS — Z7901 Long term (current) use of anticoagulants: Secondary | ICD-10-CM

## 2018-03-09 MED ORDER — ENOXAPARIN SODIUM 80 MG/0.8ML IJ SOSY
80.0000 mg | PREFILLED_SYRINGE | Freq: Two times a day (BID) | INTRAMUSCULAR | 0 refills | Status: DC
Start: 2018-03-09 — End: 2018-03-23

## 2018-03-09 NOTE — Progress Notes (Signed)
ED VISIT  03/08/18  - ANTICOAGULATION SUMMARY    ANTICOAGULATION TREATMENT PLAN    Indication: MVR; recurrent CVA  Goal INR: 2.5-3.5  Duration of Therapy: chronic    Hemorrhagic Risk Score: 1  Warfarin Tablet Size: 5mg     Relevant Historic Information: CVA 2008; 2005; 2006    Referring Provider: Nadene Rubins    02/08/16: Pacemaker generator change INR goal ~ 2.5 with no heparin bridge.  3/18: stopped smoking ~12/13/16  Aug 2018: started smoking again    INTERVAL HISTORY    Walter Rice was last evaluated by [facility] on 11/02/17 .  His  INR was 1.9 (goal 2.5-3.5) and he was instructed to increase his warfarin dose and follow up with ACC in 2 weeks (11/16/17). He missed this appointment and is now 60 DAYS OVERDUE.     On 03/08/18, he presented to [facility] ED complaining of right foot and ankle pain that started after an injury 3 days prior. Imaging showed possible fracture to the distal fibula. He was discharged home with a splint and crutches and a rx for Norco. INR or other labs not drawn.     Weight per ED note was 79.4 kg (175 lb)     DATE INR Warfarin Dose   06/17/17 3.1 5mg  MWF, 7.5mg  all other days    06/24/17 3.9 5mg  on 06/25/17, then 5mg  MWF, 7.5mg  on all other days   (Pt missed dose on 07/21/17 & took 10mg  (instead of 5mg ) on 07/22/17)   07/28/17 1.9 10mg  on 07/29/17, then increase to 5mg  Mondays/Fridays and 7.5mg  on all other days (this differs    08/07/17 3.3 ([place])  4.5 ([place]) HELD ADMIT   08/08/17 4.6 HELD DISCHARGE   08/09/17  HELD   08/10/17  12.5mg    08/11/17  5mg  MF, 7.5mg  AOD   08/20/17 2.7 5mg  MF, 7.5mg  AOD   10/01/17 1.6 10mg  x2, 5mg  MF, 7.5mg  AOD   10/09/17 3.2 5mg  MF, 7.5mg  others   10/30/17 1.9 Plan: 5mg  Mondays and 7.5mg  AOD      Hematocrit   Date Value Ref Range Status   02/08/2016 39 38 - 50 % Final   01/16/2015 40 38 - 50 % Final   12/12/2014 42 38 - 50 % Final       Platelet Count   Date Value Ref Range Status   02/08/2016 202 150 - 400 10*3/uL Final   01/16/2015 202 150 - 400  10*3/uL Final   12/12/2014 218 150 - 400 THOU/uL Final       Creatinine   Date Value Ref Range Status   02/08/2016 1.06 0.51 - 1.18 mg/dL Final   16/07/9603 5.40 0.51 - 1.18 mg/dL Final   98/08/9146 8.29 0.51 - 1.18 mg/dL Final             PLAN:  1) I spoke with patient's wife today. She reports patient has not had any warfarin in 5 days. INR is likely normalized. Given patients MVR and hx of recurrent CVA, I recommended bridging with LMWH. Unfortunately, there are no recent labs for patient. However, his weight at the ED was 79kg.   2) Initiate enoxaparin 80mg  SQ q 12 hours (1mg /kg) and continue until INR is therapeutic. I called in a new rx to [name] pharmacy.   3) Resume warfarin today at 10mg  x 3 doses (loading doses), then resume warfarin at 5mg  on Mondays and 7.5mg  on all other days   4) Pt will check an INR/CBC/Creatinine on 03/11/18,  but results will not be available until 03/12/18.   5) Pt has instructions to take warfarin 10mg  on 03/11/18 also.   6) Plan emailed to patient's wife Walter Rice, PharmD

## 2018-03-10 ENCOUNTER — Other Ambulatory Visit (HOSPITAL_BASED_OUTPATIENT_CLINIC_OR_DEPARTMENT_OTHER): Payer: Self-pay | Admitting: Pharmacist

## 2018-03-10 DIAGNOSIS — Z7901 Long term (current) use of anticoagulants: Secondary | ICD-10-CM

## 2018-03-11 ENCOUNTER — Telehealth (HOSPITAL_BASED_OUTPATIENT_CLINIC_OR_DEPARTMENT_OTHER): Payer: Self-pay

## 2018-03-17 ENCOUNTER — Telehealth (HOSPITAL_BASED_OUTPATIENT_CLINIC_OR_DEPARTMENT_OTHER): Payer: Self-pay

## 2018-03-22 ENCOUNTER — Encounter (HOSPITAL_BASED_OUTPATIENT_CLINIC_OR_DEPARTMENT_OTHER): Payer: Self-pay | Admitting: Unknown Physician Specialty

## 2018-03-22 LAB — PROTHROMBIN TIME: Prothrombin INR: 1.4

## 2018-03-22 NOTE — Progress Notes (Signed)
Walter Rice is 1 week overdue for lab testing related to anticoagulant therapy.  Left reminder message by telephone.     Also sending letter for missed visit. Never sent due to appt date was changed

## 2018-03-23 ENCOUNTER — Telehealth (HOSPITAL_BASED_OUTPATIENT_CLINIC_OR_DEPARTMENT_OTHER): Payer: Self-pay | Admitting: Pharmacist

## 2018-03-23 ENCOUNTER — Other Ambulatory Visit (HOSPITAL_BASED_OUTPATIENT_CLINIC_OR_DEPARTMENT_OTHER): Payer: Self-pay | Admitting: Pharmacist

## 2018-03-23 DIAGNOSIS — Z7901 Long term (current) use of anticoagulants: Secondary | ICD-10-CM

## 2018-03-23 MED ORDER — ENOXAPARIN SODIUM 80 MG/0.8ML IJ SOSY
80.0000 mg | PREFILLED_SYRINGE | Freq: Two times a day (BID) | INTRAMUSCULAR | 0 refills | Status: DC
Start: 2018-03-23 — End: 2018-03-28

## 2018-03-23 NOTE — Telephone Encounter (Signed)
ANTICOAGULATION TREATMENT PLAN    Indication: MVR; recurrent CVA  Goal INR: 2.5-3.5  Duration of Therapy: chronic    Hemorrhagic Risk Score: 1  Warfarin Tablet Size: 5mg     Relevant Historic Information: CVA 2008; 2005; 2006    Referring Provider: Nadene Rubins    02/08/16: Pacemaker generator change INR goal ~ 2.5 with no heparin bridge.  3/18: stopped smoking ~12/13/16  Aug 2018: started smoking again    Anticoagulation Clinical Outcomes 06/06/2014 06/19/2014 12/13/2014   Type of Event Anticoagulation related ED visit Anticoagulation related ED visit Anticoagulation related ED visit   Description of Event hematuria asymptomatic hematuria 06/05/14  gum bleeding after brushing his teeth   Date of Event 06/05/2014 06/05/2014 12/12/2014   INR AT Time of Event/ED visit/Admission 3.5 3.5 3.5   Assessment/Plan/Resolution urology workup pending Continue warfarin at present dose and referral to urology no intervention - resolved spontaneously       SUBJECTIVE:    Walter Rice was last evaluated by [facility] on 03/09/2018, with INR = 1.9, and instructed to take warfarin 10mg  x3 days, then resume 5mg  M, 7.5mg  other days (50mg /wk), start enoxaparin 80mg  q12h, and follow up in 2 days. Patient is 12 days overdue for his INR evaluation.    Per patient's wife, he has missed at least 2 warfarin doses, on 03/18/2018, and likely 03/19/2018.    Spoke with patient's wife, Walter Rice. Patient has had no unusual bruising, no bleeding and no S/Sx of CVA. No melena or hematuria. There are no changes in medications, diet or activity. He used the entire supply of enoxaparin from his last ACC visit. He tolerated the injections well. Patient states they sting. He has been cleared to return to work s/p leg fracture. Patient states he just seems to forget to take his warfarin sometimes. Open to using his phone alarm for reminders.    OBJECTIVE:  DATE INR Warfarin Dose   06/17/17 3.1 5mg  MWF, 7.5mg  all other days    06/24/17 3.9 5mg  on 06/25/17, then 5mg  MWF,  7.5mg  on all other days   (Pt missed dose on 07/21/17 & took 10mg  (instead of 5mg ) on 07/22/17)   07/28/17 1.9 10mg  on 07/29/17, then increase to 5mg  Mondays/Fridays and 7.5mg  on all other days (this differs    08/07/17 3.3 ([place])  4.5 ([place]) HELD ADMIT   08/08/17 4.6 HELD DISCHARGE   08/09/17  HELD   08/10/17  12.5mg    08/11/17  5mg  MF, 7.5mg  AOD   08/20/17 2.7 5mg  MF, 7.5mg  AOD   10/01/17 1.6 10mg  x2, 5mg  MF, 7.5mg  AOD   10/09/17 3.2 5mg  MF, 7.5mg  others   10/30/17 1.9 5mg  Mondays and 7.5mg  AOD   03/09/18  10mg  x3, 5mg  M, 7.5mg  AOD + enox 80mg  q12h   03/22/18 1.4 7.5mg    03/23/18  PLAN: 15mg    03/24/18  15mg    03/25/18 INR 12.5mg    03/26/18         ASSESSMENT:  INR subtherapeutic due to non-compliance. Will reload with warfarin, and restart enoxaparin.    PLAN:  1. Warfarin 15mg  x2 days, and 12.5mg  on 03/25/2018. Start enoxaparin 80mg  q12h.  2. Return in 2 days (on 03/25/2018).  3. Patient expressed understanding of this plan.  4. Walter Rice emailed this plan.    Forest Becker, PharmD

## 2018-03-24 ENCOUNTER — Other Ambulatory Visit (HOSPITAL_BASED_OUTPATIENT_CLINIC_OR_DEPARTMENT_OTHER): Payer: Self-pay | Admitting: Pharmacist

## 2018-03-24 DIAGNOSIS — Z7901 Long term (current) use of anticoagulants: Secondary | ICD-10-CM

## 2018-03-25 ENCOUNTER — Telehealth (HOSPITAL_BASED_OUTPATIENT_CLINIC_OR_DEPARTMENT_OTHER): Payer: Self-pay

## 2018-03-26 ENCOUNTER — Other Ambulatory Visit (HOSPITAL_BASED_OUTPATIENT_CLINIC_OR_DEPARTMENT_OTHER): Payer: Self-pay | Admitting: Pharmacist

## 2018-03-26 ENCOUNTER — Telehealth (HOSPITAL_BASED_OUTPATIENT_CLINIC_OR_DEPARTMENT_OTHER): Payer: Self-pay | Admitting: Pharmacist

## 2018-03-26 DIAGNOSIS — Z7901 Long term (current) use of anticoagulants: Secondary | ICD-10-CM

## 2018-03-26 LAB — PROTHROMBIN TIME: Prothrombin INR: 3.7

## 2018-03-26 NOTE — Telephone Encounter (Addendum)
ANTICOAGULATION TREATMENT PLAN    Indication: MVR; recurrent CVA  Goal INR: 2.5-3.5  Duration of Therapy: chronic    Hemorrhagic Risk Score: 1  Warfarin Tablet Size: 5mg     Relevant Historic Information: CVA 2008; 2005; 2006    Referring Provider: Nadene Rubins    02/08/16: Pacemaker generator change INR goal ~ 2.5 with no heparin bridge.  3/18: stopped smoking ~12/13/16  Aug 2018: started smoking again    SUBJECTIVE:   Nezar Langer was last evaluated by [facility] on 03/23/18. His INR was 1.4 (goal 2.5-3.5) due to non-compliance with warfarin and he was instructed to load his warfarin x 2 days and bridge with enoxaparin given his high CVA risk and repeat INR.     Today, I spoke with [name] by phone. He denies any signs/symptoms of bleeding, unusual bruising, or signs/symptoms of TIA/CVA. Denies melena and hematuria. No acute illnesses or overall changes in health.    He reports he was unable to pick up his enoxaparin until yesterday and took his first dose right after blood draw.     Diet: He reports his appetite is good. Diet stable and remains minimal in leafy greens.   EtOH: He consumed 1 beer in the past week.  Activity: no change   Med changes: Denies changes in Rx/OTCs/herbal medications since previous ACC visit.   Upcoming procedures: none       OBJECTIVE:     Current  dose:   At last visit, pt instructed to take 15mg  on 03/23/18 and 03/24/18 and 12.5mg  on 03/25/18. Loading doses were prescribed due to INR of 1.4.     Usual warfarin dose is 5mg  M, 7.5mg  on all other days of the week.     He was also instructed to bridge with enoxaparin but he was late picking up enoxaparin and did not take a dose until 03/25/18 following his lab draw. This was the only dose of enoxaparin.    OBJECTIVE:  DATE INR Warfarin Dose   06/17/17 3.1 5mg  MWF, 7.5mg  all other days    06/24/17 3.9 5mg  on 06/25/17, then 5mg  MWF, 7.5mg  on all other days   (Pt missed dose on 07/21/17 & took 10mg  (instead of 5mg ) on 07/22/17)   07/28/17 1.9 10mg  on  07/29/17, then increase to 5mg  Mondays/Fridays and 7.5mg  on all other days (this differs    08/07/17 3.3 ([place])  4.5 ([place]) HELD ADMIT   08/08/17 4.6 HELD DISCHARGE   08/09/17  HELD   08/10/17  12.5mg    08/11/17  5mg  MF, 7.5mg  AOD   08/20/17 2.7 5mg  MF, 7.5mg  AOD   10/01/17 1.6 10mg  x2, 5mg  MF, 7.5mg  AOD   10/09/17 3.2 5mg  MF, 7.5mg  others   10/30/17 1.9 5mg  Mondays and 7.5mg  AOD   03/09/18  10mg  x3, 5mg  M, 7.5mg  AOD + enox 80mg  q12h   03/22/18 1.4 7.5mg    03/23/18  15mg    03/24/18  15mg    03/25/18 3.7 12.5mg  + enox 80mg  x 1 dose    03/26/18  Plan: 5mg  x 2, then 5mg  MF, 7.5mg  AOD       ASSESSMENT:   Supratherapeutic INR from 03/25/18 following loading doses of warfarin. Recent low INR in the setting of non-compliance with warfarin. Pt has a long history of non-compliance with follow up and medications.     INR is likely higher today following 12.5mg  dose he took following his blood draw yesterday. Will therefore prescribe a lower dose over the next 2 days and then  resume his most recent maintenance dose.     PLAN:   1. D/C enoxaparin  2. Warfarin 5mg  tonight 03/26/18 and tomorrow, then warfarin 5mg  MF, 7.5mg  on all other days   3. Return in 6 days (on 04/01/2018).   4. [name] acknowledged understanding of this plan    Willaim Bane, PharmD, PharmD

## 2018-03-26 NOTE — Telephone Encounter (Deleted)
BLOOD DRAW LOCATION: [employer] [phone] (CE) [facility]  Fax [phone]     [facility] [address] [facility]     ANTICOAGULATION TREATMENT PLAN    Indication: MVR; recurrent CVA  Goal INR: 2.5-3.5  Duration of Therapy: chronic    Hemorrhagic Risk Score: 1  Warfarin Tablet Size: 5mg     Relevant Historic Information: CVA 2008; 2005; 2006    Referring Provider: Nadene Rubins    02/08/16: Pacemaker generator change INR goal ~ 2.5 with no heparin bridge.  3/18: stopped smoking ~12/13/16  Aug 2018: started smoking again    Anticoagulation Clinical Outcomes 06/06/2014 06/19/2014 12/13/2014   Type of Event Anticoagulation related ED visit Anticoagulation related ED visit Anticoagulation related ED visit   Description of Event hematuria asymptomatic hematuria 06/05/14  gum bleeding after brushing his teeth   Date of Event 06/05/2014 06/05/2014 12/12/2014   INR AT Time of Event/ED visit/Admission 3.5 3.5 3.5   Assessment/Plan/Resolution urology workup pending Continue warfarin at present dose and referral to urology no intervention - resolved spontaneously       SUBJECTIVE:   Walter Rice was last evaluated by [facility] on ***.   His INR was *** and he was instructed to ***       Patient denies any signs/symptoms of bleeding, unusual bruising, or signs/symptoms of TIA/CVA. Denies melena and hematuria.         Diet: appetiet good, minimial leafy greens   EtOH: 1 beer in last week   Activity: no change   Acute illness: none   Med changes: Denies changes in Rx/OTCs/herbal medications since previous [facility] visit.   Upcoming procedures: none       OBJECTIVE:     Current  dose:   Warfarin ***  No errors reported.    Enoxaparin 80mg  took yesterday after blood draw, total of 1 injection.        Relevant medication changes:   none    LABS:   Lab Results   Component Value Date    INR 3.7 03/25/2018    INR 1.4 03/22/2018    INR 1.9 10/29/2017    INR 3.2 10/09/2017    INR 1.6 10/01/2017    INR 4.5 01/22/2012        Lab Results    Component Value Date    HEMATOCRIT 39 02/08/2016    HEMATOCRIT 40 01/16/2015    HEMATOCRIT 42 12/12/2014    HEMATOCRIT 44 06/06/2014    HEMATOCRIT 41 06/05/2014    HEMATOCRIT 40 04/13/2014       ASSESSMENT:   ***    PLAN:   1. ***  2. No follow-ups on file.   3. Walter Rice acknowledged understanding of this plan    Willaim Bane, PharmD, PharmD

## 2018-04-01 ENCOUNTER — Telehealth (HOSPITAL_BASED_OUTPATIENT_CLINIC_OR_DEPARTMENT_OTHER): Payer: Self-pay

## 2018-04-06 ENCOUNTER — Encounter (HOSPITAL_BASED_OUTPATIENT_CLINIC_OR_DEPARTMENT_OTHER): Payer: Self-pay | Admitting: Unknown Physician Specialty

## 2018-04-06 NOTE — Progress Notes (Signed)
Warden Christopher Brodzinski is 3 days overdue for lab testing related to anticoagulant therapy. Left reminder message by telephone and mailed reminder letter today.

## 2018-04-07 LAB — PROTHROMBIN TIME: Prothrombin INR: 1.8

## 2018-04-09 ENCOUNTER — Telehealth (HOSPITAL_BASED_OUTPATIENT_CLINIC_OR_DEPARTMENT_OTHER): Payer: Self-pay

## 2018-04-09 ENCOUNTER — Other Ambulatory Visit (HOSPITAL_BASED_OUTPATIENT_CLINIC_OR_DEPARTMENT_OTHER): Payer: Self-pay | Admitting: Pharmacist

## 2018-04-09 DIAGNOSIS — Z7901 Long term (current) use of anticoagulants: Secondary | ICD-10-CM

## 2018-04-12 ENCOUNTER — Other Ambulatory Visit (HOSPITAL_BASED_OUTPATIENT_CLINIC_OR_DEPARTMENT_OTHER): Payer: Self-pay | Admitting: Pharmacist

## 2018-04-12 ENCOUNTER — Telehealth (HOSPITAL_BASED_OUTPATIENT_CLINIC_OR_DEPARTMENT_OTHER): Payer: Self-pay

## 2018-04-12 DIAGNOSIS — Z7901 Long term (current) use of anticoagulants: Secondary | ICD-10-CM

## 2018-04-13 ENCOUNTER — Telehealth (HOSPITAL_BASED_OUTPATIENT_CLINIC_OR_DEPARTMENT_OTHER): Payer: Self-pay | Admitting: Pharmacist

## 2018-04-13 DIAGNOSIS — Z7901 Long term (current) use of anticoagulants: Secondary | ICD-10-CM

## 2018-04-13 NOTE — Telephone Encounter (Signed)
ANTICOAGULATION TREATMENT PLAN    Indication: MVR; recurrent CVA  Goal INR: 2.5-3.5  Duration of Therapy: chronic    Hemorrhagic Risk Score: 1  Warfarin Tablet Size: 5mg     Relevant Historic Information: CVA 2008; 2005; 2006    Referring Provider: Nadene Rubins    02/08/16: Pacemaker generator change INR goal ~ 2.5 with no heparin bridge.  3/18: stopped smoking ~12/13/16  Aug 2018: started smoking again      SUBJECTIVE:   Walter Rice was last evaluated by [facility] on 03/26/18. His INR was 3.7 and he was instructed to discontinue enoxaparin injections and take warfarin 5mg  on 03/26/18 and 03/27/18. Then take warfarin 5mg  MF and 7.5mg  all other days (47.5mg /wk) with followup in 6 days (04/01/18).    Patient was 6 days overdue for labs. Today, I spoke to Walter Rice by phone, and he reports no s/sx of bleeding or bruising. No s/sx of thrombosis.     Diet: no changes, he has greens every once in awhile  Alcohol: no changes, he only drinks on special occasions. He had some drinks on 04/08/18  Activities: no changes  Medications: no changes  Acute Illness: none  Procedures: none      OBJECTIVE:     Current  dose:   He was instructed to discontinue enoxaparin injections and take warfarin 5mg  on 03/26/18 and 03/27/18. Then take warfarin 5mg  MF and 7.5mg  all other days (47.5mg /wk). No errors reported. Patient did NOT take 15mg  of warfarin on 04/09/18 as instructed by [facility] on voicemail message.    Anticoagulation Flowsheet  DATE INR Warfarin Dose   06/17/17 3.1 5mg  MWF, 7.5mg  all other days    06/24/17 3.9 5mg  on 06/25/17, then 5mg  MWF, 7.5mg  on all other days   (Pt missed dose on 07/21/17 & took 10mg  (instead of 5mg ) on 07/22/17)   07/28/17 1.9 10mg  on 07/29/17, then increase to 5mg  Mondays/Fridays and 7.5mg  on all other days (this differs    08/07/17 3.3 ([place])  4.5 ([place]) HELD ADMIT   08/08/17 4.6 HELD DISCHARGE   08/09/17  HELD   08/10/17  12.5mg    08/11/17  5mg  MF, 7.5mg  AOD   08/20/17 2.7 5mg  MF, 7.5mg  AOD    10/01/17 1.6 10mg  x2, 5mg  MF, 7.5mg  AOD   10/09/17 3.2 5mg  MF, 7.5mg  others   10/30/17 1.9 5mg  Mondays and 7.5mg  AOD   03/09/18  10mg  x3, 5mg  M, 7.5mg  AOD + enox 80mg  q12h   03/22/18 1.4 7.5mg    03/23/18  15mg    03/24/18  15mg    03/25/18 3.7 12.5mg  + enox 80mg  x 1 dose    03/26/18  5mg  x 2, then 5mg  MF, 7.5mg  AOD   04/07/18 1.8 Plan: 15mg  (instead of 7.5mg ) on 04/13/18 then resume 5mg  MF and 7.5mg  AOD   04/19/18 INR        Relevant medication changes:   none       ASSESSMENT:   INR is subtherapeutic, fortunately no s/sx of thrombosis or bleeding. Patient is aware of when to seek immediate medical attention. Denies any changes in diet/medication/lifestyle or warfarin dosing errors.    Due to risk of clot, recommended enoxaparin injections but patient refused. Will give a high (~15.8%) warfarin booster dose today to help INR increase to goal. Continue to monitor INR closely for trend.      PLAN:   1. Take warfarin 15mg  (instead of 7.5mg ) today 04/13/18 then resume warfarin 5mg  MF and 7.5mg  all other days (47.5mg /wk)  2. Return in 6 days (on 04/19/2018). Advised earlier follow up, patient has a tight schedule and will try to check INR sooner but cannot guarantee. [facility] to follow up whenever INR results, no later than this Monday.  3. Walter Rice acknowledged understanding of this plan    Ossie Yebra Ellender Hose, PharmD

## 2018-04-17 LAB — PROTHROMBIN TIME: Prothrombin INR: 1.9

## 2018-04-19 ENCOUNTER — Telehealth (HOSPITAL_BASED_OUTPATIENT_CLINIC_OR_DEPARTMENT_OTHER): Payer: Self-pay | Admitting: Pharmacist

## 2018-04-19 ENCOUNTER — Other Ambulatory Visit (HOSPITAL_BASED_OUTPATIENT_CLINIC_OR_DEPARTMENT_OTHER): Payer: Self-pay | Admitting: Pharmacist

## 2018-04-19 DIAGNOSIS — Z7901 Long term (current) use of anticoagulants: Secondary | ICD-10-CM

## 2018-04-19 NOTE — Telephone Encounter (Signed)
ANTICOAGULATION TREATMENT PLAN    Indication: MVR; recurrent CVA  Goal INR: 2.5-3.5  Duration of Therapy: chronic    Hemorrhagic Risk Score: 1  Warfarin Tablet Size: 5mg     Relevant Historic Information: CVA 2008; 2005; 2006    Referring Provider: Nadene Rubins    02/08/16: Pacemaker generator change INR goal ~ 2.5 with no heparin bridge.  3/18: stopped smoking ~12/13/16  Aug 2018: started smoking again     SUBJECTIVE:   Walter Rice was last evaluated by [facility] on 04/13/18. His INR was 1.8 04/07/18  and he was instructed to take 15mg  04/13/18 and was told to resume warfarin 5mg  MF, 7.5mg  other.  At the time, pt declined LMWH injections for bridge to therapeutic INR.        Today, pt reports by phone (along with his significant other):      S/sx of bleeding/bruising: No, Denies problems in this area  S/sx of CVA/TIA (HA, visual changes):  No, Denies problems in this area   Diet/Appetite:denies changes.  Has not increased his oral intake of vitamin K foods. No use of green tea or protein shakes. Has a Green NOS energy drink (contains B vitamins, niacin, panthothenic acid)  EtOH: Had 1/2 beer last night.  Occasionally has a serving.   Tobacco: Switched to a new brand of cigarettes.   Activity:  Similar the last couple of weeks.   Acute illness: denies illness      Present dose: Warfarin 5mg  MF, 7.5mg  other (took 15mg  04/13/18)    DATE INR Warfarin Dose   06/17/17 3.1 5mg  MWF, 7.5mg  all other days    06/24/17 3.9 5mg  on 06/25/17, then 5mg  MWF, 7.5mg  on all other days   (Pt missed dose on 07/21/17 & took 10mg  (instead of 5mg ) on 07/22/17)   07/28/17 1.9 10mg  on 07/29/17, then increase to 5mg  Mondays/Fridays and 7.5mg  on all other days (this differs    08/07/17 3.3 ([place])  4.5 ([place]) HELD ADMIT   08/08/17 4.6 HELD DISCHARGE   08/09/17  HELD   08/10/17  12.5mg    08/11/17  5mg  MF, 7.5mg  AOD   08/20/17 2.7 5mg  MF, 7.5mg  AOD   10/01/17 1.6 10mg  x2, 5mg  MF, 7.5mg  AOD   10/09/17 3.2 5mg  MF, 7.5mg  others   10/30/17 1.9  5mg  Mondays and 7.5mg  AOD   03/09/18  10mg  x3, 5mg  M, 7.5mg  AOD + enox 80mg  q12h   03/22/18 1.4 7.5mg    03/23/18  15mg    03/24/18  15mg    03/25/18 3.7 12.5mg  + enox 80mg  x 1 dose    03/26/18  5mg  x 2, then 5mg  MF, 7.5mg  AOD   04/07/18 1.8 15mg  (instead of 7.5mg ) on 04/13/18 then resume 5mg  MF and 7.5mg  AOD   04/19/18 1.9 Plan: 7.5mg  daily         OBJECTIVE:     Relevant medication changes: Denies    ASSESSMENT:   INR below goal on present warfarin maintenance dose and remains low despite compliance and routine diet and activity.  Pt would benefit from an increase in maintenance dose to aim for mid-therapeutic INR.      PLAN:   1. Increase to warfarin 7.5mg  daily (a 15% increase to 52.5mg /wk)  2. Return in 1 week (on 04/26/2018). INR @ [facility] or [facility].  3. Jabron (and significant other) verbally expressed understanding of the above plan.        Dia Sitter, PharmD

## 2018-04-26 ENCOUNTER — Telehealth (HOSPITAL_BASED_OUTPATIENT_CLINIC_OR_DEPARTMENT_OTHER): Payer: Self-pay

## 2018-04-28 ENCOUNTER — Telehealth (HOSPITAL_BASED_OUTPATIENT_CLINIC_OR_DEPARTMENT_OTHER): Payer: Self-pay

## 2018-04-28 LAB — PROTHROMBIN TIME: Prothrombin INR: 3.5

## 2018-04-29 ENCOUNTER — Telehealth (HOSPITAL_BASED_OUTPATIENT_CLINIC_OR_DEPARTMENT_OTHER): Payer: Self-pay

## 2018-04-29 ENCOUNTER — Other Ambulatory Visit (HOSPITAL_BASED_OUTPATIENT_CLINIC_OR_DEPARTMENT_OTHER): Payer: Self-pay | Admitting: Pharmacist

## 2018-04-29 DIAGNOSIS — Z7901 Long term (current) use of anticoagulants: Secondary | ICD-10-CM

## 2018-04-30 ENCOUNTER — Telehealth (HOSPITAL_BASED_OUTPATIENT_CLINIC_OR_DEPARTMENT_OTHER): Payer: Self-pay

## 2018-05-03 ENCOUNTER — Telehealth (HOSPITAL_BASED_OUTPATIENT_CLINIC_OR_DEPARTMENT_OTHER): Payer: Self-pay

## 2018-05-04 ENCOUNTER — Telehealth (HOSPITAL_BASED_OUTPATIENT_CLINIC_OR_DEPARTMENT_OTHER): Payer: Self-pay

## 2018-05-04 ENCOUNTER — Encounter (HOSPITAL_BASED_OUTPATIENT_CLINIC_OR_DEPARTMENT_OTHER): Payer: Self-pay | Admitting: Unknown Physician Specialty

## 2018-05-04 NOTE — Progress Notes (Signed)
ACC has been unable to reach this patient by phone.  A letter is being sent.    Attempts:   7/30   ac LM for pt 1100.  VM full at 1512  7/29   HS LM 9:19 am   7/26  ac LM for pt with INR result 1530   7/25   1630 lm x 1 ywm    Pt is going to failed list

## 2018-05-21 ENCOUNTER — Other Ambulatory Visit (HOSPITAL_BASED_OUTPATIENT_CLINIC_OR_DEPARTMENT_OTHER): Payer: Self-pay | Admitting: Pharmacist

## 2018-05-21 DIAGNOSIS — Z7901 Long term (current) use of anticoagulants: Secondary | ICD-10-CM

## 2018-05-21 MED ORDER — WARFARIN SODIUM 5 MG OR TABS
ORAL_TABLET | ORAL | 1 refills | Status: DC
Start: 2018-05-21 — End: 2018-07-20

## 2018-05-28 LAB — PROTHROMBIN TIME: Prothrombin INR: 3.6

## 2018-05-31 ENCOUNTER — Telehealth (HOSPITAL_BASED_OUTPATIENT_CLINIC_OR_DEPARTMENT_OTHER): Payer: Self-pay

## 2018-05-31 ENCOUNTER — Other Ambulatory Visit (HOSPITAL_BASED_OUTPATIENT_CLINIC_OR_DEPARTMENT_OTHER): Payer: Self-pay | Admitting: Pharmacist

## 2018-05-31 DIAGNOSIS — Z7901 Long term (current) use of anticoagulants: Secondary | ICD-10-CM

## 2018-06-01 ENCOUNTER — Telehealth (HOSPITAL_BASED_OUTPATIENT_CLINIC_OR_DEPARTMENT_OTHER): Payer: Self-pay | Admitting: Pharmacist

## 2018-06-01 DIAGNOSIS — Z7901 Long term (current) use of anticoagulants: Secondary | ICD-10-CM

## 2018-06-01 NOTE — Telephone Encounter (Signed)
ANTICOAGULATION TREATMENT PLAN    Indication: MVR; recurrent CVA  Goal INR: 2.5-3.5  Duration of Therapy: chronic    Hemorrhagic Risk Score: 1  Warfarin Tablet Size: 5mg     Relevant Historic Information: CVA 2008; 2005; 2006    Referring Provider: Nadene Rubins    02/08/16: Pacemaker generator change INR goal ~ 2.5 with no heparin bridge.  3/18: stopped smoking ~12/13/16  Aug 2018: started smoking again     SUBJECTIVE:   Walter Rice was last evaluated by [facility] on 04/19/18. His INR was 1.9 and he was instructed to increase to 7.5mg  daily.  He did not return calls to [facility] to discuss the last INR from 04/28/18 which was in goal range.       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  Doesn't always remember to call us back during the work day. Suggested we check in with his wife to discuss INR results and gives his permission to do so.      S/sx of bleeding/bruising: No, Denies problems in this area  S/sx of CVA/TIA (HA, visual changes):  No, Denies problems in this area   Diet/Appetite:He has 3 meals a day.  Has a serving of green vegetables every couple of days.   EtOH: Had some last night in celebration of his birthday.  Tobacco: denies changes  Activity:  Active at work.   Acute illness: Has ongoing back pain.       Present dose: Warfarin 7.5mg  daily    OBJECTIVE:     Relevant medication changes: None.  Med list updated/reviewed with patient today.      LABS:   Lab Results   Component Value Date    INR 3.6 05/28/2018    INR 3.5 04/28/2018    INR 1.9 04/16/2018    INR 1.8 04/07/2018    INR 3.7 03/25/2018    INR 1.4 03/22/2018       ASSESSMENT:   INR just above goal but stable appearing in comparison to last month. Appropriate to continue present warfarin maintenance dose for now in pt without any bleeding concerns or events.  Suggested he consult with a family physician to discuss low back pain and seek referral to physical therapy.  He prefers to try home exercises, first.     Suggested pt  consider weekend lab draw at [facility] to avoid going to lab during the work day.      PLAN:   1. Continue warfarin 7.5mg  daily (52.5mg /wk)  2. Return in 1 month (on 06/29/2018). INR @ [facility] or [place] [place].  3. Berley verbally expressed understanding of the above plan.        Dia Sitter, PharmD

## 2018-06-29 ENCOUNTER — Telehealth (HOSPITAL_BASED_OUTPATIENT_CLINIC_OR_DEPARTMENT_OTHER): Payer: Self-pay

## 2018-07-02 ENCOUNTER — Encounter (HOSPITAL_BASED_OUTPATIENT_CLINIC_OR_DEPARTMENT_OTHER): Payer: Self-pay | Admitting: Unknown Physician Specialty

## 2018-07-02 NOTE — Progress Notes (Signed)
Walter Rice is 3 days overdue for lab testing related to anticoagulant therapy. Left reminder message by telephone and mailed reminder letter today.

## 2018-07-06 ENCOUNTER — Encounter (HOSPITAL_BASED_OUTPATIENT_CLINIC_OR_DEPARTMENT_OTHER): Payer: Self-pay | Admitting: Unknown Physician Specialty

## 2018-07-06 NOTE — Progress Notes (Signed)
Walter Rice is 1 week overdue for lab testing related to anticoagulant therapy.  Left reminder message by telephone.

## 2018-07-07 ENCOUNTER — Other Ambulatory Visit (HOSPITAL_BASED_OUTPATIENT_CLINIC_OR_DEPARTMENT_OTHER): Payer: Self-pay | Admitting: Interventional Cardiology

## 2018-07-07 DIAGNOSIS — Q212 Atrioventricular septal defect, unspecified as to partial or complete: Secondary | ICD-10-CM

## 2018-07-09 ENCOUNTER — Telehealth (HOSPITAL_BASED_OUTPATIENT_CLINIC_OR_DEPARTMENT_OTHER): Payer: Self-pay

## 2018-07-09 ENCOUNTER — Other Ambulatory Visit (HOSPITAL_BASED_OUTPATIENT_CLINIC_OR_DEPARTMENT_OTHER): Payer: Self-pay | Admitting: Pharmacist

## 2018-07-09 DIAGNOSIS — Z7901 Long term (current) use of anticoagulants: Secondary | ICD-10-CM

## 2018-07-09 LAB — PROTHROMBIN TIME: Prothrombin INR: 4.5

## 2018-07-12 ENCOUNTER — Telehealth (HOSPITAL_BASED_OUTPATIENT_CLINIC_OR_DEPARTMENT_OTHER): Payer: Self-pay

## 2018-07-13 ENCOUNTER — Telehealth (HOSPITAL_BASED_OUTPATIENT_CLINIC_OR_DEPARTMENT_OTHER): Payer: Self-pay

## 2018-07-14 ENCOUNTER — Telehealth (HOSPITAL_BASED_OUTPATIENT_CLINIC_OR_DEPARTMENT_OTHER): Payer: Self-pay | Admitting: Pharmacist

## 2018-07-14 DIAGNOSIS — Z7901 Long term (current) use of anticoagulants: Secondary | ICD-10-CM
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
## 2018-07-20 ENCOUNTER — Other Ambulatory Visit (HOSPITAL_BASED_OUTPATIENT_CLINIC_OR_DEPARTMENT_OTHER): Payer: Self-pay | Admitting: Pharmacist

## 2018-07-20 DIAGNOSIS — Z7901 Long term (current) use of anticoagulants: Secondary | ICD-10-CM

## 2018-07-20 MED ORDER — WARFARIN SODIUM 5 MG OR TABS
ORAL_TABLET | ORAL | 1 refills | Status: DC
Start: 2018-07-20 — End: 2018-11-11

## 2018-07-21 ENCOUNTER — Telehealth (HOSPITAL_BASED_OUTPATIENT_CLINIC_OR_DEPARTMENT_OTHER): Payer: Self-pay

## 2018-07-26 ENCOUNTER — Encounter (HOSPITAL_BASED_OUTPATIENT_CLINIC_OR_DEPARTMENT_OTHER): Payer: Self-pay | Admitting: Unknown Physician Specialty

## 2018-07-26 NOTE — Progress Notes (Signed)
Walter Rice is 3 days overdue for lab testing related to anticoagulant therapy. Left reminder message by telephone and mailed reminder letter today.

## 2018-07-29 ENCOUNTER — Encounter (HOSPITAL_BASED_OUTPATIENT_CLINIC_OR_DEPARTMENT_OTHER): Payer: Self-pay | Admitting: Unknown Physician Specialty

## 2018-07-29 NOTE — Progress Notes (Signed)
Walter Rice is 1 week overdue for lab testing related to anticoagulant therapy.  Left reminder message by telephone.

## 2018-08-02 ENCOUNTER — Inpatient Hospital Stay: Payer: Self-pay

## 2018-08-02 LAB — CREATININE, EXTERNAL: Creatinine, External: 1.1

## 2018-08-02 LAB — CBC (HEMOGRAM), EXTERNAL
Hematocrit, External: 42
Hemoglobin, External: 13.5
MCH, External: 31.5
MCHC, External: 32.5
MCV, External: 97
Platelet Count, External: 215
RBC, External: 4.28
RDW-CV, External: 12.6
WBC, External: 7.4

## 2018-08-02 LAB — PROTHROMBIN TIME: Prothrombin INR: 1.2

## 2018-08-02 NOTE — Telephone Encounter (Signed)
Pt called in today and spoke with Halima. She was unsure on scheduling. Does he need next avail WITH or WITHOUT testing?

## 2018-08-03 ENCOUNTER — Telehealth (HOSPITAL_BASED_OUTPATIENT_CLINIC_OR_DEPARTMENT_OTHER): Payer: Self-pay | Admitting: Pharmacist

## 2018-08-03 ENCOUNTER — Encounter (HOSPITAL_BASED_OUTPATIENT_CLINIC_OR_DEPARTMENT_OTHER): Payer: Self-pay | Admitting: Pharmacist

## 2018-08-03 ENCOUNTER — Other Ambulatory Visit (HOSPITAL_BASED_OUTPATIENT_CLINIC_OR_DEPARTMENT_OTHER): Payer: Self-pay | Admitting: Pharmacist

## 2018-08-03 DIAGNOSIS — Z7901 Long term (current) use of anticoagulants: Secondary | ICD-10-CM

## 2018-08-03 NOTE — Progress Notes (Addendum)
ED VISIT  08/02/18  - ANTICOAGULATION SUMMARY      ANTICOAGULATION TREATMENT PLAN    Indication: MVR; recurrent CVA  Goal INR: 2.5-3.5  Duration of Therapy: chronic    Hemorrhagic Risk Score: 1  Warfarin Tablet Size: 5mg     Relevant Historic Information: CVA 2008; 2005; 2006    Referring Provider: Nadene Rubins    02/08/16: Pacemaker generator change INR goal ~ 2.5 with no heparin bridge.  3/18: stopped smoking ~12/13/16  Aug 2018: started smoking again    INTERVAL HISTORY    Walter Rice was last evaluated by [facility] on 07/14/18. His INR was 4.5 and he was instructed to hold warfarin x 1 then decrease dose to 5mg  MF, 7.5mg  all other days. He has not returned for an INR as scheduled on 07/21/18.    On 08/02/18, he presented to [facility] ED for loss of vision in his R eye that started in the morning. Sx improved, but occurred again 30 min prior to ED visit. He reports being unable to see anything out of his right eye. He reports running out of warfarin 6 days prior. INR was 1.2. CT head was negative for CVA fortunately. Labs unremarkable. No ischemic changes on EKG. He continued to state he was unable to see out of his R eye, but was sitting and reading on his cell phone. He was advised to start an enoxaparin bridge to warfarin and was discharged home. Of note, he told the physician he has seen an ophthalmologist during prior episodes with no ocular abnormalities found.    LABS  Scr of 1.1  Weight 79.3kg in Epic, 73kg documented in ED visit note  Height - 182.9cm  Hct 42%  Plt 215    Est CrCl >16mL/min        PLAN:  1) INR of 1.2 in the ED - ACC will follow-up on this.  2) Discharged on enoxaparin 80mg  SC q12h and home dose of warfarin (5mg  MF, 7.5mg  all other days). Unclear if loading doses of warfarin were advised. Confirm weight and change enox dose if needed.      Dianna Limbo, PharmD

## 2018-08-03 NOTE — Telephone Encounter (Signed)
ANTICOAGULATION TREATMENT PLAN    Indication: MVR; recurrent CVA  Goal INR: 2.5-3.5  Duration of Therapy: chronic    Hemorrhagic Risk Score: 1  Warfarin Tablet Size: 5mg     Relevant Historic Information: CVA 2008; 2005; 2006    Referring Provider: Nadene Rubins    02/08/16: Pacemaker generator change INR goal ~ 2.5 with no heparin bridge.  3/18: stopped smoking ~12/13/16  Aug 2018: started smoking again      INTERVAL HISTORY    Walter Rice was last evaluated by [facility] on 07/14/18. His INR was 4.5 and he was instructed to hold warfarin x 1 then decrease dose to 5mg  MF, 7.5mg  all other days. He has not returned for an INR as scheduled on 07/21/18.    On 08/02/18, he presented to [facility] ED for loss of vision in his R eye that started in the morning. Sx improved, but occurred again 30 min prior to ED visit. He reports being unable to see anything out of his right eye. He reports running out of warfarin 6 days prior. INR was 1.2. CT head was negative for CVA fortunately. Labs unremarkable. No ischemic changes on EKG. He continued to state he was unable to see out of his R eye, but was sitting and reading on his cell phone. He was advised to start an enoxaparin bridge to warfarin and was discharged home. Of note, he told the physician he has seen an ophthalmologist during prior episodes with no ocular abnormalities found.     SUBJECTIVE:     Walter Rice (wife) was available by phone to discuss his lab results:    S/sx of bleeding/bruising: No, Denies problems in this area  S/sx of CVA/TIA (HA, visual changes):  Symptoms of vision loss of the right eye evolving yesterday prompting an ED visit.    Diet/Appetite:He is eating a keto diet (fewer carbs) and still has some green vegetables in the diet.   EtOH: denies use  Tobacco: Still smoking cigarettes.   Activity:  Has a labor intensive job working Aeronautical engineer.   Acute illness: Blurred vision evolving to vision loss in the right eye yesterday.  CT scan negative for  CVA.       Present dose: Walter Rice gave Walter Rice a stern talking to for running out of warfarin for 5 days prior to yesterday's ED visit.  She promptly put him on enoxaparin 80mg  SQ Q 12 hrs starting yesterday (08/02/18) prior to the ED visit.  ED providers instructed her to follow up with [facility] today.     Pt had been on warfarin 5mg  MF, 7.5mg  other per Walter Rice.     OBJECTIVE:     Relevant medication changes: As above.  Med list updated/reviewed with patient today.      LABS:   Lab Results   Component Value Date    INR 1.2 08/02/2018    INR 4.5 07/09/2018    INR 3.6 05/28/2018    INR 3.5 04/28/2018    INR 1.9 04/16/2018    INR 1.8 04/07/2018    INR 4.5 01/22/2012     Lab Results   Component Value Date    HEMATOCRIT 42 08/02/2018    HEMATOCRIT 39 02/08/2016    HEMATOCRIT 40 01/16/2015     Lab Results   Component Value Date    PLATELET 215 08/02/2018    PLATELET 202 02/08/2016    PLATELET 202 01/16/2015     Lab Results   Component Value Date    CREATININE 1.06  02/08/2016    CREATININE 0.96 01/16/2015    CREATININE 0.98 06/06/2014        Estimated CrCl 99 ml/min    Wt Readings from Last 3 Encounters:   08/03/18 161 lb (73 kg)   07/21/16 174 lb 12.8 oz (79.3 kg)   01/24/16 173 lb (78.5 kg)           ASSESSMENT:   Low INR explained by 5 day lapse in taking his dose of warfarin.  Warfarin loading necessary for the next 3 days to return INR to range promptly.  CT scan negative for CVA.  He was able to refill his supply of warfarin.  Had been having trouble with the high cost copay at [facility] pharmacy.  Was able to fill it elsewhere for less.  Juanda Bond to connect with a Child psychotherapist in cardiology clinic to discuss medical benefits and eligibility with his disability status.  Financial burden present with the current cost of health care through her employer.    LMWH bridge with enoxaparin necessary until INR therapeutic.  Dose will need to be decreased to 70mg  every 12 hours (1 mg/kg/dose) based on new weight reported by  Walter Rice, today.     PLAN:   1. Warfarin 12.5mg  08/03/18, 12.5mg  08/04/18 and 12.5mg  08/05/18.  Continue Lovenox at new dose of 70mg  SQ Q 12 hrs.   2. Return in 3 days (on 08/06/2018). CBC Danae [place] @ [facility].  3. [name] verbally expressed understanding of the above plan.        Dia Sitter, PharmD

## 2018-08-03 NOTE — Telephone Encounter (Signed)
Made attempt, lvm. Will follow up.

## 2018-08-06 ENCOUNTER — Telehealth (HOSPITAL_BASED_OUTPATIENT_CLINIC_OR_DEPARTMENT_OTHER): Payer: Self-pay | Admitting: Pharmacist

## 2018-08-06 ENCOUNTER — Telehealth (HOSPITAL_BASED_OUTPATIENT_CLINIC_OR_DEPARTMENT_OTHER): Payer: Self-pay

## 2018-08-06 ENCOUNTER — Encounter (HOSPITAL_BASED_OUTPATIENT_CLINIC_OR_DEPARTMENT_OTHER): Payer: Self-pay

## 2018-08-06 DIAGNOSIS — Z7901 Long term (current) use of anticoagulants: Secondary | ICD-10-CM

## 2018-08-06 NOTE — Telephone Encounter (Signed)
INTERVAL HISTORY:    Walter Rice was last evaluated by [facility] on 08/03/18 .  His  INR was 1.2 (explained by running out of warfarin for several days) and he was instructed to do the following after communicating with Walter Rice:    1. Take warfarin 12.5mg  08/03/18, 12.5mg  08/04/18 and 12.5mg  08/05/18.  Continue Lovenox at new dose of 70mg  SQ Q 12 hrs.   2. Return in 3 days (on 08/06/2018). CBC Walter Rice @ [facility].  3. Walter Rice verbally expressed understanding of the above plan      A message was left for Walter Rice to call ACC at 1745.     A second message was left with Walter Rice at 2030 with the following instructions:    PLAN:    1.  Warfarin 7.5mg  daily for now  2.  Suspect it would be time to discontinue enoxaparin but do not have any current lab results to reflect that decision-making.  3.  Requested Walter Rice contact [facility] so the best plan can be implemented for him.

## 2018-08-11 ENCOUNTER — Encounter (HOSPITAL_BASED_OUTPATIENT_CLINIC_OR_DEPARTMENT_OTHER): Payer: Self-pay | Admitting: Unknown Physician Specialty

## 2018-08-11 NOTE — Progress Notes (Signed)
Walter Rice is 3 days overdue for lab testing related to anticoagulant therapy.  Left reminder message by telephone and mailed reminder letter today.     11/6 LM  11/5 LM with [name]  11/4 STRAIGHT TO VM  1945 lm [name] cmf  1614 LM on [name]'s number  1211 left reminder message  SAME DAY  INR/CBC @ [facility]    Many attempts

## 2018-08-13 ENCOUNTER — Encounter (HOSPITAL_BASED_OUTPATIENT_CLINIC_OR_DEPARTMENT_OTHER): Payer: Self-pay | Admitting: Unknown Physician Specialty

## 2018-08-13 NOTE — Progress Notes (Signed)
Rajon Christopher Sagan is 1 week overdue for lab testing related to anticoagulant therapy.  Left reminder message by telephone.

## 2018-08-20 ENCOUNTER — Encounter (HOSPITAL_BASED_OUTPATIENT_CLINIC_OR_DEPARTMENT_OTHER): Payer: Self-pay | Admitting: Unknown Physician Specialty

## 2018-08-20 NOTE — Progress Notes (Signed)
Walter Rice is 2 weeks overdue for lab testing related to anticoagulant therapy.  Left reminder message by telephone.     Left message on Walter Rice's number

## 2018-08-27 ENCOUNTER — Encounter (HOSPITAL_BASED_OUTPATIENT_CLINIC_OR_DEPARTMENT_OTHER): Payer: Self-pay | Admitting: Unknown Physician Specialty

## 2018-08-27 NOTE — Progress Notes (Signed)
Walter Rice is 3 weeks overdue for lab testing related to anticoagulant therapy.  Left reminder message by telephone.

## 2018-09-01 ENCOUNTER — Encounter (HOSPITAL_BASED_OUTPATIENT_CLINIC_OR_DEPARTMENT_OTHER): Payer: Self-pay | Admitting: Unknown Physician Specialty

## 2018-09-01 NOTE — Progress Notes (Signed)
Navarro Christopher Appelt is 30 days overdue for lab testing related to anticoagulant therapy.  Reminder letter mailed to patient today.

## 2018-09-28 ENCOUNTER — Telehealth (HOSPITAL_BASED_OUTPATIENT_CLINIC_OR_DEPARTMENT_OTHER): Payer: Self-pay | Admitting: Nurse Practitioner

## 2018-09-28 NOTE — Telephone Encounter (Signed)
I have left couple of voicemail messages to patient checking to see if he has established care else were for his pacemaker also left our information in the event he needs to make an apt.

## 2018-10-05 ENCOUNTER — Encounter (HOSPITAL_BASED_OUTPATIENT_CLINIC_OR_DEPARTMENT_OTHER): Payer: Self-pay | Admitting: Unknown Physician Specialty

## 2018-10-05 ENCOUNTER — Encounter (HOSPITAL_BASED_OUTPATIENT_CLINIC_OR_DEPARTMENT_OTHER): Payer: Self-pay | Admitting: Nurse Practitioner

## 2018-10-05 NOTE — Progress Notes (Signed)
Walter Rice is 60 days overdue for lab testing related to anticoagulant therapy.  Reminder letter mailed to patient today.

## 2018-11-11 ENCOUNTER — Encounter (HOSPITAL_BASED_OUTPATIENT_CLINIC_OR_DEPARTMENT_OTHER): Payer: Self-pay | Admitting: Pharmacist

## 2018-11-11 NOTE — Addendum Note (Signed)
Addended by: Tempestt Silba, Samara Deist C on: 11/11/2018 11:52 AM     Modules accepted: Orders

## 2018-11-11 NOTE — Progress Notes (Signed)
Walter Rice is more than 90 days overdue for INR testing and followup with [facility].  He has not replied to multiple phone calls and letters. He has been discharged from service, with termination letter mailed today.      Dianna Limbo, PharmD

## 2018-11-14 ENCOUNTER — Inpatient Hospital Stay: Payer: Self-pay

## 2018-11-15 NOTE — Discharge Summary (Addendum)
 [facility]  Inpatient Hospitalist Discharge Note    Name: Walter Rice MRN: [id number]   Admit Date: 11/14/2018 Date of Discharge: 11/16/2018   Code Status Full Code Date of Service: 11/16/2018   PCP: BARNIE LYNN GREENBERG, MD, MD       Discharging Provider:  Daniel Jed Brasil, MD    Consultants:  None     Discharge Diagnosis:     Principal Problem:    Chest pain  Active Problems:    History of prosthetic mitral valve    Chronic anticoagulation    Cigarette nicotine dependence    H/o congenital heart disease      New hospital findings, inpatient pending tests and other issues requiring specific attention at follow-up:   None      Hospital Operative Procedures:  * No surgery found *    Brief Reason for Admission:     Chest pain     Hospital Course:  This is a 33yo M with 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 In the ED, initial troponin was negative. EKG showed no acute ST-T changes. He was brought in for further evaluation of chest pain. Serial troponins were negative. 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 A TEE was subsequently done, which revealed NO LA thrombus, normal functioning mechanical MV, mild AI, PPM leads in right heart chambers, no evidence for ASD or PFO. During the hospital stay, chest pain improved. On 11/16/18, the patient was thought to be medically stable and was discharged home in stable condition. He was instructed to follow up with his cardiologist Dr. Florian in 1-2 weeks.      I have seen and examined Walter Rice on 11/16/2018.    Vital Signs:  Vitals   Temp: 36.6 C (97.9 F) - BP: 97/58 - Heart Rate: 60 - Resp: 18 - SpO2: 96 %   - Temp  Min: 36.3 C (97.3 F)  Max: 37.1 C (98.8 F)   I / O   I/O  last 3 completed shifts:  In: 368 [P.O.:358; I.V.:10]  Out: 1550 [Urine:1550]   Weight / BMI   Recent weight: 72.4 kg (159 lb 9.8 oz) (11/16/18 0205) - Body mass index is 20.66 kg/m.     Admit weight: 74.8 kg (165 lb)       Physical Exam:  General: Alert and oriented x3 , no distress, verbal, non-toxic looking   HEENT: PEERL, normal conjunctiva, no icterus, moist mucous membranes  Neck: No lymphadenopathy, no carotid bruits  CVS: Normal S1&S2, regular rate and rhythm,  murmur noted, mid sternal op scar well healed, pacemaker in place   Resp: Clear to auscultation bilaterally, no wheezes, no crackles, unlabored breathing   Abd: BS+, soft, non-tender, non-distended  Ext: No LE edema  Neuro: No focal motor deficits   Skin: Intact, no rash, warm    Psych: No depressive mood, no anxiety       Discharge Medications:     Current Discharge Medication List      CONTINUE these medications which have NOT CHANGED    Details   methocarbamol (ROBAXIN) 500 MG tablet Take 500 mg by mouth 2 (two) times a day as needed.      !!  warfarin (COUMADIN ) 5 MG tablet Take 5 mg by mouth once daily.      !! warfarin (COUMADIN ) 7.5 MG tablet Take 7.5 mg by mouth once daily.       !! - Potential duplicate medications found. Please discuss with provider.      STOP taking these medications       enoxaparin  (LOVENOX ) 80 mg/0.8 mL Syringe Comments:   Reason for Stopping:               Contact information for follow-up            DARICE MARLA CHESTER, MD   Specialty:  Cardiology    [facility]  [address] [place] [address]   Phone:  [phone]       Next Steps:  Follow up in 1 week(s)             Disposition: Home       Patient Discharge Instructions:     Follow-up plan: As abov       This discharge summary has been routed to [facility] LYNN GREENBERG, MD, MD via inbasket message or fax.    I spent 40 minutes on discharge activities for Walter Rice on 11/16/2018.    Thank you for allowing me to participate in the care of Walter Rice.    Daniel Jed Brasil, MD  November 16, 2018

## 2018-11-15 NOTE — Consults (Signed)
 Cardiology Consult Service - Initial Note  Date of Service: 11/15/2018    Reason for Consult: chest pain;   Primary care service attending: FIT  Outpatient Cardiologist: Dr. Selinda [facility]vels at [name]  Admission Date: 11/14/2018  Reason for Admission: chest pain    CC: chest pain  HPI:   Walter Rice is a 33 y/o Caucasian male with complex medical history of  AVSD s/p repair in 1989, with subsequent mechanical mitral valve replacement with a 27 mm mechanical prosthesis in 1993, s/p Medtronic dual-chamber permanent pacemaker, on long term warfarin use, medication non-compliance, prior stroke, and smoking, admitted to [facility] on 11-14-2018 with chest pain.     According to the patient, his symptoms started 1-2 days prior to admission when he developed intermittent episodes of chest discomfort which he described as left-sided, sharp in nature, and nonexertional. He stated that it would last only a few seconds and would resolve spontaneously. He states that it seems to be worse with coughing episodes, deep inspiration, and when he gets up. No associated shortness of breath/dizziness/lightheadedness/nausea/diaphoresis.    SHx: married, has three young children. Lives with his wife. Smoking. He has been noncompliant with warfarin use. He admitted to me that he missed 5 doses on average in one week in the past few months.     He lost follow up with [facility] (Dr. Selinda Bevels) since 2017. He has not follow-up with device in clinic either.     The patient is today seen bedside. The patient is feeling intermittent chest pain. The patient can walk 2 block without dyspnea. The patient has no palpitations, no syncope or lightheadedness. No leg edema, no PND or orthopnea. The patient has a pacemaker.     Chronic problems:   . Pacemaker [Z95.0] 07/02/2016    Medtronic dual chamber left sided endocardial system. 1999. 2008 gen change.   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       . Low back pain  [M54.5] 08/04/2014   . Weakness of trunk musculature [M62.81] 08/04/2014   . Hamstring tightness of both lower extremities [M62.9] 08/04/2014   . Midline low back pain without sciatica [M54.5] 08/04/2014   . 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     A) Status post repair with ventricular septal defect closure in February 1989.   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 Jude prosthetic valve in 1993.      . Sinus node dysfunction (HCC) [I49.5]   A) Status post Medtronic dual-chamber permanent pacemaker.      . 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      Review of Systems:   Constitutional: No malaise, fevers.  Eyes: No jaundice.   Skin: no break down, no cyanosis, no clubbing seen;  Respiratory: No upper respiratory symptoms, neck pain, productive cough,   Gastrointestinal: No abdominal distention or discomfort, melena, BRBPR.   Genitourinary: No hematuria.   Integumentary: No petechia or ecchymosis;   Neurological: No numbness or weakness of the extremities.   Endocrine: No polyuria, polydipsia;   Hematologic: No general weakness, nose bleeding;   Psych: No depression.     PMH:  Patient Active Problem List    Diagnosis Date Noted   . H/o congenital heart disease 11/15/2018   . Chest pain 11/14/2018   . Chronic anticoagulation 11/14/2018   . Cigarette nicotine dependence 11/14/2018   .  History of prosthetic mitral valve 08/08/2017   . History of CVA (cerebrovascular accident) 08/08/2017   . Blindness 08/08/2017   . Amaurosis fugax of left eye      PSH:  Past Surgical History:   Procedure Laterality Date   . ASD AND VSD REPAIR     . CARDIAC PACEMAKER PLACEMENT         Family History:  Family History   Problem Relation Age of Onset   . Heart disease Mother    . Diabetes Mother        Social History:  Social History     Socioeconomic History   . Marital  status: Married     Spouse name: None   . Number of children: None   . Years of education: None   . Highest education level: None   Occupational History   . None   Social Needs   . Financial resource strain: None   . Food insecurity:     Worry: None     Inability: None   . Transportation needs:     Medical: None     Non-medical: None   Tobacco Use   . Smoking status: Current Every Day Smoker     Packs/day: 0.50     Types: Cigarettes   . Smokeless tobacco: Never Used   Substance and Sexual Activity   . Alcohol use: Yes     Comment: once a year   . Drug use: No   . Sexual activity: None   Lifestyle   . Physical activity:     Days per week: None     Minutes per session: None   . Stress: None   Relationships   . Social connections:     Talks on phone: None     Gets together: None     Attends religious service: None     Active member of club or organization: None     Attends meetings of clubs or organizations: None     Relationship status: None   . Intimate partner violence:     Fear of current or ex partner: None     Emotionally abused: None     Physically abused: None     Forced sexual activity: None   Other Topics Concern   . None   Social History Narrative   . None     Social History     Social History Narrative   . Not on file       Allergies:  Allergies   Allergen Reactions   . Sulfa (Sulfonamide Antibiotics) Rash     Rash generalized (face arms backs)       Medications:  CURRENT HOME MEDICATIONS  No current facility-administered medications on file prior to encounter.      Current Outpatient Medications on File Prior to Encounter   Medication Sig Dispense Refill   . methocarbamol (ROBAXIN) 500 MG tablet Take 500 mg by mouth 2 (two) times a day as needed.     . warfarin (COUMADIN ) 5 MG tablet Take 5 mg by mouth once daily.     SABRA warfarin (COUMADIN ) 7.5 MG tablet Take 7.5 mg by mouth once daily.     . [DISCONTINUED] enoxaparin  (LOVENOX ) 80 mg/0.8 mL Syringe Inject 80 mg under the skin every 12 (twelve) hours.          CURRENT INPATIENT MEDICATIONS  Scheduled Meds:  . [START ON 11/16/2018] fentaNYL  100 mcg IntraVENous Once   . lidocaine   1 patch TransDermal Q24H   . [START ON 11/16/2018] midazolam   4 mg IntraVENous Once   . sodium chloride  10 mL IntraVENous Q8H   . warfarin per Pharmacy   Oral See Admin Instructions     Continuous Infusions:  PRN Meds:.acetaminophen , albuterol sulfate, Electrolyte Protocol - Med Surg, [START ON 11/16/2018] lidocaine , nitroglycerin, ondansetron     Vitals:  Blood pressure 116/64, pulse 62, temperature 36.6 C (97.9 F), temperature source Temporal Artery (forehead), resp. rate 18, height 188 cm (6' 2), weight 160 lb 15 oz (73 kg), SpO2 96 %. Body mass index is 20.66 kg/m.    Intake/Output Summary (Last 24 hours) at 11/15/2018 1744  Last data filed at 11/15/2018 1447  Gross per 24 hour   Intake 368 ml   Output 800 ml   Net -432 ml       General: alert and oriented to person, time and place. No acute distress.   Eyes: PERRL, conjunctiva/corneas normal, EOM's intact.  ENT and Mouth : oral  mucosa is moist. External  Ear and nose unremarkable   CV: JVD absent; regular rate and rhythm, S1, S2 normal, no S3 or S4 and diastolic murmur: mid diastolic 2/6, blowing at apex The point of maximal impulse is at the 5th intercostal space at the mid-clavicular line. There is no palpable heave and lift on exam. No hepatojugular reflex is appreciated. There are no abdominal or renal bruits audible. Carotid upstrokes are normal and 2+ bilaterally. Carotid bruits are absent. Extremities are warm and without evidence for decreased perfused. Capillary refill is less than 2 seconds. Dorsalis pedis and posterior tibial pulses are 2+ bilaterally.     Pulm: lungs are clear to auscultation bilaterally without rhonchi, rales or wheezing. Breath sounds are not diminished and effort is normal.   Abdomen: soft, no distention without evidence of ascites/fluid wave. Normal active bowel sounds.   Neuro: A and O times 3.  Non-focal   Skin: Cool, dry, normal color without rash or other lesion felt or seen.  Extremities: no edema; muscle strength 5/5.  Pysch: Appropriate mood and affect    LABORATORIES/TESTS  1) Lab / Pathology - I have reviewed the following: .   Laboratory Data:  Lab Results   Component Value Date    WBC 5.7 11/15/2018    HGB 12.9 (L) 11/15/2018    HCT 39 11/15/2018    MCV 95 11/15/2018    PLT 212 11/15/2018     Lab Results   Component Value Date    CREATININE 0.75 11/15/2018    BUN 14 11/15/2018    NA 138 11/15/2018    K 3.8 11/15/2018    CL 107 11/15/2018    CO2 26 11/15/2018     Lab Results   Component Value Date    ALT 17 08/02/2018    AST 23 08/02/2018    ALKPHOS 62 08/02/2018    BILITOT 0.5 08/02/2018     Lab Results   Component Value Date    INR 3.2 (H) 11/15/2018    INR 3.0 (H) 11/14/2018    INR 1.2 (H) 08/02/2018    PROTIME 36.3 (H) 11/15/2018    PROTIME 34.1 (H) 11/14/2018    PROTIME 14.3 (H) 08/02/2018     Lab Results   Component Value Date    TROPONINI 0.01 11/15/2018     No results found for: CHLPL  No results found for: CHOL  No results found for: HDL  No results found for: LDL  No  results found for: TRIG  No results found for: CHOLHDLRATIO  No results found for: HGBA1C  No results found for: TSH, TSH3, TSHHIGHSENSI     2) Radiology:   CT: see Epic.    3) Cardiac Tests:  See below.     4) Independent visualization of image, tracing or specimen itself (NOT REVIEW OF REPORT):      [ ]  ECG Tracing : 11-15-2018  Demand pacemaker; interpretation is based on intrinsic rhythm   Accelerated Junctional rhythm with retrograde conduction with Premature ventricular complexes or Fusion complexes     [ ]  ECHO Image:  11-15-2018  There is a mechanical prosthetic mitral valve.   The mechanic prosthetic mitral valve is not well visualized.   There is moderate mitral regurgitation.   The peak gradient is 16 mmHg. The mean gradient is 9 mmHg. Compared with   ECHO on 08-08-2017, MV gradient has increased from as of  mean   gradient, and from as of peak gradient. HR is 62 bpm today.;     There is mild to moderate aortic regurgitation.     There is moderate to severe tricuspid regurgitation.   Right ventricular systolic pressure is at 24 mmHg + right atrial pressure.     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   Recommend to have TEE to further study.     Normal left ventricular size and systolic function.   There is mild posterior wall hypertrophy.   The left ventricular ejection fraction is calculated at 72 %.   Diastolic function is difficult to assess with history of mitral valve   replacement surgery.   There are no significant segmental wall motion abnormalities.     The right ventricle is normal in size.   Right ventricular systolic function is normal.   There is a pacing wire in the right atrium and right ventricle.    [ ]  CATH image:    [ ]  STRESS tracing:    [ ]  CXR:     [ ]  OTHER CARDIAC TEST:      5) Independent REVIEW OF REPORTs:  [ ]  MEDICAL RECORD NUMBERfrom [facility] Inpatient Hospitalist.    Thank you very much for your request for cardiovascular consultation for Maxten Shuler. 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 He admitted to me that he missed 5 doses on average in one week in the past few months.     He lost follow up with [place] (Dr.  Selinda Bevels) since 2017. He has not follow-up with device in clinic either.     1) continue warfarin po for now;   2) Plan to have TEE tomorrow by Dr. Robert;  - tentatively set at 8am  tomorrow on 11-16-2018;   3) He might need to be transferred to [facility] pending TEE finding;  4) Educate patient about importance of medical compliance;     Thank you very much for the opportunity to be involved in cardiology care. Please page cardiology consulting service with any questions. We will continue to follow.      Alm Mana, MD, PhD, [facility]  Cardiology Service / Board Certified Heart Failure Cardiologist  [facility]  [phone] [address] [place]  Tel: [phone]  Fax: [phone]

## 2018-11-16 ENCOUNTER — Telehealth (HOSPITAL_BASED_OUTPATIENT_CLINIC_OR_DEPARTMENT_OTHER): Payer: Self-pay

## 2018-11-16 ENCOUNTER — Telehealth (HOSPITAL_BASED_OUTPATIENT_CLINIC_OR_DEPARTMENT_OTHER): Payer: Self-pay | Admitting: Interventional Cardiology

## 2018-11-16 NOTE — Telephone Encounter (Signed)
RETURN CALL: Voicemail - Detailed Message      SUBJECT:  Appointment Request     REASON FOR VISIT: Hospital Discharge [facility] discharge 11/16/2018, for chest pain, needs immediate follow up with provider or Valentina Lucks fellow. Please call patient.  PREFERRED DATE/TIME: ASAP  ADDITIONAL INFORMATION: no appointments in timeframe.

## 2018-11-16 NOTE — Telephone Encounter (Signed)
Requested records from [facility]    Phone# [phone]    Fax# [phone]

## 2018-11-16 NOTE — Telephone Encounter (Signed)
Pt had a TTE and TEE at [facility] on 2/10.    Message sent to MA to obtain images from these studies.

## 2018-11-16 NOTE — Telephone Encounter (Signed)
Left VM for pt asking for a call back.  [facility] office number provided.    Attempting to reach patient to discuss his symptoms and then to get scheduled for overdue clinic visit.

## 2018-11-23 NOTE — Telephone Encounter (Signed)
Tonette Bihari, CMA   Dohm, Ame; [place] [facility] Achd Pool 7 days ago        Requested.     -A     Routing comment         Dohm, Evette Georges, Amalyn, CMA; [place] [facility] Achd Pool 7 days ago        Hi Amalyn,     Could you please obtain the images from both a TEE and TTE that were completed at [facility] on 11/15/18?     Thanks!   Ame     Routing comment

## 2018-11-23 NOTE — Telephone Encounter (Signed)
Called to follow up on request. Was advise to contact [facility] directly. Was informed they are currently going through an system update. Tech will try to resend images electronically and if unable to will mail out a disc with the images.

## 2018-11-24 NOTE — Telephone Encounter (Signed)
Letter drafted and mailed to pt.  Pt needs follow up visit.   Forwarding to Dr. Acquanetta Chain to review TEE and TTE images.

## 2018-11-24 NOTE — Telephone Encounter (Signed)
Images received and sent to [employer].

## 2018-12-29 NOTE — Telephone Encounter (Signed)
Letter sent.

## 2019-02-22 ENCOUNTER — Other Ambulatory Visit (HOSPITAL_BASED_OUTPATIENT_CLINIC_OR_DEPARTMENT_OTHER): Payer: Self-pay | Admitting: Pharmacist

## 2019-02-23 NOTE — Telephone Encounter (Signed)
Per doc 11/11/18  Walter Rice is more than 90 days overdue for INR testing and followup with [facility].  He has not replied to multiple phone calls and letters. He has been discharged from service, with termination letter mailed today.      Dianna Limbo, PharmD

## 2019-03-08 ENCOUNTER — Other Ambulatory Visit (HOSPITAL_BASED_OUTPATIENT_CLINIC_OR_DEPARTMENT_OTHER): Payer: Self-pay | Admitting: Pharmacist

## 2019-03-17 ENCOUNTER — Telehealth (HOSPITAL_BASED_OUTPATIENT_CLINIC_OR_DEPARTMENT_OTHER): Payer: Self-pay

## 2019-03-17 NOTE — Telephone Encounter (Signed)
Hassan Rowan RN called pt to check in.    Call received from patient's wife, Bernadene Bell, wanting to schedule a visit with Dr. Charletta Cousin and EP.  Last seen Dr. Charletta Cousin Oct 2017 and EP April 2017.  Wife reports they will try to connect with local [place] clinic in [place].  Informed her we can assist with referral once he gets scheduled/seen by [facility].  Otherwise wife states pt will establish with PCP (visit 6/29) and go to urgent care to get Warfarin filled prior to appointments.  - Pt/wife will update [place] if [facility] referral needed.    Dr. Rolla Etienne visit 07/21/2016:  Instructions     Return in about 1 year (around 07/21/2017).  1.  Echocardiogram and clinic appointment in 1 year        - Forwarding patient and wife to front desk to assist with scheduling.

## 2019-03-17 NOTE — Telephone Encounter (Signed)
VM received from [facility] of Dr. Thelma Comp office.  They have not established care with pt yet but they can address concerns at this visit on 6/29.  Office asking if we are asking for referral to Cardiology?  Asking for Anticoag referral?  Call back [phone]

## 2019-03-17 NOTE — Telephone Encounter (Signed)
Call received from [facility] Pharmacist in [place] our office that they have officially discharged patient from their care.  [facility] sw/patient who informed them they will follow up locally as they live in [place], [place].    Feb 2020 last connected with [facility].  Officially discharged and [facility] advised pt on going to ER if they need urgent Warfarin prescription.

## 2019-03-17 NOTE — Telephone Encounter (Signed)
Called Jessica MA, lvm stating that an antiocag referral would be great but that the pt is seeing [facility] at the end of July.  Callback number left for any questions.

## 2019-03-28 ENCOUNTER — Inpatient Hospital Stay: Payer: Self-pay

## 2019-03-28 NOTE — ED Provider Notes (Signed)
 eMERGENCY dEPARTMENT eNCOUnter         Patient Name: Walter Rice  Medical Record Number: [id number]  Mode of Arrival The patient arrived by Car and is accompanied by spouse.  Primary Care Provider: DEBORAH LYNN GREENBERG, MD, MD    History Obtained From: patient and patient's wife  Time Seen: 10:51 AM   Limited by none      CHIEF COMPLAINT    Chief Complaint   Patient presents with   . Abdominal Pain     Abdominal pain for 5 days with vomiting 5 days ago.       HPI    Walter Rice is a 33 y.o. male with a history of congenital heart disease, mechanical mitral valve replacement and CVA who presents complaining of RLQ abdominal pain over the past five days. The patient has associated decreased appetite, nausea and vomiting. The patient denies any dysuria, diarrhea, falls, shortness of breath, cough, back pain, or any other symptoms at this time. The patient's wife reports that the patient had a telemedicine appointment four days ago and was told he may have appendicitis. The patient reports that he had his INR checked last week and had his warfarin dose altered slightly. The patient's wife reports that there is a small bruise to the patient's abdomen secondary to her giving him Lovenox . The patient's wife denies any life style changes. The patient denies any contact with COVID positive patients. The patient endorses 0.5 ppd tobacco use for the past 14 years. The patient has no other complaints at this time.    COVID-19 Crisis   This encounter occurred during the pandemic COVID 19 crisis.  All efforts were made to address emergencies and minimize the patient's potential exposure to infection.     Appropriate PPE worn during all interactions with patient to include:     [] CAPR  [] Faceshield  [] Gown  [x] N95  [x] Surgical mask  [x] Gloves  [x] Goggles    PAST MEDICAL HISTORY    Past Medical History:   Diagnosis Date   . Congenital heart disease     Atrio ventrio septal defect, s/p VSD repair, valve  replacement   . CVA (cerebral vascular accident) (HCC)     2007, 2008, 2009   . Hx of mitral valve replacement with mechanical valve     INR target 2.5-3.5   . 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 1999. 2008 gen change   . Tachycardia        SURGICAL HISTORY    Past Surgical History:   Procedure Laterality Date   . ASD AND VSD REPAIR     . CARDIAC PACEMAKER PLACEMENT     . ECHOCARDIOGRAM TRANSESOPHAGEAL 3D         CURRENT MEDICATIONS    I have reviewed the patient's medications.  Please see nursing and pharmacy records for the most up to date list.     Home Medication List as Reviewed in Epic [name] 11/14/18 1927   Medication Instructions   methocarbamol (ROBAXIN) 500 MG tablet 500 mg, Oral, 2 times daily PRN   warfarin (COUMADIN ) 5 MG tablet 5 mg, Oral, Daily   warfarin (COUMADIN ) 7.5 MG tablet 7.5 mg, Oral, Daily       ALLERGIES    Allergies   Allergen Reactions   . Sulfa (Sulfonamide Antibiotics) Rash     Rash generalized (face arms backs)       FAMILY HISTORY    Family History  Problem Relation Age of Onset   . Heart disease Mother    . Diabetes Mother        SOCIAL HISTORY    Social History     Socioeconomic History   . Marital status: Married     Spouse name: None   . Number of children: None   . Years of education: None   . Highest education level: None   Occupational History   . None   Social Needs   . Financial resource strain: None   . Food insecurity:     Worry: None     Inability: None   . Transportation needs:     Medical: None     Non-medical: None   Tobacco Use   . Smoking status: Current Every Day Smoker     Packs/day: 0.50     Types: Cigarettes   . Smokeless tobacco: Never Used   Substance and Sexual Activity   . Alcohol use: Yes     Comment: once a year   . Drug use: No   . Sexual activity: None   Lifestyle   . Physical activity:     Days per week: None     Minutes per session: None   . Stress: None   Relationships   . Social connections:     Talks on phone: None      Gets together: None     Attends religious service: None     Active member of club or organization: None     Attends meetings of clubs or organizations: None     Relationship status: None   . Intimate partner violence:     Fear of current or ex partner: None     Emotionally abused: None     Physically abused: None     Forced sexual activity: None   Other Topics Concern   . None   Social History Narrative   . None         REVIEW OF SYSTEMS    A complete review of systems was otherwise negative except as marked in the HPI    PHYSICAL EXAM    VITAL SIGNS:  BP 113/64 (BP Location: Left arm, Patient Position: Sitting)   Temp 36.9 C (98.4 F) (Temporal)   Resp 16   Ht 6' 1 (1.854 m)   Wt 73.5 kg (162 lb)   SpO2 97%   BMI 21.37 kg/m      Vital signs are reviewed by me.  The initial pulse oximetry was interpreted as being normal for this patient.  The initial blood pressure was interpreted as being normotensive for this patient.    Constitutional:  No acute distress, non toxic appearing. Appears well developed and well nourished.  HENT:  Normocephalic, atraumatic, bilateral external ears normal. Neck exam notes normal range of motion, no tenderness, supple, no stridor or tracheal deviation.   Eyes:  Pupils equal and round, conjunctiva normal, no discharge. No scleral icterus.  Cardiovascular:  3/6 systolic ejection murmur, loudest at LSB  Pulmonary/Chest:  Able to speak full sentences.  No respiratory distress. Effort normal and breath sounds normal.  No wheezing, rhonchi or rales noted.  No chest tenderness.  GI:  Bowel sounds normal, soft,  non rigid, non distended, no organomegaly, no pulsatile masses, no rebound noted. Reducible umbilical hernia. Tenderness and guarding to RLQ.  GU:  Refused  Musculoskeletal:  Intact distal pulses, no tenderness, no cyanosis, no clubbing. Good range of motion in all  major joints. No tenderness to palpation or deformities noted.  Skin:  Warm, dry, no erythema, no rash, no  lesions.   Back:  No midline vertebral tenderness, no CVA tenderness.   Neurologic:  Alert and oriented x 3, normal motor function, normal sensory function, no focal deficits noted, CN II-XII grossly intact bilaterally.     ECG    Results for orders placed or performed during the hospital encounter of 03/28/19   ECG 12 lead   Result Value Ref Range    MUSE VENTRICULAR RATE 127 BPM    MUSE ATRIAL RATE 61 BPM    MUSE QRS DURATION 110 ms    MUSE Q-T INTERVAL 214 ms    MUSE QTC CALCULATION 311 ms    MUSE CALCULATED R 115 degrees    MUSE CALCULATED T -32 degrees    Muse Diagnosis       Demand pacemaker; interpretation is based on intrinsic rhythm  Abnormal ECG  Electronic atrial pacemaker  Abnormal ECG  When compared with ECG of 15-Nov-2018 10:34,  No significant change was found  Confirmed by CALIMLIM, AMY ([id number]) on 03/28/2019 11:29:58 AM           RADIOLOGY/LABS    Ct Abdomen Pelvis With Contrast    Result Date: 03/28/2019  EXAM: Contrast-enhanced CT of the abdomen and pelvis HISTORY: 33 year old man with right lower quadrant pain TECHNIQUE: Standard, contrast-enhanced CT of the abdomen and pelvis after the uneventful intravenous administration of 75 cc of Isovue-370 contrast material. In accordance with CT policies/protocols and the ALARA principle, radiation dose reduction techniques (such as automated exposure control, adjustment of mA/kV according to patient size and/or iterative reconstruction technique) were utilized for this examination. COMPARISON: None. FINDINGS: Lung bases: No pleural or pericardial effusion. Heart size is normal. Transvenous pacemaker leads are partially seen. Epicardial pacing lead is also noted. CT Abdomen: Diffusely decreased attenuation of liver relative to spleen may be seen with hepatic steatosis. Normal gallbladder, pancreas, spleen, and adrenal glands. Normal kidneys, without hydronephrosis. Small and large bowel are normal in caliber without evidence of obstruction. Appendix is not  definitively visualized, but no pericecal inflammatory changes are seen. There is no evidence of free air, free fluid, or abnormal lymph node enlargement within the abdomen. There is a small fat-containing periumbilical hernia. CT Pelvis: Grossly normal appearance of the urinary bladder. No evidence of free fluid, free air, or abnormal lymph node enlargement within the pelvis. No suspicious lytic or blastic osseous lesions.     1. No evidence of acute abnormality in the abdomen or pelvis. The appendix is not definitively visualized, but no secondary signs of acute appendicitis are seen. Final result by Reyes SHAUNNA Farr, MD (03/28/19 12:33:56)    Results for orders placed or performed during the hospital encounter of 03/28/19   CBC AND DIFFERENTIAL   Result Value Ref Range    WBC 7.0 3.8 - 11.0 K/mcL    RBC 4.22 4.20 - 5.70 M/mcL    HEMOGLOBIN 13.5 13.2 - 17.0 g/dL    Hematocrit 40 39 - 50 %    MCV 96 80 - 100 fL    MCH 32.0 27.0 - 34.0 pg    MCHC 33.4 32.0 - 35.5 g/dL    RDW 87.1 88.9 - 84.4 %    Platelets 197 150 - 400 K/mcL    MPV 9.0 7.5 - 11.2 fL    Nucleated RBC 0 0 - 0 %    nRBC# 0.00 10*3/uL  Neutrophils % 66 38 - 70 %    Lymphocytes % 26 15 - 48 %    Monocytes % 6 0 - 12 %    Eosinophils % 1 0 - 7 %    Basophils % 1 0 - 2 %    Neut# 4.6 1.9 - 7.4 K/mcL    Lymph# 1.8 1.0 - 3.9 K/mcL    Monos# 0.4 0.0 - 0.8 K/mcL    Eos# 0.1 0.0 - 0.5 K/mcL    Baso# 0.0 0.0 - 0.1 K/mcL    Immature Granulocytes % 0 %    Immature Granulocytes Absolute 0.0 K/mcL   Comprehensive metabolic panel   Result Value Ref Range    Sodium 139 135 - 145 mmol/L    Potassium 4.1 3.5 - 5.3 mmol/L    Chloride 106 99 - 109 mmol/L    CO2,Total 26 22 - 31 mmol/L    Anion Gap 7 5 - 16 mmol/L    Glucose 98 65 - 99 mg/dL    Calcium 8.9 8.5 - 89.7 mg/dL    BUN 12 8 - 25 mg/dL    Creatinine 9.03 9.29 - 1.30 mg/dL    BUN/Creatinine Ratio 13 7 - 24    GFR if African American >90 >=60 mL/min/1.71m2    GFR if not African American >90 >=60 mL/min/1.83m2     Osmolality, Calculated 277 266 - 309 mOsm/K    Protein,Total 7.1 6.1 - 8.4 g/dL    Albumin 4.3 3.5 - 5.0 g/dL    Globulin 2.8 1.8 - 3.5 g/dL    A/G Ratio 1.5 1.1 - 2.2    Bilirubin,Total 0.6 0.1 - 1.5 mg/dL    Alkaline Phosphatase 58 35 - 115 IU/L    ALT (SGPT) 18 10 - 65 U/L    AST (SGOT) 23 10 - 45 U/L   Troponin I   Result Value Ref Range    Troponin I 0.01 0.00 - 0.04 ng/mL   B-type natriuretic peptide   Result Value Ref Range    BNP 107 (H) 0 - 99 pg/mL   PROTIME-INR   Result Value Ref Range    Protime 28.6 (H) 9.3 - 12.2 sec    INR 2.5 (H) 0.8 - 1.1   LIPASE   Result Value Ref Range    Lipase 75 (H) 7 - 60 U/L   Rapid drug screen, urine   Result Value Ref Range    Amphetamine Screen,Urine Not Detected Not Detected    Barbiturates Screen,Urine Not Detected Not Detected    Benzodiazepines Screen, Urine Not Detected Not Detected    Cocaine Metabolites,Urine Not Detected Not Detected    Methadone Screen,Urine Not Detected Not Detected    Opiates Screen,Urine Not Detected Not Detected    Oxycodone Screen,Urine Not Detected Not Detected    PCP Screen,Urine Not Detected Not Detected    Cannabinoid Screen,Urine Not Detected Not Detected    Creatinine,Urine 268 (No normals established) mg/dL   Urinalysis with culture, if indicated   Result Value Ref Range    Color Yellow Colorless, Straw, Yellow    Clarity Clear Clear    Glucose Negative Negative    Protein Negative Negative, Trace    Bilirubin Negative Negative    Urobilinogen <2.0 <2.0 mg/dL mg/dL    pH 6.0 5.0 - 8.0    Blood Negative Negative    Ketones Urine Negative Negative    Nitrite Negative Negative    Leukocyte Esterase Negative Negative    Ascorbic  Acid Negative Negative    Specific Gravity 1.025 1.001 - 1.030   Lactic acid, venous   Result Value Ref Range    Lactate 0.8 0.5 - 2.0 mmol/L   Procalcitonin   Result Value Ref Range    Procalcitonin <0.05 0.00 - 0.49 ng/mL   ECG 12 lead   Result Value Ref Range    MUSE VENTRICULAR RATE 127 BPM    MUSE ATRIAL RATE  61 BPM    MUSE QRS DURATION 110 ms    MUSE Q-T INTERVAL 214 ms    MUSE QTC CALCULATION 311 ms    MUSE CALCULATED R 115 degrees    MUSE CALCULATED T -32 degrees    Muse Diagnosis       Demand pacemaker; interpretation is based on intrinsic rhythm  Abnormal ECG  Electronic atrial pacemaker  Abnormal ECG  When compared with ECG of 15-Nov-2018 10:34,  No significant change was found  Confirmed by CALIMLIM, AMY ([id number]) on 03/28/2019 11:29:58 AM         PROCEDURE    None    GUIDELINES  As part of the 2020 MIPS reporting requirements, the following measurers have been reviewed and documented: N/A       COVID-19 Screening  The patient presented with lower respiratory illness symptoms and was evaluated for possible COVID-19. The patient was screened as:     []  Positive concern:  Laboratory testing was completed. The patient's risk factors include:   The patient was  admitted and pending test was signed out to the admitting team.  The patient was  discharged and patient advised to quarantine as recommended by the CDC guidelines and return precautions were given for development of worsening symptoms or any other concerns.     [x]  Negative Concern: The patient was informed of the negative screen at this time and return precautions were given for development of worsening symptoms or any other concerns.       ED COURSE & MEDICAL DECISION MAKING    DATA GATHERING   The patient was seen and evaluated by myself.   I reviewed the nurses notes and flow sheets.   Prior EMR records reviewed in EPIC as available and clinically relevant.  Labs & Imaging studies reviewed.    3:22 PM  Vitals:    03/28/19 1454   BP: (P) 124/72   Pulse: (P) 63   Resp: (P) 16   Temp: (P) 36.3 C (97.3 F)   SpO2: (P) 97%     BP  Min: 113/64  Max: 124/72    Current Vital Signs at 3:22 PM    Temp: (P) 36.3 C (97.3 F)  Heart Rate: (P) 63  Resp: (P) 16  BP: (P) 124/72    Pre-hypertension/Hypertension: I discussed with the patient that they may have pre-hypertension  or Hypertension based on a blood pressure reading in the emergency department. I have recommended to the patient that they call their primary care provider when discharged to arrange follow up for further evaluation of possible pre-hypertension or hypertension. Readings of high blood pressure while under stress or in pain in the Emergency Department are not sufficient for the diagnosis of hypertension and require follow up.      DDX: MALE ABDOMINAL PAIN  I have considered the following conditions during my assessment of this patient: Abdominal pain, flank pain, vomiting, diarrhea, gastritis, colitis, ulcer, abdominal aortic aneurysm, myocardial infarction acute, appendicitis, bowel obstruction, hernia, cholecystitis, Clostridium difficile associated disease, diverticulitis, ischemic colitis, mesenteric  ischemia, pancreatitis, pyelonephritis, sepsis, testicular torsion, ureterolithiasis, UTI.      The differential diagnosis and/or critical care lists were considered including infections, sepsis, severe sepsis and septic shock and found unlikely unless documented in the final clinical impression.     ASSESSMENT: Walter Rice is a 33 y.o.male c/o RLQ abdominal pain with associated nausea, vomiting and reduced appetite that began five days ago.    PLAN:     Orders   . Blood culture   . COVID19 ([place])   . CT Abdomen Pelvis with Contrast   . CBC AND DIFFERENTIAL   . Comprehensive metabolic panel   . Troponin I   . B-type natriuretic peptide   . PROTIME-INR   . LIPASE   . Rapid drug screen, urine   . Urinalysis with culture, if indicated   . Lactic acid, venous   . Procalcitonin   . Diet NPO   . Cardiac monitoring   . Frequent vital signs   . Notify provider   . Inpatient consult to general surgery   . Pulse oximetry, continuous   . ECG 12 lead   . Type and screen   . ABO RH2   . Insert peripheral IV   . ED Admit to Inpatient   . ED to Hospital Bed Request   . morphine 4 mg/mL syringe 4 mg   . ondansetron  (ZOFRAN )  4 mg/2 mL injection 4 mg   . famotidine (PEPCID) injection 20 mg   . iopamidoL (ISOVUE-370) 76 % injection 100 mL   . morphine 4 mg/mL syringe 4 mg   . sodium chloride 0.9% (NS) infusion   . piperacillin-tazobactam (ZOSYN) 4.5 g in sodium chloride 0.9 % 100 mL IVPB       Administered Meds       Date/Time Order Dose Route Action Comments     03/28/2019 1114 morphine 4 mg/mL syringe 4 mg 4 mg IntraVENous Given      03/28/2019 1114 famotidine (PEPCID) injection 20 mg 20 mg IntraVENous Given      03/28/2019 1209 iopamidoL (ISOVUE-370) 76 % injection 100 mL 75 mL IntraVENous Given      03/28/2019 1311 morphine 4 mg/mL syringe 4 mg 4 mg IntraVENous Given             CLINICAL IMPRESSION:  1. RLQ abdominal pain    2. Anticoagulated on warfarin    3. Hx of cardiac pacemaker    4. Hx of mitral valve replacement with mechanical valve    5. Nausea and vomiting, intractability of vomiting not specified, unspecified vomiting type        History and physical consistent with RLQ abdominal pain with a history of a pacemaker, mechanical mitral valve replacement, and anticoagulated on warfarin. Patient was given the following interventions in the ED including morphine, Zofran  and Pepcid with minimal relief.  Diagnostic tests noted appendix not visualized on CT with no secondary signs of appendicitis noted on CT abdomen/pelvis; CBC within normal limits and troponin negative.  However, on recheck, patient still has significant guarding of palpation of RLQ therefore contacted surgeon on call Dr Clinton who recommended keeping patient NPO and admit for serial abdominal exams.  He recommends starting Zosyn and holding coumadin  with heparin drip as bridge until he evaluates the patient and determining if he is a surgical candidate.      Patient admitted to hospital for further evaluation and treatment. Discussed patient with [facility] physician Dr. Trisha and care transferred to the inpatient team.  Pending studies signed out for [facility] to  follow up.      PATIENT CARE TIMELINE:  1201: Attempted to check on patient who is at CT currently.  1300:  Rechecked on patient who is resting comfortably. Discussed known results and reexamined the patient. Patient is still tender to palpation with guarding. No new complaints and all questions answered at this time. Page to General Surgery  1306: Discussed patient's case with Dr. Jannis (General Surgery). Reported the patient's history, exam, labs, imaging results, and plan. Dr. Jannis recommends admitting the patient, giving him zosyn, keeping him NPO, and stopping his warfarin and starting a heparin drip. Dr. Jannis agrees to consult with the patient upon admission to determine further care.   1314: Discussed patient's case with Dr. Trisha (FIT). Reported the patient's history, exam, labs, and imaging results. Dr. Trisha agrees to admit the patient under inpatient status in medical telemetry bed.   1324: Recheck with patient and explained lab, imaging, and exam results. Explained plan for admission into [facility] for inpatient care. Patient deferred GU exam at this time. Patient understands and is agreeable with plan. All questions answered.      ADMISSION:    DISPOSITION     Hospital Admission or Observation    Walter Rice will be admitted to the [facility] Inpatient Team under the care of admitting physician, Dr. Trisha,  at 3:22 PM after discussing the possibility of staying less than 2 midnights, the following is the final decision of the inpatient provider:  []   Observation [x]   Inpatient status     The clinical presentation, vital signs, ED course and treatment and the results of imaging and laboratory tests were discussed with the admitting physician. Proper admitting orders will be provided by the admitting physician. The patient will be transferred to the appropriate inpatient destination when the bed is available. Care transferred to the inpatient team and pending studies  will be signed out to the accepting physician to follow up.     I discussed the results of ED evaluation and treatment in detail with the patient and attending family members.  Plan for further management were discussed and questions answered.  The patient agrees to the treatment plan.    Condition on admission:    [x]   Stable    []   Improved  []   Guarded    []   Worsened  []   Critical     Electronically signed by No att. providers found    03/28/2019  10:42 AM      CODE STATUS:  FULL    [x]  CODE STATUS:  The patient's code status and advanced care planning was discussed with the Patient.  Their code status at this time is Full Code.  A total of 1 minute was spent exclusive of other billable procedures discussing the patients end of life care.      [x]  Power of attorney or next of kin: Quientin Jent, Wife, contact information: [phone]    HISTORY OF MRSA: NO  FALL PRECAUTIONS: NO    I, Harlene Mayer, am serving as a Neurosurgeon to document services personally performed by Dr Marston based on my observation and the provider's statements to me.    Electronically signed by Harlene Mayer, Scribe 03/28/2019 3:22 PM      I personally performed the services described in this documentation, reviewed and edited the documentation which was dictated to the scribe in my presence and it accurately records my words and actions.  Electronically signed by Greig Gun DO    This document was generated in part using voice recognition software, occasional wrong- word or sound-a-like substitutions may have occurred due to the inherent limitations of voice recognition software. Read the chart carefully and recognize, using context, where the substitutions have occurred. Although every effort was made to edit the content, transcription and typing errors may occur.               Amy M Calimlim, DO  03/29/19 1116

## 2019-03-29 ENCOUNTER — Encounter (HOSPITAL_BASED_OUTPATIENT_CLINIC_OR_DEPARTMENT_OTHER): Payer: Self-pay

## 2019-05-01 NOTE — Progress Notes (Signed)
This encounter was opened in error.

## 2019-05-02 ENCOUNTER — Encounter (HOSPITAL_BASED_OUTPATIENT_CLINIC_OR_DEPARTMENT_OTHER): Payer: Self-pay | Admitting: Internal Medicine

## 2019-05-02 ENCOUNTER — Encounter (HOSPITAL_BASED_OUTPATIENT_CLINIC_OR_DEPARTMENT_OTHER): Payer: No Typology Code available for payment source | Admitting: Interventional Cardiology

## 2019-05-02 ENCOUNTER — Encounter (HOSPITAL_BASED_OUTPATIENT_CLINIC_OR_DEPARTMENT_OTHER): Payer: Self-pay

## 2019-05-02 NOTE — Progress Notes (Signed)
**  Documentation only- patient no-showed for his visit.**      Interval History    Walter Rice is a 33 year old male with a past medical history of complete AVSD and PAPVR who underwent VSD atrial and ventricular septation at 39.33 years of age followed by mechanical mitral valve replacement with a 27 mm [employer] at age 47 years in the setting of severe MR. 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     He no-showed for his appointment in 2018. The last time he as seen in [facility] clinic was 07/21/16 at which point he reported doing well s/p generator replacement 6 months prior. Serial echocardiograms have shown that he has aortic valve sclerosis with mild-moderate AI, mild-moderate LV enlargement with normal function despite inferior hypokinesis. He also has moderate TR despite normal pulmonary pressures.     Per documentation, [name] went to the ER 11/14/18 for chest pain. Cardiac enzymes and EKG were negative for ACS. He was admitted and a TTE was performed showing EF 72%, moderate MR, AR, and TR, 9 mm Hg gradient across his mechanical MV, as well as a 2.9 x 2.5 cm thrombus in the LA. A TEE was performed and showed no evidence of thrombus in LA; normally functioning mechanical MV, mild AI, and no ASD. He was discharged home 11/16/18 and advised to follow up with Cardiology but patient was not reachable prior to current visit.

## 2019-05-03 ENCOUNTER — Telehealth (HOSPITAL_BASED_OUTPATIENT_CLINIC_OR_DEPARTMENT_OTHER): Payer: Self-pay

## 2019-05-03 NOTE — Telephone Encounter (Signed)
Pt did not show for his appointment 7/27.    Message sent to front desk to reschedule.

## 2019-05-31 NOTE — Telephone Encounter (Signed)
Walter Rice called back stating he only has enough Warfarin to last until tonight 05/31/19. Wondering if we can refill for him. I was unable to reach Rn at time of call, sending as urgent [place]

## 2019-05-31 NOTE — Telephone Encounter (Signed)
Ecare to pt with [email or web address] website for when pt transfers care.  Gave [facility] RN contact information if pt has questions for Dr. Rush Barer.

## 2019-05-31 NOTE — Telephone Encounter (Signed)
Called and spoke w/ patient to see if he wants to reschedule no show apt in July with Dr. Rolla Etienne. Pt states that he has moved to [facility]. He has not yet transferred care but he is planning on it.    FYI I sent in a help ticket to because 05/02/19 apt is marked as completed on the schedule, I asked them to change the status to "no show"

## 2019-06-01 ENCOUNTER — Telehealth (HOSPITAL_BASED_OUTPATIENT_CLINIC_OR_DEPARTMENT_OTHER): Payer: Self-pay | Admitting: Interventional Cardiology

## 2019-06-01 ENCOUNTER — Telehealth (HOSPITAL_BASED_OUTPATIENT_CLINIC_OR_DEPARTMENT_OTHER): Payer: Self-pay

## 2019-06-01 NOTE — Telephone Encounter (Signed)
Patient is calling to request Warfin refill. Also left message at he nurse's line.

## 2019-06-01 NOTE — Telephone Encounter (Signed)
Pt left VM, asking for a callback to discuss refills needed.    Looking through his chart, unsure if the pt has relocated.

## 2019-06-02 ENCOUNTER — Other Ambulatory Visit (HOSPITAL_BASED_OUTPATIENT_CLINIC_OR_DEPARTMENT_OTHER): Payer: Self-pay | Admitting: Pharmacist

## 2019-06-02 DIAGNOSIS — Q212 Atrioventricular septal defect, unspecified as to partial or complete: Secondary | ICD-10-CM

## 2019-06-02 DIAGNOSIS — Z7901 Long term (current) use of anticoagulants: Secondary | ICD-10-CM

## 2019-06-02 MED ORDER — WARFARIN SODIUM 5 MG OR TABS
7.5000 mg | ORAL_TABLET | Freq: Every day | ORAL | 0 refills | Status: DC
Start: 2019-06-02 — End: 2021-08-03

## 2019-06-02 NOTE — Telephone Encounter (Signed)
Message received from [facility].  An email was sent to the pt after no ability to leave a VM.  ACC advised that the pt has been d/c'd from their service due to lack of follow up, overdue more than 90 days.

## 2019-06-02 NOTE — Progress Notes (Signed)
Spoke to [employer] GF today who states they are having trouble establishing care in [place]. [place] been able to f/u with anyone. I advised for her to call [facility] to see if they can help find a local cardiologist. In the meantime, he will run out of warfarin. Thus I sent an emergency prescription for 15 tablets to a local [facility] and suggested they find someone to manage his anticoagulation soon.    Lyman Bishop, PharmD

## 2019-06-02 NOTE — Telephone Encounter (Signed)
Please note: [place] (front desk) forwarded message regarding Warfarin as this nurse was sending ecare message.  System kicked me out and I sent a second "corrected" message.   - See TE 06/01/2019.  [facility] RN LVM for pt returning call on refill request.

## 2019-06-02 NOTE — Telephone Encounter (Signed)
eCare sent to pt about establishing with local [facility] provider.

## 2019-06-02 NOTE — Telephone Encounter (Addendum)
Lyman Bishop, PharmD at 06/02/2019 2:55 PM    Status: Signed      Spoke to [employer] today who states they are having trouble establishing care in [place]. [place] and have not been able to f/u with anyone. I advised for her to call [facility] to see if they can help find a local cardiologist. In the meantime, he will run out of warfarin. Thus I sent an emergency prescription for 15 tablets to a local [facility] and suggested they find someone to manage his anticoagulation soon.    Lyman Bishop, PharmD

## 2019-06-02 NOTE — Telephone Encounter (Signed)
Call placed returned to pt/unable to reach pt.  Left detailed VM with [facility] RN contact number as well as [place] [facility] phone number.  Asked pt to call us ASAP.    Left VM for [place] [facility] to see if they are able to assist.  Unsure if pt is still in [place] or has moved to [place] as previous notes suggest.

## 2019-06-12 ENCOUNTER — Other Ambulatory Visit (HOSPITAL_BASED_OUTPATIENT_CLINIC_OR_DEPARTMENT_OTHER): Payer: Self-pay | Admitting: Pharmacist

## 2019-06-12 DIAGNOSIS — Z7901 Long term (current) use of anticoagulants: Secondary | ICD-10-CM

## 2020-11-15 IMAGING — CR DX Chest X-Ray Portable
1 series · 1 of 1 positions shown · non-contrast
Comparison: None.

EXAMINATION: CHEST RADIOGRAPH SINGLE VIEW.
HISTORY: Chest pain.

[ap no grid]
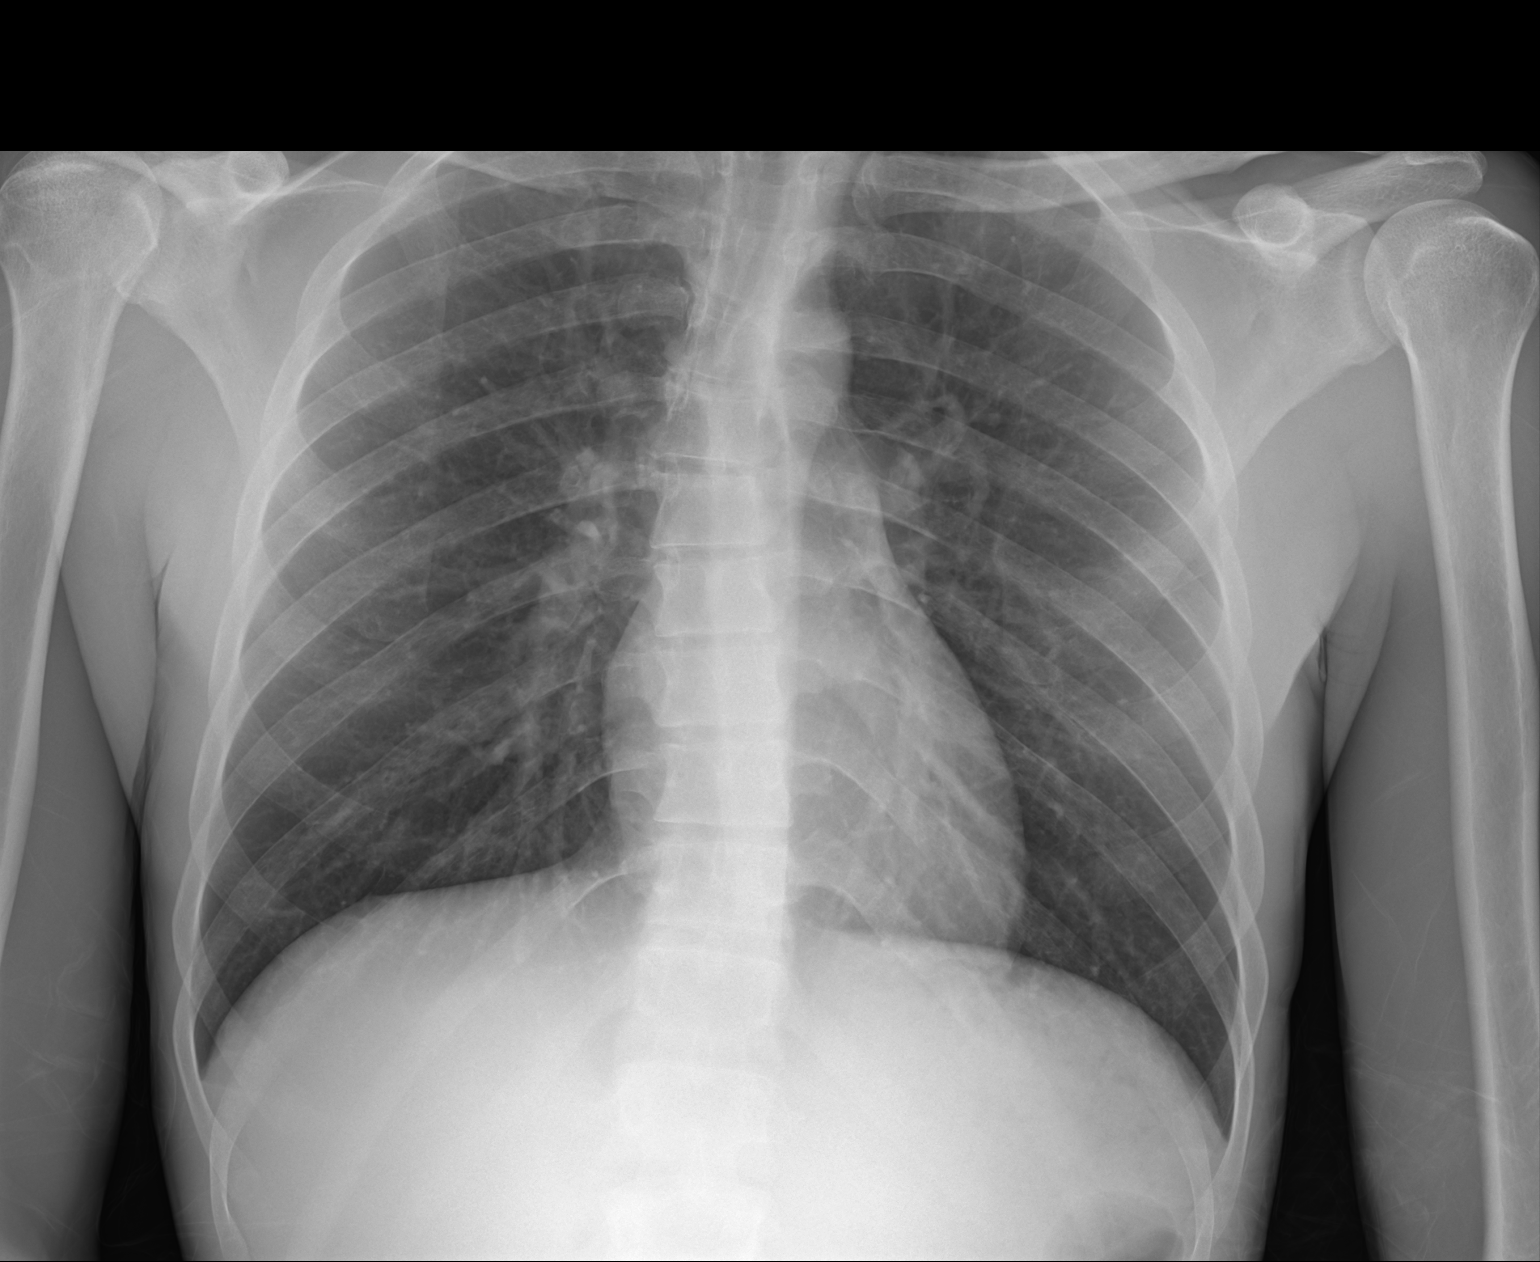

[1 of 1 positions shown; findings below may reference images not displayed]

FINDINGS: Heart is normal in size. Lungs are normally expanded and clear.                 
 Negative for acute osseous abnormality. Mild S-shaped lateral thoracic                    
 curvature                                                                                 
 noted.                                                                                    
 ___________
IMPRESSION: Negative for acute intrathoracic process.

## 2021-02-15 ENCOUNTER — Telehealth (HOSPITAL_BASED_OUTPATIENT_CLINIC_OR_DEPARTMENT_OTHER): Payer: Self-pay | Admitting: Cardiovascular Disease

## 2021-02-15 NOTE — Telephone Encounter (Signed)
RETURN CALL: Voicemail - Detailed Message      SUBJECT:  General Message     MESSAGE:     Patient was a long time patient of Dr Maretta Los. 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 He's had surgery through [facility] and later at [facility].     He is seeking a referral from his most recent cardiologist in [place] now that he's moved back to [place].    Patient needs to have batteries changed out every 10 years and would like to reestabish care with Dr Valentina Lucks.     Please contact patient if he's able to resume his care sans referral.    Thank you

## 2021-02-25 ENCOUNTER — Other Ambulatory Visit (HOSPITAL_BASED_OUTPATIENT_CLINIC_OR_DEPARTMENT_OTHER): Payer: Self-pay | Admitting: Nurse Practitioner

## 2021-02-25 DIAGNOSIS — Z950 Presence of cardiac pacemaker: Secondary | ICD-10-CM

## 2021-02-25 DIAGNOSIS — Z952 Presence of prosthetic heart valve: Secondary | ICD-10-CM

## 2021-02-25 DIAGNOSIS — Q212 Atrioventricular septal defect, unspecified as to partial or complete: Secondary | ICD-10-CM

## 2021-02-25 DIAGNOSIS — I495 Sick sinus syndrome: Secondary | ICD-10-CM

## 2021-02-25 DIAGNOSIS — I472 Ventricular tachycardia: Secondary | ICD-10-CM

## 2021-02-25 DIAGNOSIS — I4729 Other ventricular tachycardia: Secondary | ICD-10-CM

## 2021-04-23 ENCOUNTER — Telehealth (HOSPITAL_BASED_OUTPATIENT_CLINIC_OR_DEPARTMENT_OTHER): Payer: Self-pay | Admitting: Cardiovascular Disease

## 2021-04-23 NOTE — Telephone Encounter (Signed)
LM for Walter Rice to ask if there is active insurance yet and asked for a call back asap

## 2021-04-25 ENCOUNTER — Telehealth (HOSPITAL_BASED_OUTPATIENT_CLINIC_OR_DEPARTMENT_OTHER): Payer: Self-pay | Admitting: Cardiovascular Disease

## 2021-04-25 ENCOUNTER — Encounter (HOSPITAL_BASED_OUTPATIENT_CLINIC_OR_DEPARTMENT_OTHER): Payer: Self-pay | Admitting: Cardiology Med

## 2021-04-25 ENCOUNTER — Encounter (HOSPITAL_COMMUNITY): Payer: Self-pay

## 2021-04-25 NOTE — Telephone Encounter (Signed)
PT gave me a call to let me know there is unfortunately a positive c19 test result. I informed we will have to wait 20 days before getting pt back in and [place] understands. Walter Rice has been dealing with a high fever  But since pt is not established, our nurses unfortunately are unable to give advice. It was suggested to reach out to PCP but pt has yet to establish with PCP as well due to covid. I let Walter Rice know should things get worse or the high fever continutes, may be a good idea to be seen at urgent care or ed, our nurses stated the pcp may be able to give better advice.

## 2021-05-09 ENCOUNTER — Telehealth (INDEPENDENT_AMBULATORY_CARE_PROVIDER_SITE_OTHER): Payer: Self-pay | Admitting: Student in an Organized Health Care Education/Training Program

## 2021-05-28 ENCOUNTER — Telehealth (HOSPITAL_BASED_OUTPATIENT_CLINIC_OR_DEPARTMENT_OTHER): Payer: Self-pay | Admitting: Cardiovascular Disease

## 2021-05-28 NOTE — Telephone Encounter (Signed)
Valente gave me a call to ask about appt time/date. I asked if PT has insurance yet but PT stated not yet and is hoping to get medicaid again.  xfer PT tp financial assistance to get the ball rolling on this since appt is coming up

## 2021-06-06 ENCOUNTER — Telehealth (HOSPITAL_COMMUNITY): Payer: Self-pay

## 2021-06-06 ENCOUNTER — Telehealth (HOSPITAL_BASED_OUTPATIENT_CLINIC_OR_DEPARTMENT_OTHER): Payer: Self-pay | Admitting: Cardiovascular Disease

## 2021-06-06 NOTE — Telephone Encounter (Signed)
SOCIAL WORK FOLLOW-UP NOTE    06/06/2021  Brief description: Pt contacted the SW line to have a medicaid application submitted. Pt moved back to [place] from [place] and does not have financial coverage at this time.    Plan: SW advised pt to go on [email or web address] to complete an application and provide supportive financial documentation.    Montel Culver, MSW  Social Work float   [employer]

## 2021-06-06 NOTE — Telephone Encounter (Signed)
Spoke with Claud and asked about insurance - still doesn't have it. I gave pt direct line for financial assistance again bc the call disconnected last time pt tried.    I also gave [facility] social worker number to try and assist getting insurance

## 2021-06-11 ENCOUNTER — Telehealth (HOSPITAL_BASED_OUTPATIENT_CLINIC_OR_DEPARTMENT_OTHER): Payer: Self-pay | Admitting: Adult Congenital Heart Disease

## 2021-06-11 ENCOUNTER — Encounter (HOSPITAL_BASED_OUTPATIENT_CLINIC_OR_DEPARTMENT_OTHER): Payer: Self-pay

## 2021-06-11 NOTE — Telephone Encounter (Signed)
Tried to call pt to ask if has had a moment to apply on wahealthplanfinder    vm not set up - sending mychart msg

## 2021-06-12 NOTE — Progress Notes (Signed)
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      PROBLEM LIST     Patient Active Problem List    Diagnosis Date Noted   . CVA (cerebral vascular accident) (HCC) [I63.9] 09/10/2011   . Sprain of ankle- Right 06/2011 [S93.409A] 06/16/2011   . 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 1999.  2008 gen change.   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        . Low back pain [M54.50] 08/04/2014   . Weakness of trunk musculature [M62.81] 08/04/2014   . Hamstring tightness of both lower extremities [M62.9] 08/04/2014   . Midline low back pain without sciatica [M54.50] 08/04/2014   . 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     A) Status post repair with ventricular septal defect closure in February 1989.   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 Jude prosthetic valve in 1993.       . 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       . 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  TX FROM [place]     . Headache [R51.9]      TX FROM [place]     . Mitral valve replaced [Z95.2] 04/13/2013   . Chronic anticoagulation [Z79.01] 02/07/2013      ACC ENROLLED  11/11/18: discharged from service for noncompliance with f/u         HISTORY OF PRESENT ILLNESS   Primary care provider:  Dr. 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  He returns to the Adult Congenital  Heart Disease clinic having last seen Dr. Ria Comment in 2017 (with recommendation to return in a year).  A detailed overview of his baseline congenital anatomy, as well as subsequent procedures / interventions, is listed as above in the problem list.      At his 2017 visit (without echo needed), he felt well, maintaining ACC visits for routine INR checks (goal 2.5 - 3.5).  By exam he was euvolemic, vitals stable.  Plan moving forward from that visit - return in a year for visit and repeat echo, maintain SBE prophylaxis as well as EP follow up for his pacemaker.     Subsequent to this, his most recent cardiology visit (by chart review) was with EP cardiology in [facility]), 08/2020).  He had a-pacing with intact AV conduction.   [name] also received a referral to re-establish with our team earlier this year by primary care.      [name] reports today --   Schad feels great, "strong as a horse", he denies any cardiac symptoms, no changes to exertional tolerance.  He moved back to [facility] 01/2021 after separating from wife who is [place] who is primary parent to their 4yo son.  Relationship is strained, finds unable to work  right now due to emotional stress.  He's filing for SSI to get medical coverage, unsure if he is able to currently work.  [name] keeps busy helping drive his brother to work, care for his niece, good support from brother and sister.  From a cardiac perspective he denies any symptoms / changes to exertional tolerance.  He walks regularly for exercise, can climb 3-4 flights of stairs before needing to stop and rest.      Aware he should get INR checked regularly, last was 01/2021, he takes his warfarin 5mg  consistently qd, no missed doses.  If he had insurance, he'd go get his INR checked today.  However, without insurance, he states "I won't be able to get my labs drawn".  Admits he almost didn't come to appointments today, but sister convinced him this was important.      He hasn't had a  remote device check for his pacemaker in a long time, has had intermittent / rare access to home monitor.  Hasn't seen EP since 2021 visit (as above).  No pacemaker concerns as far as he's aware.      Nutrition: healthy overall, aware to avoid vit K foods.  Exercise / endurance: stable, see above.    ETOH:  None now since left [place].  Rare  Caffeine:  No energy drinks.  Caffeine:  4 per day  Dental: none recently.  States he has a "dead tooth" that needs extraction, aware to maintain SBE prophylaxis.     Cardiac ROS:    See above, otherwise --   Denies any other chest pain, SOB /dyspnea or PND, no recent significant palpitations, HA, pre/syncope, appetite and energy stable, denies any recent fevers, arthralgias, chills.  No LE edema.      MEDICATIONS  Outpatient Medications Marked as Taking for the 06/13/21 encounter (Office Visit) with CONGENITAL FELLOW 2   Medication Sig Dispense Refill   . Acetaminophen 500 MG Oral Tab Take 2 tablet by mouth every 8 hours if needed to relieve pain 60 Tab prn   . warfarin 5 MG tablet Take 1.5 tablets (7.5 mg) by mouth daily. 15 tablet 0         ALLERGIES  Review of patient's allergies indicates:  Allergies   Allergen Reactions   . Sulfa Antibiotics Skin: Rash        REVIEW OF SYSTEMS  A complete review of systems was performed.  Pertinent positives are included in the HPI. All remaining systems were reviewed and were negative.     PHYSICAL EXAMINATION     BP 118/71   Pulse 88   Temp 36.3 C (Temporal)   Ht 6\' 2"  (1.88 m)   Wt 81.2 kg (179 lb)   SpO2 99%   BMI 22.98 kg/m     General appearance:  alert, adult male in no acute distress  HEENT: Mucous membranes are moist and sclerae anicteric   NEURO: PERL, Symmetrically moves all extremities, facial expressions symmetrical, no slurred speech, answers questions appropriately  Thorax/Lungs:   Lungs clear to auscultation without rales or rhonchi. Normal effort of breathing  Heart:  Mechanical S1 with clean snap closure, normal S2 RRR,  no clicks, rubs or gallops, no other murmur.  Diastole is quiet, no elevated JVD on exam.    Abdomen: Soft nontender nondistended, no hepatomegaly  Musculoskeletal: Warm and well perfused. Radial pulses 2+ bilaterally  Skin and nails: Batman tattoos over R arm, otherwise -- No clubbing cyanosis or edema. No skin rashes.  DIAGNOSTIC TESTING       [place] EKG today:  V-paced, QRS 18m/s, QTc 54m/s  HR 133bpm at time of EKG.         [place] updated TTE today - preliminary reviewed.   Bradycardic in 40s during TTE, prompting pacemaker device interrogation.        Interpretation Summary    Conclusion  Normal left ventricular size and function.  EF 68%.      Normally functioning mechanical mitral prosthesis, with mean gradient 4 mmHg  at HR 45 BPM and no surrogate evidence of significant regurgitation.    Normal right ventricular size and function.    Moderate right sided AV valve regurgitation.    Normal estimated pulmonary artery and right atrial pressures.    The prior studies are unavailable for comparison.      Prior diagnostic testing (for reference) --    OSH TEE 2020 prompted by chest pain / SOB (TTE findings prior suggesting possible thrombus near LA):   Conclusions     Summary   Bileafletmechanical mitral valve in stable position and with normal   function.   The left atrium is mildly dilated.   No evidence for left atrial mass or thrombus.   There is grade I left ventricular diastolic dysfunction.   Pacer wires noted in the right heartchambers.     OSH TTE 10/2018:     Conclusions     Summary   There is a mechanical prosthetic mitral valve.   The mechanic prostheticmitral valve is not well visualized.   There is moderate mitral regurgitation.   The peak gradient is 16 mmHg. The mean gradient is 9 mmHg. Compared with   ECHO on 08-08-2017, MV gradient has increased from as of mean   gradient, and from as of peak gradient. HR is 62 bpm today.;     There is mild to moderate aortic  regurgitation.     There is moderate to severe tricuspid regurgitation.   Right ventricular systolic pressure is at 24 mmHg + right atrial pressure.     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   Recommend to have TEE to further study.       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  In interim, he's been followed by EP provider in [place], last visit almost a year ago, but no other [facility] specific visits.  Wilver is dealing with significant stressors, separated from his wife, unemployed, filing for SSI given his history of CVA and memory impairment.  From a cardiac perspective he otherwise feels well, no symptoms or changes to exertional tolerance.    Clinically:  Vitals stable in clinic, although bradycardic during echo in the 40s, his echo shows stable function of his mechanical mitral valve, normal biventricular systolic function.  He has moderate R AV valve regurgitation with normal RA pressures and PASP.  EKG demonstrating he's paced.  We were able to get [facility] connected with Dr. Luberta Robertson as well as device interrogation, he has an atrial lead that has failed.  EP team is on board and will guide care for pacemaker, moving forward (see separate visit note from today,  add-on).      Plan:  #  Referrals to [facility] / EP with charity care - discussed routine INR checks.  #  Device interrogation today -- atrial lead failure, reconnect with EP (likely correlates to brady on TTE).   #  No changes to medications, maintain strict SBE prophylaxis for all dental.  #  Filing for SSI / disability given history of CVA and neurocognitive / memory deficits, referral is in to rehab psych for updated neuro-psych  testing.   #  Goal INR 2.5-3.5.    #  Visit and echo with Valentina Lucks in a year.  Maintain PCP for preventive visits.    [facility] A/P class:  II-C    Collectively, Dr. Valentina Lucks and I spent a total of 45 minutes for the patient's care on the date of the service, over 50% spent in consultation.          Berenice Bouton, ARNP

## 2021-06-13 ENCOUNTER — Ambulatory Visit
Admission: RE | Admit: 2021-06-13 | Discharge: 2021-06-13 | Disposition: A | Discharge status: Home / Self Care | Payer: Self-pay | Attending: Cardiovascular Disease | Admitting: Cardiovascular Disease

## 2021-06-13 ENCOUNTER — Ambulatory Visit (HOSPITAL_BASED_OUTPATIENT_CLINIC_OR_DEPARTMENT_OTHER): Payer: Self-pay | Admitting: Clinical Cardiac Electrophysiology

## 2021-06-13 ENCOUNTER — Telehealth (HOSPITAL_BASED_OUTPATIENT_CLINIC_OR_DEPARTMENT_OTHER): Payer: Self-pay | Admitting: Pharmacist

## 2021-06-13 ENCOUNTER — Ambulatory Visit (HOSPITAL_BASED_OUTPATIENT_CLINIC_OR_DEPARTMENT_OTHER): Payer: Self-pay | Admitting: Cardiovascular Disease

## 2021-06-13 ENCOUNTER — Encounter (HOSPITAL_BASED_OUTPATIENT_CLINIC_OR_DEPARTMENT_OTHER): Payer: Self-pay

## 2021-06-13 ENCOUNTER — Other Ambulatory Visit (HOSPITAL_BASED_OUTPATIENT_CLINIC_OR_DEPARTMENT_OTHER): Payer: Self-pay | Admitting: Pharmacist

## 2021-06-13 VITALS — BP 118/71 | HR 88 | Temp 97.3°F | Ht 74.0 in | Wt 179.0 lb

## 2021-06-13 DIAGNOSIS — Z45018 Encounter for adjustment and management of other part of cardiac pacemaker: Secondary | ICD-10-CM

## 2021-06-13 DIAGNOSIS — Z952 Presence of prosthetic heart valve: Secondary | ICD-10-CM | POA: Insufficient documentation

## 2021-06-13 DIAGNOSIS — I639 Cerebral infarction, unspecified: Secondary | ICD-10-CM

## 2021-06-13 DIAGNOSIS — Z950 Presence of cardiac pacemaker: Secondary | ICD-10-CM

## 2021-06-13 DIAGNOSIS — I495 Sick sinus syndrome: Secondary | ICD-10-CM

## 2021-06-13 DIAGNOSIS — I4729 Other ventricular tachycardia: Secondary | ICD-10-CM

## 2021-06-13 DIAGNOSIS — I472 Ventricular tachycardia: Secondary | ICD-10-CM | POA: Insufficient documentation

## 2021-06-13 DIAGNOSIS — R9431 Abnormal electrocardiogram [ECG] [EKG]: Secondary | ICD-10-CM

## 2021-06-13 DIAGNOSIS — Z7901 Long term (current) use of anticoagulants: Secondary | ICD-10-CM

## 2021-06-13 DIAGNOSIS — Q212 Atrioventricular septal defect, unspecified as to partial or complete: Secondary | ICD-10-CM

## 2021-06-13 DIAGNOSIS — I219 Acute myocardial infarction, unspecified: Secondary | ICD-10-CM

## 2021-06-13 LAB — CONGENITAL TRANSTHORACIC ECHO (TTE) COMPLETE
Ascending aorta: 4 cm
EF: 67.7 %
IVSd: 0.84 cm
LV Systolic Volume (BP): 43.5 ml
LVIDd: 6.6 cm
LVIDs: 5.4 cm
LVPWd: 0.87 cm
RV Free wall pk S': 29.7 mmHg
RV Free wall pk S': 9.4 cm/s
TR Peak Vel: 248.1 cm/s

## 2021-06-13 LAB — EKG 12 LEAD
Atrial Rate: 133 {beats}/min
Q-T Interval: 212 ms
QRS Duration: 110 ms
QTC Calculation: 315 ms
R Axis: 83 degrees
T Axis: -36 degrees
Ventricular Rate: 133 {beats}/min

## 2021-06-13 NOTE — Patient Instructions (Addendum)
Good to see you Walter Rice, here's our plan --     Referrals are in for EP and Anticoag clinic (also recognize financial hardship / need for financial assistance).  We've recommended each of their teams also try to help with financial assistance as well.  Ideally you need INR's routinely checked.  Referral is in for rehab psych for updated neuro testing as part of filing for SSI.    No changes to medications for now.  Maintain antibiotics before seeing the dentist.   At the very least, return in a year plus repeat echo with Dr. Valentina Lucks.       Check out the Adult Congenital Heart Disease Association website, great resource:  www.achaheart.org      Never hesitate to contact the [facility] Team if you have any new onset cardiac symptoms / concerns.  eCare (if you are signed up for it) is useful for non-urgent concerns.  If you have any urgent concerns / questions -- best to call the [facility] RN desk # -- [phone].

## 2021-06-13 NOTE — Telephone Encounter (Signed)
Pt stopped by [facility] today to request INR orders, he is re-establishing care as he has moved back to [place] (had moved to [facility]). Last managed in 2020.    Will reactivate ACC referral. Of note, he does not have insurance currently and will follow-up with charity care/financial aid.    Dianna Limbo, PharmD

## 2021-06-13 NOTE — Progress Notes (Signed)
[facility] - New (patient has NOT been seen at [facility] within past 3 years)  Visit Date: 06/13/21     Primary Cardiologist: Nadene Rubins, MD  Referring Cardiologist/Electrophysiologist: Nadene Rubins, MD  Attending Electrophysiologist: Cammie Sickle, MD    Chief Complaint   This is a 35 year old male with the following electrophysiologic/cardiovascular problems:      #Sinus node dysfunction requiring dual chamber pacemaker  #S/p pacemaker  -initially abdominal generator, upgraded later in life; last generator replacement in 2017 by Alfonse Spruce, MD, here at [facility]  -since around that time has has an atrial lead alarm, but the atrial lead impedance has been stable in the mid 200s since that time, and atrial lead function has been normal  #AVSD  #Mitral valve replacement    Also:  -Hx of stroke off of warfarin    Patient Active Problem List   Diagnosis   . Sprain of ankle- Right 06/2011   . CVA (cerebral vascular accident) (HCC)   . Chronic anticoagulation   . Mitral valve replaced   . Atrioventricular septal defect   . Sinus node dysfunction (HCC)   . Nonsustained ventricular tachycardia (HCC)   . Headache   . Low back pain   . Weakness of trunk musculature   . Hamstring tightness of both lower extremities   . Midline low back pain without sciatica   . Pacemaker     HPI  06/13/21:    Mr. Walter Rice was seen by Dr. Valentina Lucks and was here to reestablish care, and so asked for an inperson visit to do that today.    He feels well overall. He moved back from [place]. He had moved there to be with his wife but unfortunately they separated and he moved back to [place] where his family lives. He intends to stay here and resume followup here. He had an echocardiogram today with Dr. Valentina Lucks and we are told it did not have any new concerns.    He was last seen in 2017 for generator change with Dr. Suella Broad. At that time his atrial lead alarm was noted but it was functioning normally and so was unchanged. He did not have any  lead revision at that time, and just had the generator change. He has a Surveyor, mining. We interrogated his device today, and it appears to be functioning well, stable impedance and capture threshhold for the last 5 years on the atrial lead. His leads are programmed to unipolar.    Denies dizziness, palpations, syncope, chest pain, and shortness of breath unless otherwise stated.    No outpatient medications have been marked as taking for the 06/13/21 encounter (Appointment) with Baltazar Najjar, MD.     Review of patient's allergies indicates:  Allergies   Allergen Reactions   . Sulfa Antibiotics Skin: Rash             Social History     Socioeconomic History   . Marital status: Legally Separated     Spouse name: Not on file   . Number of children: Not on file   . Years of education: Not on file   . Highest education level: Not on file   Occupational History   . Not on file   Tobacco Use   . Smoking status: Former Smoker     Packs/day: 0.25     Types: Cigarettes     Quit date: 05/07/2015     Years since quitting: 6.1   . Smokeless  tobacco: Current User   Substance and Sexual Activity   . Alcohol use: No   . Drug use: No   . Sexual activity: Not on file   Other Topics Concern   . Not on file   Social History Narrative   . Not on file     Social Determinants of Health     Financial Resource Strain: Not on file   Food Insecurity: Not on file   Transportation Needs: Not on file   Physical Activity: Not on file   Stress: Not on file   Social Connections: Not on file   Intimate Partner Violence: Not on file   Housing Stability: Not on file          Review of Systems:   All other systems were reviewed and are negative except as mentioned above        Physical Exam:  Vitals are wnl  Gen: NAD, pleasant  HEENT: PERRL, EOMI, anicteric sclera, moist mucus membranes  Respiratory: CTAB  Cardiovascular: rrr no m/r/g  Pacer c/d/I  No edema  GI: BS+, nt/nd  Musculoskeletal: Adequate muscle tone  Extremities: Warm, well  perfused  Skin: No visible rashes or jaundice  Lymph: No cervical LAD  Neuro: Aox3, maes  Psych: mood is great, affect is mood congruent    Data/Studies reviewed:       Results for orders placed or performed during the hospital encounter of 02/08/16   BASIC METABOLIC PANEL   Result Value Ref Range    Sodium 141 135 - 145 meq/L    Potassium 3.7 3.6 - 5.2 meq/L    Chloride 104 98 - 108 meq/L    Carbon Dioxide, Total 28 22 - 32 meq/L    Anion Gap 9 4 - 12    Glucose 94 62 - 125 mg/dL    Urea Nitrogen 15 8 - 21 mg/dL    Creatinine 1.06 2.69 - 1.18 mg/dL    Calcium 9.4 8.9 - 48.5 mg/dL    GFR, Calc, European American >60 mL/min/[1.73_m2]    GFR, Calc, African American >60 mL/min/[1.73_m2]    GFR, Information       Calculated GFR in mL/min/1.73 m2 by MDRD equation.  Inaccurate with changing renal function.  See [email or web address]     Results for orders placed or performed during the hospital encounter of 02/08/16   CBC (HEMOGRAM)   Result Value Ref Range    WBC 7.66 4.3 - 10.0 10*3/uL    RBC 4.24 (L) 4.40 - 5.60 10*6/uL    Hemoglobin 13.2 13.0 - 18.0 g/dL    Hematocrit 39 38 - 50 %    MCV 93 81 - 98 fL    MCH 31.1 27.3 - 33.6 pg    MCHC 33.6 32.2 - 36.5 g/dL    Platelet Count 462 703 - 400 10*3/uL    RDW-CV 13.1 11.6 - 14.4 %     Results for orders placed or performed in visit on 05/14/12   COMPREHENSIVE METABOLIC PANEL   Result Value Ref Range    Sodium 137 136 - 145 mEq/L    Potassium 3.5 (L) 3.7 - 5.2 mEq/L    Chloride 103 98 - 108 mEq/L    Carbon Dioxide, Total 26 22 - 32 mEq/L    Anion Gap 8 3 - 11    Glucose 91 62 - 125 mg/dL    Urea Nitrogen 10 8 - 21 mg/dL    Creatinine 5.00 9.38 - 1.18 mg/dL  Protein (Total) 6.9 6.0 - 8.2 g/dL    Albumin 4.4 3.5 - 5.2 g/dL    Bilirubin (Total) 1.0 0.2 - 1.3 mg/dL    Calcium 9.2 8.9 - 17.4 mg/dL    AST (GOT) 27 15 - 40 U/L    Alkaline Phosphatase (Total) 49 35 - 109 U/L    ALT (GPT) 19 10 - 64 U/L    GFR, Calc, European American >60 >59  mL/min    GFR, Calc, African American >60 >59 mL/min    GFR, Information       Calculated GFR in mL/min/1.73 m2 by MDRD equation.  Inaccurate with changing renal function.  See  [email or web address]        Results for orders placed during the hospital encounter of 06/13/21    CONGENITAL TRANSTHORACIC ECHO (TTE) COMPLETE    Narrative  Conclusion  Normal left ventricular size and function.  EF 68%.    Normally functioning mechanical mitral prosthesis, with mean gradient 4 mmHg  at HR 45 BPM and no surrogate evidence of significant regurgitation.    Normal right ventricular size and function.    Moderate right sided AV valve regurgitation.    Normal estimated pulmonary artery and right atrial pressures.    The prior studies are unavailable for comparison.    Signed by: Ernestina Columbia, MD on 06/13/2021 11:11 AM    Encounter Date: 06/13/21   EKG 12-Lead   Result Value    Ventricular Rate 133    Atrial Rate 133    QRS Duration 110    Q-T Interval 212    QTC Calculation 315    R Axis 83    T Axis -36    Diagnosis      SUPRAVENTRICULAR TACHYCARDIA WITH FREQUENT VENTRICULAR PACED BEATS AND PREMATURE SUPRAVENTRICULAR COMPLEXES IN A PATTERN OF BIGEMINY  SEPTAL MYOCARDIAL INFARCTION , AGE UNDETERMINED  LATERAL MYOCARDIAL INFARCTION , AGE UNDETERMINED  T WAVE ABNORMALITY, CONSIDER ANTERIOR ISCHEMIA  ABNORMAL ECG  WHEN COMPARED WITH ECG OF 21-Jul-2016 12:17,  ELECTRONIC VENTRICULAR PACEMAKER HAS REPLACED ELECTRONIC ATRIAL PACEMAKER  VENT. RATE HAS INCREASED BY  69 BPM       No results found for this or any previous visit.      Impression: This is a 35 year old male with:    #Sinus node dysfunction  #S/p transvenous pacemaker  #Congenital disease including AVSD  #Mechanical mitral valve replacement     Recommendations:    -before next generator change, it would be beneficial to have a 2v CXR or cross sectional imaging to identify anatomy of his current system. We did find one in Dr.  Sinclair Ship note from 07/2016.  -normal functioning dual chamber pacemaker system as far as atrial and ventricular pacing on ECG and interrogation today.  -continue usual followup in our clinic with q6mo interrogations and annual visit in person. We discussed the importance of this today, in order to continue safe monitoring of his lead parameters and function. We reassured patient regarding the alarm on his device in the context of all other parameters showing adequate function.     [x]  send f/u letter to Cardiologist/Referring/PCP?    Thank you for allowing to participate in care of this patient. Do not hesitate to call with any questions     Patient was seen and discussed with Dr. [place].     Cammie Sickle, MD, MS  Cardiac Electrophysiology Fellow

## 2021-06-19 ENCOUNTER — Telehealth (HOSPITAL_BASED_OUTPATIENT_CLINIC_OR_DEPARTMENT_OTHER): Payer: Self-pay | Admitting: Clinical Cardiac Electrophysiology

## 2021-06-19 ENCOUNTER — Encounter (HOSPITAL_BASED_OUTPATIENT_CLINIC_OR_DEPARTMENT_OTHER): Payer: Self-pay

## 2021-06-19 ENCOUNTER — Other Ambulatory Visit (HOSPITAL_BASED_OUTPATIENT_CLINIC_OR_DEPARTMENT_OTHER): Payer: Self-pay

## 2021-06-19 ENCOUNTER — Encounter (HOSPITAL_BASED_OUTPATIENT_CLINIC_OR_DEPARTMENT_OTHER): Payer: Self-pay | Admitting: Unknown Physician Specialty

## 2021-06-19 NOTE — Telephone Encounter (Signed)
Spoke with pt confirmed his contact info.     Called medtronic, ordered new carelink monitor.     Called pt back, relayed above and it should arrive within 7-10 business days.     Explained remote monitoring process to patient. That in general, we like to see our patients in person once yearly and our device clinic sets an automatic appointment for remote transmission about once every 3 months and bills their insurance once every 3 months. In the event they have concern for arrhythmia in between, OK to send extra remote transmission and our device clinic does not bill their insurance more than once every 3 months. Patient verbalized understanding, agreeable to transferring remotes to [facility]. Forwarding request to device clinic.     OF NOTE, IT APPEARS PT RECENTLY LEFT [place] URGENTLY DUE TO SAFETY CONCERNS RELATED TO HIS WIFE. ONLY SHARE INFORMATION WITH PATIENT.

## 2021-06-19 NOTE — Progress Notes (Signed)
I was asked by Dr. Valentina Lucks to review the Pacer.  The flow sheet for 06/13/2021 states Remote but was an in person encounter.   Stanford Breed RN

## 2021-06-19 NOTE — Telephone Encounter (Signed)
Called pt LVM that I'm calling to confirm his address before ordering new Medtronic monitor for him. Left my callback # [phone].

## 2021-06-24 ENCOUNTER — Encounter (HOSPITAL_BASED_OUTPATIENT_CLINIC_OR_DEPARTMENT_OTHER): Payer: Self-pay

## 2021-06-24 NOTE — Progress Notes (Signed)
Walter Rice is 3 days overdue for lab testing related to anticoagulant therapy. Left reminder message by telephone and mailed reminder letter today.

## 2021-06-24 NOTE — Telephone Encounter (Signed)
Patient just got his new MyCarelink monitor and is setting it up. Routing to device clinic to set up remote monitoring.

## 2021-06-25 NOTE — Telephone Encounter (Signed)
Requested patient transfer on Medtr[employer]- awaiting release from current clinic    Alice Rieger RN

## 2021-06-26 ENCOUNTER — Encounter (HOSPITAL_BASED_OUTPATIENT_CLINIC_OR_DEPARTMENT_OTHER): Payer: Self-pay

## 2021-06-26 NOTE — Progress Notes (Signed)
Walter Rice is 1 week overdue for lab testing related to anticoagulant therapy.  Left reminder message by telephone.

## 2021-07-03 ENCOUNTER — Encounter (HOSPITAL_BASED_OUTPATIENT_CLINIC_OR_DEPARTMENT_OTHER): Payer: Self-pay

## 2021-07-03 NOTE — Progress Notes (Signed)
Walter Rice is 2 weeks overdue for lab testing related to anticoagulant therapy.  Left reminder message by telephone.

## 2021-07-10 ENCOUNTER — Encounter (HOSPITAL_BASED_OUTPATIENT_CLINIC_OR_DEPARTMENT_OTHER): Payer: Self-pay

## 2021-07-10 NOTE — Progress Notes (Signed)
Walter Rice is 3 weeks overdue for lab testing related to anticoagulant therapy.  Left reminder message by telephone.

## 2021-07-19 ENCOUNTER — Encounter (HOSPITAL_BASED_OUTPATIENT_CLINIC_OR_DEPARTMENT_OTHER): Payer: Self-pay

## 2021-07-19 NOTE — Progress Notes (Signed)
Walter Rice is 30 days overdue for lab testing related to anticoagulant therapy.  Reminder letter mailed to patient today.

## 2021-07-23 ENCOUNTER — Encounter (INDEPENDENT_AMBULATORY_CARE_PROVIDER_SITE_OTHER): Payer: Self-pay

## 2021-08-03 ENCOUNTER — Other Ambulatory Visit: Payer: Self-pay

## 2021-08-03 ENCOUNTER — Other Ambulatory Visit (HOSPITAL_BASED_OUTPATIENT_CLINIC_OR_DEPARTMENT_OTHER): Payer: Self-pay | Admitting: Pharmacist

## 2021-08-03 DIAGNOSIS — Z7901 Long term (current) use of anticoagulants: Secondary | ICD-10-CM

## 2021-08-04 ENCOUNTER — Encounter (HOSPITAL_BASED_OUTPATIENT_CLINIC_OR_DEPARTMENT_OTHER): Payer: Self-pay | Admitting: Cardiovascular Disease

## 2021-08-04 NOTE — Telephone Encounter (Signed)
Defer to [name] - patient is reestablishing care with [facility]? Overdue for lab  No recent INR

## 2021-08-05 MED ORDER — WARFARIN SODIUM 5 MG OR TABS
7.5000 mg | ORAL_TABLET | Freq: Every day | ORAL | 0 refills | Status: DC
Start: 2021-08-05 — End: 2021-08-16

## 2021-08-05 NOTE — Telephone Encounter (Signed)
Preferred Pharmacy listed as the following:  [id number] [address]  [place] [phone]     MyChart message to patient to inquire if this is regarding Warfarin as only medications are Warfarin and Acetaminophen.  [facility] team does not prescribe Warfarin.

## 2021-08-07 ENCOUNTER — Encounter (INDEPENDENT_AMBULATORY_CARE_PROVIDER_SITE_OTHER): Payer: Self-pay

## 2021-08-07 ENCOUNTER — Encounter (INDEPENDENT_AMBULATORY_CARE_PROVIDER_SITE_OTHER): Payer: Self-pay | Admitting: Student in an Organized Health Care Education/Training Program

## 2021-08-07 NOTE — Progress Notes (Signed)
Walter Rice did not cancel and was not present for a scheduled appointment today.  Disposition: Defer to pt  To reschedule

## 2021-08-07 NOTE — Progress Notes (Signed)
Visit: Establish Care     Refills? NO  Referral? NO  Letter or Form? NO  Lab Results? NO    HEALTH MAINTENANCE:  Has the patient has this done since their last visit?  Cervical screening/PAP: N/A  Mammo: N/A  Colon Screen: N/A  Diabetic Eye Exam (If applicable): N/A    Have you seen a specialist since your last visit: No    Vaccines Due? Yes,     Does patient have eCare?  Yes     HM Due:   Health Maintenance   Topic Date Due   . Hepatitis B Vaccine (1 of 3 - 3-dose series) Never done   . COVID-19 Vaccine (1) Never done   . Depression Screening (PHQ-2)  Never done   . Influenza Vaccine (1) 07/06/2021   . DTaP, Tdap, and Td Vaccines (2 - Td or Tdap) 08/09/2023   . Lipid Disorders Screening  03/28/2024   . Hepatitis C Screening  Completed   . HIV Screening  Completed   . Hepatitis A Vaccine  Aged Out   . Pneumococcal Vaccine: Pediatrics (0-5 years) and At-Risk Patients (6-64 years)  Aged Out       PCP Verified?  Yes, Dorothe Pea, MD

## 2021-08-13 ENCOUNTER — Other Ambulatory Visit (HOSPITAL_BASED_OUTPATIENT_CLINIC_OR_DEPARTMENT_OTHER): Payer: Self-pay | Admitting: Pharmacist

## 2021-08-13 DIAGNOSIS — Z7901 Long term (current) use of anticoagulants: Secondary | ICD-10-CM

## 2021-08-14 ENCOUNTER — Ambulatory Visit (HOSPITAL_BASED_OUTPATIENT_CLINIC_OR_DEPARTMENT_OTHER): Payer: Self-pay | Admitting: Pharmacist

## 2021-08-14 NOTE — Telephone Encounter (Signed)
Defer to [name] - patient is reestablishing care with [facility]? Overdue for lab  No recent INR

## 2021-08-14 NOTE — Telephone Encounter (Signed)
Pt never re-established care with [facility]. He already received #15 tablets and was told he would need to be seen for further refills.

## 2021-08-14 NOTE — Progress Notes (Signed)
Walter Rice requires anticoagulation for mechanical MVR and hx of recurrent CVA with goal INR of 2.5-3.5.     He was re-referred to [facility] on 06/13/21 after moving back to [place]. Pt is now 6 weeks overdue for his first visit with [facility]. He is requesting a refill of warfarin.  A temporary supply of warfarin was called in for patient on 08/05/21 with instructions to follow up with [facility].     I explained to patient that we need to have an INR before we can safely prescribe warfarin. Pt reports he is trying to get approved for medicaid and was worried about medical costs.     I told him to contact the financial assistance department to see if he qualifies for temporary assistance.     He will come to [facility] tomorrow and stop by the financial assistance department. He will also get an INR done and [facility] fill follow up with patient afterwards. He has 7 tablets of warfarin remaining. I also provided the phone number for financial assistance to his sister who is helping him.

## 2021-08-15 ENCOUNTER — Ambulatory Visit: Payer: Self-pay | Attending: Cardiovascular Disease

## 2021-08-15 ENCOUNTER — Other Ambulatory Visit (HOSPITAL_BASED_OUTPATIENT_CLINIC_OR_DEPARTMENT_OTHER): Payer: Self-pay | Admitting: Pharmacist

## 2021-08-15 DIAGNOSIS — Z7901 Long term (current) use of anticoagulants: Secondary | ICD-10-CM | POA: Insufficient documentation

## 2021-08-15 LAB — PROTHROMBIN TIME
Prothrombin INR: 2.3 — ABNORMAL HIGH (ref 0.8–1.3)
Prothrombin Time Patient: 25.2 s — ABNORMAL HIGH (ref 10.7–15.6)

## 2021-08-16 ENCOUNTER — Ambulatory Visit: Payer: Self-pay | Attending: Nurse Practitioner | Admitting: Pharmacist

## 2021-08-16 DIAGNOSIS — Z7901 Long term (current) use of anticoagulants: Secondary | ICD-10-CM

## 2021-08-16 DIAGNOSIS — Z952 Presence of prosthetic heart valve: Secondary | ICD-10-CM

## 2021-08-16 MED ORDER — WARFARIN SODIUM 5 MG OR TABS
ORAL_TABLET | ORAL | 2 refills | Status: DC
Start: 2021-08-16 — End: 2021-09-02

## 2021-08-16 NOTE — Progress Notes (Signed)
ANTICOAGULATION TREATMENT PLAN    Indication: MVR; recurrent CVA  Goal INR: 2.5-3.5  Duration of Therapy: chronic    Hemorrhagic Risk Score: 1  Warfarin Tablet Size: 5mg     Relevant Historic Information: CVA 2008; 2005; 2006    Referring Provider: 04-05-1976    02/08/16: Pacemaker generator change INR goal ~ 2.5 with no heparin bridge.  3/18: stopped smoking ~12/13/16  Aug 2018: started smoking again  11/11/18:discharge from service due to noncompliance with f/u  06/13/21: re-establish care    INTERVAL HISTORY:    Walter Rice requires anticoagulation for mechanical MVR and hx of recurrent CVA with goal INR of 2.5-3.5.     He was re-referred to [name] on 06/13/21 after moving back to 08/13/21. Pt is now 6 weeks overdue for his first visit with [facility]. He is requesting a refill of warfarin.  A temporary supply of warfarin was called in for patient on 08/05/21 with instructions to follow up with [facility].     PHONE VISIT    An interpreter was not needed for the visit.    SUBJECTIVE:   Walter Rice is a 35 year old male on anticoagulation for MVR and history of recurrent CVA.      Patient presents to [facility] over the phone for initial visit. Patient HAS started warfarin prior to this visit.  Patient confirms taking warfarin 5mg  daily and missed one dose a couple of weeks ago.     S/sx of bleeding/bruising: No, Denies problems in this area  S/sx of CVA/TIA (HA, visual changes):  No, Denies problems in this area  Diet/Appetite: He averages 2 main meals a day and snacks. He tends to avoid dark green vegetables but will rarely have a little salad, spinach or asparagus.   EtOH: rarely consumes alcohol  Tobacco: Quit April 2022  Activity:  Low. He does not exercise.   Acute illness: denies illness  Upcoming procedures:  denies  Medication changes: He has not added any vitamins or supplements to his med list.     OBJECTIVE:   Present dose: Pt states he has been taking warfarin 5mg  daily  =  35mg /week    LABS:   Lab Results   Component Value Date    INR 2.3 (H) 08/15/2021    INR 1.2 08/02/2018    INR 4.5 07/09/2018    INR 3.6 05/28/2018    INR 3.5 04/28/2018    INR 1.9 04/16/2018        ASSESSMENT:   INR subtherapeutic  without any warfarin-related complications.  Pt would benefit from a mild increase in maintenance dose to aim for mid-therapeutic INR.      PLAN:   1. Increase warfarin 7.5mg  Fri and 5mg  all other days  = 37.5mg /week.  2. Recheck INR at [facility] 09/02/21.   3. Instructed patient to monitor for all s/sx of bleeding/bruising or thromboembolism       and seek medical evaluation if necessary.     EDUCATION  -Primary learner(s): Patient  -Challenges impacting this teaching session: None  -Education materials provided: None  -Post education response: states understanding  -Warfarin discussion topics included: Did not discuss.  Pt has a good understanding of warfarin.    Time spent 10 min  06/17/2018, PharmD    No billing for on-call follow up.

## 2021-08-19 ENCOUNTER — Other Ambulatory Visit (HOSPITAL_BASED_OUTPATIENT_CLINIC_OR_DEPARTMENT_OTHER): Payer: Self-pay

## 2021-09-02 ENCOUNTER — Other Ambulatory Visit (HOSPITAL_BASED_OUTPATIENT_CLINIC_OR_DEPARTMENT_OTHER): Payer: Self-pay | Admitting: Pharmacist

## 2021-09-02 ENCOUNTER — Ambulatory Visit: Payer: Self-pay | Attending: Pharmacist | Admitting: Pharmacist

## 2021-09-02 DIAGNOSIS — Z7901 Long term (current) use of anticoagulants: Secondary | ICD-10-CM | POA: Insufficient documentation

## 2021-09-02 DIAGNOSIS — Z952 Presence of prosthetic heart valve: Secondary | ICD-10-CM | POA: Insufficient documentation

## 2021-09-02 LAB — PROTHROMBIN TIME
Prothrombin INR: 2 — ABNORMAL HIGH (ref 0.8–1.3)
Prothrombin Time Patient: 22.7 s — ABNORMAL HIGH (ref 10.7–15.6)

## 2021-09-02 NOTE — Telephone Encounter (Signed)
ANTICOAGULATION TREATMENT PLAN    Indication: MVR; recurrent CVA  Goal INR: 2.5-3.5  Duration of Therapy: chronic    Hemorrhagic Risk Score: 1  Warfarin Tablet Size: 5mg     Relevant Historic Information: CVA 2008; 2005; 2006    Referring Provider: 04-05-1976    02/08/16: Pacemaker generator change INR goal ~ 2.5 with no heparin bridge.  3/18: stopped smoking ~12/13/16  Aug 2018: started smoking again  11/11/18:discharge from service due to noncompliance with f/u  06/13/21: re-establish care        This visit is being conducted over the telephone at the patient's request: Yes  Patient gives verbal consent to proceed and knows there may be a copay/deductible: Yes    Time spent with patient/guardian on this telephone visit: 6 minutes       SUBJECTIVE:   Walter Rice was last evaluated by [facility] on 08/16/21. His INR was 2.3 and he was instructed to increase to warfarin 7.5mg  Fri and 5mg  all other days.       Today, pt reports by phone:      S/sx of bleeding/bruising: No, Denies problems in this area  S/sx of CVA/TIA (HA, visual changes):  No, Denies problems in this area   Diet/Appetite:Pt averages 2 main meals a day plus snacks.  He does not include dark green leafy vegetables in his diet.  EtOH: He rarely has any alcohol.   Tobacco: denies use  Activity:  Unchanged over the past 2 weeks.   Acute illness: Pt did have a cough that lasted about one week.  He did not require any use of OTC meds.   Upcoming procedures:  denies  Medication changes: No herbal supplements, no MVI.       Present dose: Warfarin 7.5mg  Fri and 5mg  all other days. Dose increased from 35mg /wk on 08/16/21 in response to INR 2.3.      OBJECTIVE:     Relevant medication changes: None.  Med list updated/reviewed with patient today.      LABS:   Lab Results   Component Value Date    INR 2.0 09/02/2021    INR 2.3 08/15/2021    INR 1.2 08/02/2018    INR 4.5 07/09/2018    INR 3.6 05/28/2018    INR 3.5 04/28/2018       ASSESSMENT:   Walter Rice is anticoagulated with warfarin for MVR and history of recurrent CVA with an INR goal of 2.5-3.5.    INR below goal despite an increase in warfarin maintenance dose 2 weeks ago.  Pt would benefit from a mild increase in maintenance dose to aim for mid-therapeutic INR.      PLAN:   1. Increase to warfarin 7.5mg  MWF, 5mg  all other days (42.5mg /wk)  2. Return in 8 days (on 09/10/2021). INR @ [place].   3. Plan e-mailed to patient by MyChart.  Nic verbally expressed understanding of the above plan.        04/30/2018, PharmD , CACP

## 2021-09-10 ENCOUNTER — Ambulatory Visit: Payer: Self-pay | Attending: Pharmacist | Admitting: Pharmacist

## 2021-09-10 ENCOUNTER — Other Ambulatory Visit (HOSPITAL_BASED_OUTPATIENT_CLINIC_OR_DEPARTMENT_OTHER): Payer: Self-pay | Admitting: Pharmacist

## 2021-09-10 DIAGNOSIS — Z952 Presence of prosthetic heart valve: Secondary | ICD-10-CM | POA: Insufficient documentation

## 2021-09-10 DIAGNOSIS — Z7901 Long term (current) use of anticoagulants: Secondary | ICD-10-CM

## 2021-09-10 LAB — PROTHROMBIN TIME
Prothrombin INR: 3.1 — ABNORMAL HIGH (ref 0.8–1.3)
Prothrombin Time Patient: 31.3 s — ABNORMAL HIGH (ref 10.7–15.6)

## 2021-09-10 NOTE — Telephone Encounter (Signed)
ANTICOAGULATION TREATMENT PLAN    Indication: MVR; recurrent CVA  Goal INR: 2.5-3.5  Duration of Therapy: chronic    Hemorrhagic Risk Score: 1  Warfarin Tablet Size: 5mg     Relevant Historic Information: CVA 2008; 2005; 2006    Referring Provider: 04-05-1976    02/08/16: Pacemaker generator change INR goal ~ 2.5 with no heparin bridge.  3/18: stopped smoking ~12/13/16  Aug 2018: started smoking again  11/11/18:discharge from service due to noncompliance with f/u  06/13/21: re-establish care         SUBJECTIVE:   Walter Rice was last evaluated by [facility] on 09/02/21. His INR was 2.0 and he was instructed to increase to warfarin 7.5mg  MWF, 5mg  all other days.       Today, pt reports by phone he will establish with a new PCP later this month.      S/sx of bleeding/bruising: No, Denies problems in this area  S/sx of CVA/TIA (HA, visual changes):  No, Denies problems in this area   Diet/Appetite:Pt has 2 main meals a day + snacks.  He does not include green vegetables in the diet.   EtOH: rarely, pt has alcohol.   Tobacco: denies use  Activity:  Similar.  He does not work out.    Acute illness: denies illness  Upcoming procedures:  denies  Medication changes: denies      Present dose: warfarin 7.5mg  MWF, 5mg  all other days.  Dose increased from 40mg  /wk on 09/02/21 in response to INR of 2.0    OBJECTIVE:     Relevant medication changes: None.      LABS:   Lab Results   Component Value Date    INR 3.1 09/10/2021    INR 2.0 09/02/2021    INR 2.3 08/15/2021    INR 1.2 08/02/2018    INR 4.5 07/09/2018    INR 3.6 05/28/2018     ASSESSMENT:   Walter Rice is anticoagulated with warfarin for MVR and CVA with an INR goal of 2.5-3.5.    INR therapeutic in response to an increase in warfarin maintenance dose last week.  Appropriate to continue present warfarin maintenance dose for now in pt without any bleeding concerns or events.    Suggested pt consult with financial services regarding insurance coverage  eligibility.  He has SSI.  Pt states it is still appearing as if he is on his wife's insurance, which he is not.     PLAN:   1. Continue warfarin 7.5mg  MWF, 5mg  all other days (42.5mg /wk)  2. Return in 13 days (on 09/23/2021). INR @ [place].  3. Walter Rice verbally expressed understanding of the above plan.        09/08/2018, PharmD , CACP                This visit is being conducted over the telephone at the patient's request: Yes  Patient gives verbal consent to proceed and knows there may be a copay/deductible: Yes    Time spent with patient/guardian on this telephone visit: 7 minutes

## 2021-09-12 ENCOUNTER — Encounter (HOSPITAL_BASED_OUTPATIENT_CLINIC_OR_DEPARTMENT_OTHER): Payer: Self-pay | Admitting: Cardiology Med

## 2021-09-12 ENCOUNTER — Encounter (HOSPITAL_COMMUNITY): Payer: Self-pay

## 2021-09-24 ENCOUNTER — Telehealth (HOSPITAL_BASED_OUTPATIENT_CLINIC_OR_DEPARTMENT_OTHER): Payer: Self-pay

## 2021-09-26 ENCOUNTER — Encounter (INDEPENDENT_AMBULATORY_CARE_PROVIDER_SITE_OTHER): Payer: Self-pay | Admitting: Student in an Organized Health Care Education/Training Program

## 2021-09-26 NOTE — Progress Notes (Deleted)
Visit: New pt, est care form   Seeking labs and referrals   Wellness?     Refills? NO  Referral? NO  Letter or Form? NO  Lab Results? NO    HEALTH MAINTENANCE:  Has the patient has this done since their last visit?  Cervical screening/PAP: N/A  Mammo: NA  Colon Screen: N/A  Diabetic Eye Exam (If applicable): N/A    Have you seen a specialist since your last visit: No    Vaccines Due? Flu, covid     Does patient have eCare?  Yes    HM Due:   Health Maintenance   Topic Date Due   . Hepatitis B Vaccine (1 of 3 - 3-dose series) Never done   . COVID-19 Vaccine (1) Never done   . Depression Screening (PHQ-2)  Never done   . Influenza Vaccine (1) 07/06/2021   . DTaP, Tdap, and Td Vaccines (2 - Td or Tdap) 08/09/2023   . Lipid Disorders Screening  03/28/2024   . Hepatitis C Screening  Completed   . HIV Screening  Completed   . Hepatitis A Vaccine  Aged Out   . Pneumococcal Vaccine: Pediatrics (0-5 years) and At-Risk Patients (6-64 years)  Aged Out       PCP Verified?  Yes, No primary care provider on file.

## 2021-09-27 ENCOUNTER — Ambulatory Visit (INDEPENDENT_AMBULATORY_CARE_PROVIDER_SITE_OTHER): Payer: Self-pay

## 2021-10-02 ENCOUNTER — Telehealth (HOSPITAL_BASED_OUTPATIENT_CLINIC_OR_DEPARTMENT_OTHER): Payer: Self-pay | Admitting: Pharmacist

## 2021-10-02 ENCOUNTER — Ambulatory Visit (HOSPITAL_COMMUNITY): Payer: Self-pay

## 2021-10-02 ENCOUNTER — Other Ambulatory Visit (HOSPITAL_BASED_OUTPATIENT_CLINIC_OR_DEPARTMENT_OTHER): Payer: Self-pay | Admitting: Pharmacist

## 2021-10-02 ENCOUNTER — Ambulatory Visit: Payer: Self-pay | Attending: Clinical Cardiac Electrophysiology

## 2021-10-02 DIAGNOSIS — Z952 Presence of prosthetic heart valve: Secondary | ICD-10-CM

## 2021-10-02 DIAGNOSIS — I495 Sick sinus syndrome: Secondary | ICD-10-CM | POA: Insufficient documentation

## 2021-10-02 DIAGNOSIS — Z7901 Long term (current) use of anticoagulants: Secondary | ICD-10-CM | POA: Insufficient documentation

## 2021-10-02 DIAGNOSIS — I4729 Other ventricular tachycardia: Secondary | ICD-10-CM | POA: Insufficient documentation

## 2021-10-02 LAB — PROTHROMBIN TIME
Prothrombin INR: 3 — ABNORMAL HIGH (ref 0.8–1.3)
Prothrombin Time Patient: 30.7 s — ABNORMAL HIGH (ref 10.7–15.6)

## 2021-10-02 NOTE — Telephone Encounter (Signed)
ANTICOAGULATION TREATMENT PLAN  Indication: MVR; recurrent CVA  Goal INR: 2.5-3.5  Duration of Therapy: chronic    Hemorrhagic Risk Score: 1  Warfarin Tablet Size: 5mg     Relevant Historic Information: CVA 2008; 2005; 2006    Referring Provider: 04-05-1976    02/08/16: Pacemaker generator change INR goal ~ 2.5 with no heparin bridge.  3/18: stopped smoking ~12/13/16  Aug 2018: started smoking again  11/11/18:discharge from service due to noncompliance with f/u  06/13/21: re-establish care    No phone billing charged. No consent asked.     SUBJECTIVE:   Walter Rice was last evaluated by [facility] on 09/10/21. His INR was 3.1 and he was instructed to continue warfarin 7.5mg  MWF, 5mg  all other days (42.5mg /wk) with follow-up in 2 weeks (09/23/21). Patient is 9 days overdue due to snow.    Today, I spoke to Walter Rice by phone, and he reports:    S/sx of bleeding/bruising: none  S/sx of CVA/TIA (HA, visual changes, numbness/tingling, etc): none  Acute illness/changes in health: none  Diet/Appetite: no change- eating well and does not typically have greens in his diet  EtOH: no change- rarely drinks  Tobacco use: denies use  Activity level: no change since last ACC visit  Upcoming procedures: none    RELEVANT MEDICATION/OTC/SUPPLEMENT CHANGES: none    OBJECTIVE:   Last ACC visit: Instructed to continue warfarin 7.5mg  MWF, 5mg  all other days (42.5mg /wk)     Current  dose:   Patient TOOK instructed warfarin dose without errors     Lab Results   Component Value Date    INR 3.0 10/02/2021    INR 3.1 09/10/2021    INR 2.0 09/02/2021    INR 2.3 08/15/2021     ASSESSMENT:   Walter 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 INR is therapeutic without any warfarin-related complications. No changes in medications, diet/alcohol or lifestyle are reported today. Will recommend continuation of current dose.     PLAN:   1. Continue warfarin 7.5mg  MWF, 5mg  all other days  (42.5mg /week)  2. Return in 6 weeks (on 11/13/2021).  3. Instructed to monitor for all s/sx of bleeding/bruising or thromboembolism and seek medical evaluation if necessary.  4. 13/07/2021 verbally acknowledged understanding of this plan    [name], PharmD  10/02/21 11:52 AM

## 2021-10-11 ENCOUNTER — Other Ambulatory Visit: Payer: Self-pay

## 2021-10-11 ENCOUNTER — Ambulatory Visit: Payer: No Typology Code available for payment source

## 2021-10-11 NOTE — Progress Notes (Signed)
Routine informal interrogation of patient's pacemaker today showed noise on RA lead, and possible RV noise. Patient's underlying rhythm appears to be junctional rhythm 50s. With inhibition of pacing in clinic today, patient remained asymptomatic. Isometrics positive. He reports he's been well lately without any symptoms.     Dr. Luberta Robertson came and reviewed with patient. For now, our team will continue to monitor. His leads are from 1999. Discussed that the RA lead may fail in the coming months or years, as his RV lead may as well. Since he is not symptomatic, it is OK to CTM for now. Advised him to contact our team directly with any new symptoms.     Patient has concerns about getting charged, as he is working on getting insured. Our team will continue to try to accommodate his concerns about cost.        10/11/2021   CIED    Visit Type Clinic    Recall No    Pacing Mode AAIR=DDDR 60 - 180    Type PPM    Pacemaker Dependent No    Manufacturer Medtronic    Elective Replacement Indicator ERI    Generator Model Adapta L [id number]    Date of Generator Implant 02/08/2016    Generator Serial Number [id number]    Lead #1 Type Atrial    Model # [id number]    Lead #1 Serial # [id number]    Lead #1 Implant Date 11/17/1997    Lead #2 Type RV    Lead #2 Model # [id number]    Lead #2 Serial # [id number]    Lead #2 Implant Date 11/17/1997    Lead # 4 Serial #    Date of Service 10/11/2021    Battery Voltage 2.77V/6 yrs    P-Wave NR    R-Wave 8    Atrial impedence 274    RV impedence 868    Atrial Threshold 1.25 @ 0.4    RV Threshold 1.25 @ 0.4    Atrial Events    Ventricular Events binned at [facility]    % atrial pacing 99    % ventricular pacing 3    Underlying Rhythm Junctional rhythm 50s        Isometrics    EKG with underlying rhythm:      Hands in prayer position:      Hands together pulling apart:      Hands pushing off chair:      Plan:  -Continue to try to honor patient's request for no-cost charges  -See Dr. Luberta Robertson in 6 months  -Request no  charges from remote monitoring

## 2021-10-16 ENCOUNTER — Telehealth (HOSPITAL_BASED_OUTPATIENT_CLINIC_OR_DEPARTMENT_OTHER): Payer: Self-pay | Admitting: Clinical Cardiac Electrophysiology

## 2021-10-16 NOTE — Telephone Encounter (Signed)
Called pt LVM. Sent mychart msg:    "Hi Quanta,     Please call our scheduling team to make a routine appt with Dr. Luberta Robertson in about 6 months. Their number is [phone].    Below are some resources I think might be worth looking into:  -For assistance with exploring programs for hospital bill assistance, please call [phone].  -To apply for a variety of services such as Food, Cash, Child Care, Long-Term Care, and Medicare Savings Programs, please visit [email or web address]  -To apply for low-cost health insurance, please visit [email or web address]     Take care,     Concha Norway, RN  [facility]  Electrophysiology Clinic  Office Phone: [phone]"

## 2021-11-13 ENCOUNTER — Other Ambulatory Visit (HOSPITAL_BASED_OUTPATIENT_CLINIC_OR_DEPARTMENT_OTHER): Payer: Self-pay | Admitting: Pharmacist

## 2021-11-13 ENCOUNTER — Telehealth (HOSPITAL_BASED_OUTPATIENT_CLINIC_OR_DEPARTMENT_OTHER): Payer: No Typology Code available for payment source

## 2021-11-13 DIAGNOSIS — Z7901 Long term (current) use of anticoagulants: Secondary | ICD-10-CM

## 2021-11-14 ENCOUNTER — Other Ambulatory Visit (HOSPITAL_BASED_OUTPATIENT_CLINIC_OR_DEPARTMENT_OTHER): Payer: Self-pay | Admitting: Pharmacist

## 2021-11-14 MED ORDER — WARFARIN SODIUM 5 MG OR TABS
ORAL_TABLET | ORAL | 2 refills | Status: DC
Start: 2021-11-14 — End: 2022-09-22

## 2021-11-20 ENCOUNTER — Telehealth (HOSPITAL_BASED_OUTPATIENT_CLINIC_OR_DEPARTMENT_OTHER): Payer: No Typology Code available for payment source

## 2021-11-29 ENCOUNTER — Ambulatory Visit: Payer: No Typology Code available for payment source | Attending: Cardiovascular Disease | Admitting: Pharmacist

## 2021-11-29 ENCOUNTER — Other Ambulatory Visit (HOSPITAL_BASED_OUTPATIENT_CLINIC_OR_DEPARTMENT_OTHER): Payer: Self-pay | Admitting: Pharmacist

## 2021-11-29 DIAGNOSIS — Z7901 Long term (current) use of anticoagulants: Secondary | ICD-10-CM | POA: Insufficient documentation

## 2021-11-29 DIAGNOSIS — Z952 Presence of prosthetic heart valve: Secondary | ICD-10-CM | POA: Insufficient documentation

## 2021-11-29 LAB — PROTHROMBIN TIME
Prothrombin INR: 3.5 — ABNORMAL HIGH (ref 0.8–1.3)
Prothrombin Time Patient: 35.8 s — ABNORMAL HIGH (ref 10.7–15.6)

## 2021-11-29 NOTE — Telephone Encounter (Signed)
ANTICOAGULATION TREATMENT PLAN    Indication: MVR; recurrent CVA  Goal INR: 2.5-3.5  Duration of Therapy: chronic    Hemorrhagic Risk Score: 1  Warfarin Tablet Size: 5mg     Relevant Historic Information: CVA 2008; 2005; 2006    Referring Provider: Aggie Cosier    02/08/16: Pacemaker generator change INR goal ~ 2.5 with no heparin bridge.  3/18: stopped smoking ~12/13/16  Aug 2018: started smoking again  11/11/18:discharge from service due to noncompliance with f/u  06/13/21: re-establish care    SUBJECTIVE:   Walter Rice was last evaluated by [facility] on 10/02/2021. His INR was 3.0 and he was instructed to continue warfarin 7.5mg  MWF, and 5mg  all other days (42.5mg /wk) with followup in 6 weeks (on 11/13/2021). Patient missed follow-up telephone appointment on 11/20/2021 due to stomach bug, which has since resolved.     Today, I spoke to Walter Rice and he reports the following    S/sx of bleeding/bruising: denies  S/sx of CVA/TIA (HA, visual changes, numbness/tingling, etc): denies  S/sx of thromboembolism (chest pain, shortness of breath, extremity pain, redness, or swelling, etc): denies  Acute illness/changes in health: Patient had a stomach bug on 11/20/2021 that has since resolved.   Diet/Appetite: No changes- patient still maintaining diet of 2 main meals with snacks daily.  EtOH: No change- rarely drinks; last time patient had a drink was during the Super Bowl.  Tobacco/marijuana/CBD use: denies use  Activity level: No changes- Patient consistent with walking dog every day.   Upcoming procedures: Patient states he will schedule dental appointment soon to "address everything," and will call to let us know if he will need any procedures done (e.g. tooth extractions).    RELEVANT MEDICATION/OTC/SUPPLEMENT CHANGES: none    OBJECTIVE:     Current  dose:   Warfarin 7.5mg  MWF, and 5mg  all other days (42.5mg /wk). No errors reported.    LABS:   Lab Results   Component Value Date    INR 3.5 11/29/2021    INR 3.0  10/02/2021    INR 3.1 09/10/2021    INR 2.0 09/02/2021    INR 2.3 08/15/2021      ASSESSMENT:   Walter Rice requires anticoagulation for MVR; recurrent CVA with INR goal of 2.5-3.5. Today his INR is therapeutic without any warfarin related complications. Patient reports no clinically significant changes in medication, diet, or lifestyle today. Given INR is trending up, will recommend slight warfarin dose decrease with close follow up.     PLAN:   1. DECREASE warfarin dose to 7.5mg  Mon/Fri and 5mg  all other days (40mg /wk)  2. Return in 2 weeks (on 12/13/2021).   3. Walter Rice acknowledged understanding of this plan    [place] Journey, Student

## 2021-11-29 NOTE — Telephone Encounter (Signed)
I discussed this patient with the student.  I have reviewed and confirm the findings, the assessment and the plan as documented in the student's note.     Bain Whichard W Crescencio Jozwiak, PharmD

## 2021-11-30 ENCOUNTER — Telehealth (INDEPENDENT_AMBULATORY_CARE_PROVIDER_SITE_OTHER): Payer: Self-pay | Admitting: Student in an Organized Health Care Education/Training Program

## 2021-11-30 NOTE — Telephone Encounter (Signed)
Spoke to patient, rescheduled, closing

## 2021-11-30 NOTE — Telephone Encounter (Signed)
Walter Rice is no longer working mondays    Patients 7/3 appointment needs to be rescheduled. Okay to overriding session limits

## 2021-12-13 ENCOUNTER — Ambulatory Visit: Payer: No Typology Code available for payment source | Attending: Cardiovascular Disease | Admitting: Pharmacist

## 2021-12-13 ENCOUNTER — Other Ambulatory Visit (HOSPITAL_BASED_OUTPATIENT_CLINIC_OR_DEPARTMENT_OTHER): Payer: Self-pay | Admitting: Pharmacist

## 2021-12-13 DIAGNOSIS — Z7901 Long term (current) use of anticoagulants: Secondary | ICD-10-CM | POA: Insufficient documentation

## 2021-12-13 DIAGNOSIS — Z5181 Encounter for therapeutic drug level monitoring: Secondary | ICD-10-CM

## 2021-12-13 DIAGNOSIS — Z952 Presence of prosthetic heart valve: Secondary | ICD-10-CM | POA: Insufficient documentation

## 2021-12-13 LAB — PROTHROMBIN TIME
Prothrombin INR: 2.6 — ABNORMAL HIGH (ref 0.8–1.3)
Prothrombin Time Patient: 27.8 s — ABNORMAL HIGH (ref 10.7–15.6)

## 2021-12-13 LAB — CARDIAC DEVICE CHECK - REMOTE
AT Burden Percent: 0.3
Atrial Pacing Percent: 97
Battery Remaining Longevity: 75
Battery Voltage: 2.77
RA Impedance: 280
RA Threshold Amplitude: 1.125
RV Impedance: 447
RV Pacing Percent: 9
RV Threshold Amplitude: 0.75

## 2021-12-13 NOTE — Telephone Encounter (Signed)
ANTICOAGULATION TREATMENT PLAN    Indication: MVR; recurrent CVA  Goal INR: 2.5-3.5  Duration of Therapy: chronic    Hemorrhagic Risk Score: 1  Warfarin Tablet Size: 5mg     Relevant Historic Information: CVA 2008; 2005; 2006    Referring Provider: 04-05-1976    02/08/16: Pacemaker generator change INR goal ~ 2.5 with no heparin bridge.  3/18: stopped smoking ~12/13/16  Aug 2018: started smoking again  11/11/18:discharge from service due to noncompliance with f/u  06/13/21: re-establish care      This visit is being conducted over the telephone at the patient's request: Yes  Patient gives verbal consent to proceed and knows there may be a copay/deductible: Yes    Time spent with patient/guardian on this telephone visit: 10 minutes    Given the importance of social distancing and other strategies recommended to reduce the risk of COVID-19 transmission, I am providing medical care to this patient via a telephone visit in place of an in person visit at the request of the patient.    SUBJECTIVE:    Walter Rice was last evaluated by [facility] on 11/29/21, with INR = 3.5, and instructed to decrease from 42.5mg /wk to 7.5mg  MF, 5mg  other days (40mg /wk), and follow up in 2 weeks.    Visit type: RETURN Phone Visit  An interpreter was not needed for the visit.  Conducted the visit with the patient, Walter Rice.    Missed doses: no missed/extra warfarin doses or other errors    Hemorrhagic Sx: no issues/complaints  CVA, TIA, headache: no issues/complaints  Thrombotic Sx: N/A  Acute illness/changes in health: none  Diet/Appetite: eating greens 1-2 time(s) per WEEK  Alcohol use: rare  Tobacco use: does not use tobacco products  Activity level: unchanged since last ACC visit  Upcoming procedures: none  Other: N/A  Relevant med changes (RX, OTC, supplements): none    OBJECTIVE / LABS:  Lab Results   Component Value Date    INR 2.6 12/13/2021    INR 3.5 11/29/2021    INR 3.0 10/02/2021    INR 3.1 09/10/2021     INR 2.0 09/02/2021    INR 2.3 08/15/2021       ASSESSMENT:  INR therapeutic on current warfarin dose, and now at the lower end of his range. No change in dose at this time. On prior dosing of 42.5mg /wk his INR increased up to 3.5. Will see how his INR settles on this dose.    PLAN:  1. Continue warfarin 7.5mg  MF, 5mg  other days (40mg /wk).   2. Return in 3 weeks (on 01/03/2022).  3. Patient expressed understanding of this plan.        09/04/2021, PharmD

## 2021-12-17 ENCOUNTER — Encounter (HOSPITAL_BASED_OUTPATIENT_CLINIC_OR_DEPARTMENT_OTHER): Payer: Self-pay | Admitting: Clinical Cardiac Electrophysiology

## 2021-12-17 NOTE — Progress Notes (Signed)
Alert on Medtronic pacemake on 12/13/21 for AHR and VHR episodes     Presenting is ApVs @ 58 with FFRW noted on atrial channel .Appropriate RA and RV capture in DOO mode    There are 158 VHR episodes, lasting up to 49 seconds. 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     There are 1298 MS episodes, < 1 minute, and 12 AHR episodes, EGMs consistent with likely atrial oversensing of noise. Lead warning issued at in office check in 1/23. Impedence stable.     Per plan from OV in 1/23 -RV and RA lead noise noted, leads from 1999, isometrics positive, CTM.     Report in MM    Will update team     Alice Rieger RN

## 2022-01-01 ENCOUNTER — Encounter (HOSPITAL_BASED_OUTPATIENT_CLINIC_OR_DEPARTMENT_OTHER): Payer: Self-pay

## 2022-01-01 ENCOUNTER — Encounter (HOSPITAL_BASED_OUTPATIENT_CLINIC_OR_DEPARTMENT_OTHER): Payer: No Typology Code available for payment source

## 2022-01-01 ENCOUNTER — Ambulatory Visit: Payer: No Typology Code available for payment source | Attending: Cardiac Electrophysiology

## 2022-01-01 ENCOUNTER — Ambulatory Visit (HOSPITAL_BASED_OUTPATIENT_CLINIC_OR_DEPARTMENT_OTHER): Payer: No Typology Code available for payment source

## 2022-01-01 DIAGNOSIS — I495 Sick sinus syndrome: Secondary | ICD-10-CM

## 2022-01-01 DIAGNOSIS — Z950 Presence of cardiac pacemaker: Secondary | ICD-10-CM | POA: Insufficient documentation

## 2022-01-03 ENCOUNTER — Other Ambulatory Visit (HOSPITAL_BASED_OUTPATIENT_CLINIC_OR_DEPARTMENT_OTHER): Payer: Self-pay | Admitting: Pharmacist

## 2022-01-03 ENCOUNTER — Ambulatory Visit: Payer: No Typology Code available for payment source | Attending: Pharmacist | Admitting: Pharmacist

## 2022-01-03 DIAGNOSIS — Z7901 Long term (current) use of anticoagulants: Secondary | ICD-10-CM

## 2022-01-03 DIAGNOSIS — Z952 Presence of prosthetic heart valve: Secondary | ICD-10-CM | POA: Insufficient documentation

## 2022-01-03 DIAGNOSIS — Z8673 Personal history of transient ischemic attack (TIA), and cerebral infarction without residual deficits: Secondary | ICD-10-CM

## 2022-01-03 DIAGNOSIS — I059 Rheumatic mitral valve disease, unspecified: Secondary | ICD-10-CM | POA: Insufficient documentation

## 2022-01-03 DIAGNOSIS — Z5181 Encounter for therapeutic drug level monitoring: Secondary | ICD-10-CM | POA: Insufficient documentation

## 2022-01-03 LAB — PROTHROMBIN TIME
Prothrombin INR: 2.5 — ABNORMAL HIGH (ref 0.8–1.3)
Prothrombin Time Patient: 27.3 s — ABNORMAL HIGH (ref 10.7–15.6)

## 2022-01-03 NOTE — Telephone Encounter (Signed)
ANTICOAGULATION TREATMENT PLAN    Indication: MVR; recurrent CVA  Goal INR: 2.5-3.5  Duration of Therapy: chronic    Hemorrhagic Risk Score: 1  Warfarin Tablet Size: 5mg     Relevant Historic Information: CVA 2008; 2005; 2006    Referring Provider: Nadene Rubins    02/08/16: Pacemaker generator change INR goal ~ 2.5 with no heparin bridge.  3/18: stopped smoking ~12/13/16  Aug 2018: started smoking again  11/11/18:discharge from service due to noncompliance with f/u  06/13/21: re-establish care    SUBJECTIVE:   Walter Rice was last evaluated by [facility] on 12/13/21 regarding his therapeutic INR.     Pt was advised to continue on current dose of warfarin .  He was rescheduled for next INR in 3 weeks.    I spoke to pt about his INR result from today.      Signs of bleeding/bruising: none reported  Signs/sxs of stroke/TIA: none reported  Acute changes in health: none reported  Diet: consistent with 1-2 serving of greens per week.  Appetite: no changes  Activity level: no changes reported   Alcohol use: consumes rarely  Tobacco use: denies use   Other: none      Upcoming procedures: none        RELEVANT MEDICATION/OTC/SUPPLEMENT CHANGES: none     OBJECTIVE:   Present warfarin dose: Warfarin dose 7.5 mg on Mon/Fri and 5 mg on all other days (40mg /week).  No dosing error (denies missed or extra doses)       LABS:   Lab Results   Component Value Date    INR 2.5 01/03/2022    INR 2.6 12/13/2021    INR 3.5 11/29/2021    INR 3.0 10/02/2021    INR 4.5 01/22/2012            ASSESSMENT:   INR remains on lower end of therapeutic range on current dose of warfarin.  Of note, pt's INR was taking 42.5mg /week warfarin dose prior to dose reduction on 11/29/21 to avoid supratherapeutic INR.  Recommended pt to increase dose back to 42.5mg /week but pt politely denied.  He agreed to take a booster dose today.     PLAN:   1.  Warfarin 10 mg  instead of 7.5mg  today, on 01/03/2022, and then resume usual dose of 7.5 mg on MF and 5 mg on  all other days (40mg /week)   2.  Return in 3 weeks (on 01/24/2022).  3.  Pt verbalized understanding and agreed to above plan.    This visit is being conducted over the telephone at the patient's request: Yes  Patient gives verbal consent to proceed and knows there may be a copay/deductible: Yes    Time spent with patient/guardian on this telephone visit: 5 minutes      Royston Bake, PharmD MS CGP

## 2022-01-07 ENCOUNTER — Ambulatory Visit (HOSPITAL_BASED_OUTPATIENT_CLINIC_OR_DEPARTMENT_OTHER): Payer: No Typology Code available for payment source

## 2022-01-24 ENCOUNTER — Other Ambulatory Visit (HOSPITAL_BASED_OUTPATIENT_CLINIC_OR_DEPARTMENT_OTHER): Payer: Self-pay | Admitting: Pharmacist

## 2022-01-24 ENCOUNTER — Ambulatory Visit: Payer: No Typology Code available for payment source | Attending: Pharmacist | Admitting: Pharmacist

## 2022-01-24 DIAGNOSIS — Z7901 Long term (current) use of anticoagulants: Secondary | ICD-10-CM

## 2022-01-24 DIAGNOSIS — Z8673 Personal history of transient ischemic attack (TIA), and cerebral infarction without residual deficits: Secondary | ICD-10-CM | POA: Insufficient documentation

## 2022-01-24 DIAGNOSIS — Z952 Presence of prosthetic heart valve: Secondary | ICD-10-CM | POA: Insufficient documentation

## 2022-01-24 DIAGNOSIS — Z5181 Encounter for therapeutic drug level monitoring: Secondary | ICD-10-CM | POA: Insufficient documentation

## 2022-01-24 LAB — PROTHROMBIN TIME
Prothrombin INR: 2.8 — ABNORMAL HIGH (ref 0.8–1.3)
Prothrombin Time Patient: 30.1 s — ABNORMAL HIGH (ref 10.7–15.6)

## 2022-01-24 NOTE — Telephone Encounter (Signed)
ANTICOAGULATION TREATMENT PLAN    Indication: MVR; recurrent CVA  Goal INR: 2.5-3.5  Duration of Therapy: chronic    Hemorrhagic Risk Score: 1  Warfarin Tablet Size: 5mg     Relevant Historic Information: CVA 2008; 2005; 2006    Referring Provider: Aggie Cosier    02/08/16: Pacemaker generator change INR goal ~ 2.5 with no heparin bridge.  3/18: stopped smoking ~12/13/16  Aug 2018: started smoking again  11/11/18:discharge from service due to noncompliance with f/u  06/13/21: re-establish care      This visit is being conducted over the telephone at the patient's request: Yes  Patient gives verbal consent to proceed and knows there may be a copay/deductible: Yes    Time spent with patient/guardian on this telephone visit: 10 minutes    Given the importance of social distancing and other strategies recommended to reduce the risk of COVID-19 transmission, I am providing medical care to this patient via a telephone visit in place of an in person visit at the request of the patient.    SUBJECTIVE:    Walter Rice was last evaluated by [facility] on 01/03/22, with INR = 2.5, and instructed to take warfarin 10mg  x1 dose, then resume 7.5mg  MF, 5mg  other days (40mg /wk), and follow up in 3 weeks.    Visit type: RETURN Phone Visit  An interpreter was not needed for the visit.  Conducted the visit with the patient, Walter Rice.    Missed doses: no missed/extra warfarin doses or other errors    Hemorrhagic Sx: no issues/complaints  CVA, TIA, headache: no issues/complaints  Thrombotic Sx: N/A  Acute illness/changes in health: none  Diet/Appetite: eating greens 1-2 time(s) per WEEK  Alcohol use: rare use  Tobacco use: does not use tobacco products  Activity level: unchanged since last ACC visit  Upcoming procedures: none  Other: N/A  Relevant med changes (RX, OTC, supplements): none    OBJECTIVE / LABS:  Lab Results   Component Value Date    INR 2.8 01/24/2022    INR 2.5 01/03/2022    INR 2.6 12/13/2021    INR  3.5 11/29/2021    INR 3.0 10/02/2021    INR 3.1 09/10/2021       ASSESSMENT:  INR therapeutic on current warfarin dose.    PLAN:  1. Continue warfarin 7.5mg  MF, 5mg  other days (40mg /wk).  2. Return in 4 weeks (on 02/21/2022).  3. Patient expressed understanding of this plan.        Truddie Crumble, PharmD

## 2022-02-21 ENCOUNTER — Ambulatory Visit (HOSPITAL_BASED_OUTPATIENT_CLINIC_OR_DEPARTMENT_OTHER): Payer: Medicare Other

## 2022-02-28 ENCOUNTER — Encounter (HOSPITAL_BASED_OUTPATIENT_CLINIC_OR_DEPARTMENT_OTHER): Payer: Self-pay

## 2022-02-28 NOTE — Progress Notes (Signed)
Walter Rice is 1 week overdue for lab testing related to anticoagulant therapy. Spoke with [name] on 5/24, who stated he planned on getting labs on Friday, 5/26. No results available; a reminder letter was sent to the patient today.

## 2022-03-07 ENCOUNTER — Ambulatory Visit: Payer: Medicare Other | Attending: Cardiovascular Disease | Admitting: Pharmacist

## 2022-03-07 ENCOUNTER — Other Ambulatory Visit (HOSPITAL_BASED_OUTPATIENT_CLINIC_OR_DEPARTMENT_OTHER): Payer: Self-pay | Admitting: Pharmacist

## 2022-03-07 DIAGNOSIS — Z5181 Encounter for therapeutic drug level monitoring: Secondary | ICD-10-CM | POA: Insufficient documentation

## 2022-03-07 DIAGNOSIS — Z7901 Long term (current) use of anticoagulants: Secondary | ICD-10-CM | POA: Insufficient documentation

## 2022-03-07 DIAGNOSIS — Z8673 Personal history of transient ischemic attack (TIA), and cerebral infarction without residual deficits: Secondary | ICD-10-CM | POA: Insufficient documentation

## 2022-03-07 DIAGNOSIS — Z952 Presence of prosthetic heart valve: Secondary | ICD-10-CM | POA: Insufficient documentation

## 2022-03-07 LAB — PROTHROMBIN TIME
Prothrombin INR: 2.4 — ABNORMAL HIGH (ref 0.8–1.3)
Prothrombin Time Patient: 26.3 s — ABNORMAL HIGH (ref 10.7–15.6)

## 2022-03-07 NOTE — Telephone Encounter (Signed)
ANTICOAGULATION TREATMENT PLAN     Indication: MVR; recurrent CVA  Goal INR: 2.5-3.5  Duration of Therapy: chronic     Hemorrhagic Risk Score: 1  Warfarin Tablet Size: 5mg      Relevant Historic Information: CVA 2008; 2005; 2006     Referring Provider: Nadene Rubins     02/08/16: Pacemaker generator change INR goal ~ 2.5 with no heparin bridge.  3/18: stopped smoking ~12/13/16  Aug 2018: started smoking again  11/11/18:discharge from service due to noncompliance with f/u  06/13/21: re-establish care        SUBJECTIVE:   Walter Rice was last evaluated by [facility] on 01/24/22.   His INR was 2.8 (goal 2.5-3.5) and he was instructed to continue his current warfarin dose  and follow up with ACC in 4 weeks (02/21/22). He was unable to check his INR on this date and checked on 03/07/22.     I spoke with Walter Rice by phone for follow up.     S/sx of bruising/bleeding (including melena/hematuria) Pt reports a minor amount of BRBPR last weekend. He had one episode on Friday and one on Saturday. He denies hemorrhoids or constipation. He denies any other concerns with bruising or bleeding  S/sx of CVA/TIA (HA, visual changes, numbness/tingling, etc): denies   S/sx of thromboembolism (chest pain, shortness of breath, extremity pain, redness, or swelling, etc): N/A  Acute illness/changes in health: none  Diet: Walter Rice usually eats a salad 1-2 times per week. However, he ate a serving of kale last week which is unusual for him as he usually avoids the dark greens.   Alcohol:  denies use   Marijuana/Tobacco: denies use   Activitity: No change since last visit.     Procedures: none    RELEVANT MEDICATION/OTC/SUPPLEMENT CHANGES: none     OBJECTIVE:     Current  dose:   Warfarin 7.5mg  MF, 5mg  on all other days  (40mg /wk). No errors reported.      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    INR just slightly below goal likely due to a transient increase in vitamin k consumption. Pt with 2 minor episodes of BRBPR last weekend. Advised pt to follow up with his PCP for evaluation if he has another episode given no clear etiology for rectal bleeding.     PLAN:   Continue warfarin 7.5mg  MF, 5mg  on all other days (40mg /wk). Pt reluctant to take a loading dose of warfarin.   Return in 4 weeks (on 04/04/2022).   Walter Rice acknowledged understanding of this plan    Walter Rice, PharmD    This visit is being conducted over the telephone at the patient's request: Yes  Patient gives verbal consent to proceed and knows there may be a copay/deductible: Yes    Time spent with patient/guardian on this telephone visit: 6 minutes

## 2022-03-19 ENCOUNTER — Other Ambulatory Visit (HOSPITAL_BASED_OUTPATIENT_CLINIC_OR_DEPARTMENT_OTHER): Payer: Self-pay | Admitting: Cardiovascular Disease

## 2022-03-19 LAB — CARDIAC DEVICE CHECK - REMOTE
AT Burden Percent: 0.4
Atrial Pacing Percent: 97
Battery Remaining Longevity: 76
Battery Voltage: 2.77
RA Impedance: 274
RA Threshold Amplitude: 1
RV Impedance: 771
RV Pacing Percent: 7
RV Threshold Amplitude: 0.75

## 2022-04-02 ENCOUNTER — Ambulatory Visit: Payer: Medicare Other | Attending: Cardiovascular Disease

## 2022-04-02 ENCOUNTER — Ambulatory Visit (HOSPITAL_BASED_OUTPATIENT_CLINIC_OR_DEPARTMENT_OTHER): Payer: Medicare Other

## 2022-04-02 DIAGNOSIS — Z950 Presence of cardiac pacemaker: Secondary | ICD-10-CM | POA: Insufficient documentation

## 2022-04-02 DIAGNOSIS — I495 Sick sinus syndrome: Secondary | ICD-10-CM

## 2022-04-04 ENCOUNTER — Ambulatory Visit (HOSPITAL_BASED_OUTPATIENT_CLINIC_OR_DEPARTMENT_OTHER): Payer: Medicare Other

## 2022-04-07 ENCOUNTER — Ambulatory Visit (INDEPENDENT_AMBULATORY_CARE_PROVIDER_SITE_OTHER): Payer: Medicare Other | Admitting: Student in an Organized Health Care Education/Training Program

## 2022-04-09 ENCOUNTER — Ambulatory Visit (INDEPENDENT_AMBULATORY_CARE_PROVIDER_SITE_OTHER): Payer: Medicare Other | Admitting: Student in an Organized Health Care Education/Training Program

## 2022-04-10 ENCOUNTER — Encounter (HOSPITAL_BASED_OUTPATIENT_CLINIC_OR_DEPARTMENT_OTHER): Payer: Self-pay | Admitting: Clinical Cardiac Electrophysiology

## 2022-04-18 ENCOUNTER — Ambulatory Visit: Payer: Medicare Other | Attending: Cardiovascular Disease | Admitting: Pharmacist

## 2022-04-18 ENCOUNTER — Encounter (HOSPITAL_BASED_OUTPATIENT_CLINIC_OR_DEPARTMENT_OTHER): Payer: Self-pay | Admitting: Pharmacist

## 2022-04-18 ENCOUNTER — Other Ambulatory Visit (HOSPITAL_BASED_OUTPATIENT_CLINIC_OR_DEPARTMENT_OTHER): Payer: Self-pay | Admitting: Pharmacist

## 2022-04-18 DIAGNOSIS — Z7901 Long term (current) use of anticoagulants: Secondary | ICD-10-CM | POA: Insufficient documentation

## 2022-04-18 DIAGNOSIS — Z952 Presence of prosthetic heart valve: Secondary | ICD-10-CM | POA: Insufficient documentation

## 2022-04-18 LAB — PROTHROMBIN TIME
Prothrombin INR: 4.1 — ABNORMAL HIGH (ref 0.8–1.3)
Prothrombin Time Patient: 40.4 s — ABNORMAL HIGH (ref 10.7–15.6)

## 2022-04-18 NOTE — Telephone Encounter (Signed)
ANTICOAGULATION TREATMENT PLAN     Indication: MVR; recurrent CVA  Goal INR: 2.5-3.5  Duration of Therapy: chronic     Hemorrhagic Risk Score: 1  Warfarin Tablet Size: 5mg      Relevant Historic Information: CVA 2008; 2005; 2006     Referring Provider: 04-05-1976     02/08/16: Pacemaker generator change INR goal ~ 2.5 with no heparin bridge.  3/18: stopped smoking ~12/13/16  Aug 2018: started smoking again  11/11/18:discharge from service due to noncompliance with f/u  06/13/21: re-establish care         SUBJECTIVE:   Walter Rice was last evaluated by [facility] on 03/07/22. His INR was 2.4 and he was instructed to continue warfarin 7.5mg  MF, 5mg  all other days.       Today, pt reports by phone:      S/sx of bleeding/bruising: No, Denies problems in this area.  No melena.   S/sx of CVA/TIA (HA, visual changes):  No, Denies problems in this area   Diet/Appetite: Pt is generally eating regularly.  He has salads 1-2 times a week.  He is planning on having a salad this evening.  His appetite was low 2 weeks ago for the first couple of days he was ill.  After that, his appetite progressively returned to normal.   EtOH:  Pt had a couple sips of wine the other day he was out with his girlfriend.   Tobacco: denies use  Activity:   His activity level is back to normal after having COVID 2 weeks ago.  He does not formally workout. He is able to do day-to-day activities.   Acute illness:  Pt tested positive 2 weeks ago for COVID.  This is the second time he has had COVID.  It was not as bad as the first time he had it. The first 2 days he had a really bad headache unresponsive to Tylenol.  Then he progressively felt better.  Pt quarantined for 7 days.    Upcoming procedures:  denies  Medication changes: denies      Present dose: Warfarin 7.5mg  MF, 5mg  all other days    OBJECTIVE:     Relevant medication changes:   Med list updated/reviewed with patient today.      LABS:   Lab Results   Component Value Date    INR 4.1  04/18/2022    INR 2.4 03/07/2022    INR 2.8 01/24/2022    INR 2.5 01/03/2022    INR 2.6 12/13/2021    INR 3.5 11/29/2021     ASSESSMENT:   Walter Rice is anticoagulated with warfarin for MVR and recurrent CVA with an INR goal of 2.5-3.5.    INR elevated without clear explanation but may have been a result of acute illness recently during COVID.  Pt is feeling much better and is back to his normal activities and diet.   Pt without any new bleeding concerns.  A one -time interruption in dose indicated to reduce his bleeding risk, promptly.     PLAN:   No warfarin today.  Tomorrow, resume warfarin 7.5mg  MF, 5mg  all other days (40mg /wk)  Return in 2 weeks (on 05/02/2022). INR @ [place].   Walter Rice verbally expressed understanding of the above plan.        [name], PharmD , CACP                This visit is being conducted over the telephone at the patient's  request: Yes  Patient gives verbal consent to proceed and knows there may be a copay/deductible: Yes    Time spent with patient/guardian on this telephone visit: 10  minutes

## 2022-04-18 NOTE — Telephone Encounter (Signed)
Current instructions as of 04/18/22.

## 2022-05-02 ENCOUNTER — Ambulatory Visit
Admission: RE | Admit: 2022-05-02 | Discharge: 2022-05-02 | Disposition: A | Discharge status: Home / Self Care | Payer: Medicare Other | Source: Ambulatory Visit | Attending: Cardiovascular Disease | Admitting: Cardiovascular Disease

## 2022-05-02 ENCOUNTER — Other Ambulatory Visit (HOSPITAL_BASED_OUTPATIENT_CLINIC_OR_DEPARTMENT_OTHER): Payer: Self-pay | Admitting: Pharmacist

## 2022-05-02 ENCOUNTER — Ambulatory Visit (HOSPITAL_BASED_OUTPATIENT_CLINIC_OR_DEPARTMENT_OTHER): Payer: Medicare Other

## 2022-05-02 DIAGNOSIS — Z7901 Long term (current) use of anticoagulants: Secondary | ICD-10-CM | POA: Insufficient documentation

## 2022-05-02 LAB — PROTHROMBIN TIME
Prothrombin INR: 3.9 — ABNORMAL HIGH (ref 0.8–1.3)
Prothrombin Time Patient: 38.6 s — ABNORMAL HIGH (ref 10.7–15.6)

## 2022-05-05 ENCOUNTER — Ambulatory Visit: Payer: Medicare Other | Admitting: Pharmacist

## 2022-05-05 DIAGNOSIS — Z952 Presence of prosthetic heart valve: Secondary | ICD-10-CM

## 2022-05-05 NOTE — Telephone Encounter (Signed)
ANTICOAGULATION TREATMENT PLAN     Indication: MVR; recurrent CVA  Goal INR: 2.5-3.5  Duration of Therapy: chronic     Hemorrhagic Risk Score: 1  Warfarin Tablet Size: 5mg      Relevant Historic Information: CVA 2008; 2005; 2006     Referring Provider: 04-05-1976     02/08/16: Pacemaker generator change INR goal ~ 2.5 with no heparin bridge.  3/18: stopped smoking ~12/13/16  Aug 2018: started smoking again  11/11/18:discharge from service due to noncompliance with f/u  06/13/21: re-establish care         SUBJECTIVE:   Walter Rice was last evaluated by [facility] on 04/18/22. His INR was 4.1 and he was instructed to hold one dose of warfarin that day then resume warfarin 7.5mg  MF, 5mg  all other days.       Today, pt reports by phone:     S/sx of bleeding/bruising: Yes,  05/02/22, he sustained a head wound when a treadmill came down on the back of his head.  Pt was able to get the bleeding to stop quickly.  He did not seek any medical attending. Denies headache, nausea, vomiting, dizziness, blurred vision, confusion. The wound his healing.   S/sx of CVA/TIA (HA, visual changes):  No, Denies problems in this area   Diet/Appetite: Pt is eating regularly.  He did not have any salads in the last 2 weeks. He likes salad.  Pt will be more consistent with this in his diet and try to have 2 servings a week.   EtOH:  Pt had a couple sips of his girlfriends beer, yesterday.   Tobacco: denies use  Activity:   Has been pretty good lately.  He is working a lot around .   Acute illness: denies illness  Upcoming procedures:  denies  Medication changes: denies      Present dose: Warfarin 7.5mg  MF, 5mg  all other days (held one dose 04/18/22).     OBJECTIVE:     Relevant medication changes: None.      LABS:   Lab Results   Component Value Date    INR 3.9 05/02/2022    INR 4.1 04/18/2022    INR 2.4 03/07/2022    INR 2.8 01/24/2022    INR 2.5 01/03/2022    INR 2.6 12/13/2021    INR 4.5 01/22/2012       ASSESSMENT:   Walter Rice is anticoagulated with warfarin for MVR  and recurrent CVA with an INR goal of 2.5-3.5.    INR elevated likely in response to a reduction in oral intake of vitamin K containing foods. Pt is motivated to have 2 servings a week of vitamin K foods (salads) and will make a concerted effort this next couple of weeks. Consider a reduction in warfarin maintenance dose if the next INR is above goal. Will hold one dose of warfarin to correct his bleeding risk rapidly.     Advised patient to seek medical attention for any new headache, nausea, vomiting, dizziness, blurred vision, confusion.     PLAN:   No warfarin today. Then continue warfarin 7.5mg  MF, 5mg  all other days (40mg /wk)  Return in 11 days (on 05/16/2022). INR @ [facility].   Jackey verbally expressed understanding of the above plan.        [name], PharmD , CACP             This visit is being conducted over the telephone at the patient's request: Yes  Patient gives verbal consent to proceed and knows there may be a copay/deductible: Yes    Time spent with patient/guardian on this telephone visit: 10 minutes

## 2022-05-16 ENCOUNTER — Other Ambulatory Visit (HOSPITAL_BASED_OUTPATIENT_CLINIC_OR_DEPARTMENT_OTHER): Payer: Self-pay | Admitting: Pharmacist

## 2022-05-16 ENCOUNTER — Ambulatory Visit: Payer: Medicare Other | Attending: Cardiovascular Disease | Admitting: Pharmacist

## 2022-05-16 DIAGNOSIS — Z7901 Long term (current) use of anticoagulants: Secondary | ICD-10-CM | POA: Insufficient documentation

## 2022-05-16 DIAGNOSIS — Z952 Presence of prosthetic heart valve: Secondary | ICD-10-CM | POA: Insufficient documentation

## 2022-05-16 LAB — PROTHROMBIN TIME
Prothrombin INR: 5.9 (ref 0.8–1.3)
Prothrombin Time Patient: 54.2 s — ABNORMAL HIGH (ref 10.7–15.6)

## 2022-05-16 NOTE — Telephone Encounter (Signed)
ANTICOAGULATION TREATMENT PLAN     Indication: MVR; recurrent CVA  Goal INR: 2.5-3.5  Duration of Therapy: chronic     Hemorrhagic Risk Score: 1  Warfarin Tablet Size: 5mg      Relevant Historic Information: CVA 2008; 2005; 2006     Referring Provider: 04-05-1976     02/08/16: Pacemaker generator change INR goal ~ 2.5 with no heparin bridge.  3/18: stopped smoking ~12/13/16  Aug 2018: started smoking again  11/11/18:discharge from service due to noncompliance with f/u  06/13/21: re-establish care      SUBJECTIVE:    Walter Rice was last evaluated by [facility] on 05/05/22, with INR = 3.9, and instructed to hold warfarin x1 dose,   then resume 7.5mg  MF, 5mg  other days (40mg /wk), and follow up in 11 days.    Visit type: RETURN Phone Visit  An interpreter was not needed for the visit.  Conducted the visit with: patient, Walter Rice    Missed doses: missed a dose on 05/14/22, took 5mg  on Wednesday, and 7.5mg  Thursday, thinking was Friday    Hemorrhagic Sx: no issues/complaints  CVA, TIA, headache: no issues/complaints  Thrombotic Sx: N/A  Acute illness/changes in health: none, recovered from Covid, states he feels great, no residual symptoms, appetite good  Diet/Appetite: eating greens 2 time(s) per WEEK, but did not have any for the last 2 weeks  Alcohol use: had 1-2 sips of wine yesterday, otherwise none  Tobacco use: does not use tobacco products  Activity level: unchanged since last ACC visit  Upcoming procedures: none  Other: N/A  Relevant med changes (RX, OTC, supplements): none    OBJECTIVE / LABS:  DATE INR Warfarin Dose   03/07/22 2.4 7.5mg  MF, 5mg  AOD   04/18/22 4.1 Hold x1, 7.5mg  MF, 5mg  AOD   05/02/22 3.9 Hold x1, 7.5mg  MF, 5mg  AOD   05/16/22 5.9 PLAN: hold   05/17/22  hold   05/18/22  2.5mg    05/19/22  5mg    05/20/22  5mg    05/21/22  5mg    05/22/22  5mg    05/23/22 INR        ASSESSMENT:  INR supratherapeutic   with no clear etiology. He missed his dose 2 days ago, and good a higher dose yesterday, but  would have expected his INR to be lower from this. He has not had greens for 2 weeks, which playing a role, but not to this extend. May be a culmination of recent illness and still recovering from Covid (patients can continue to be warfarin sensitive for a period of time after symptoms subside), and no greens in diet. He will add greens back in. Unfortunately, he is unable to recheck his INR until 05/23/22, due to transportation limitations. Tried to recheck INR on 05/19/22, but he states he absolutely cannot check until 05/23/22.    PLAN:  Hold warfarin x2 days,  2.5mg  on 05/18/22, then 5mg  daily (35mg /wk).  Return in 1 week (on 05/23/2022).  Patient expressed understanding of this plan.       This visit is being conducted over the telephone at the patient's request: Yes  Patient gives verbal consent to proceed and knows there may be a copay/deductible: Yes  Provider location : On-site location (clinic, hospital, on-site office)  Time spent with patient/guardian on this telephone visit: 10 minutes  Given the importance of social distancing and other strategies recommended to reduce the risk of COVID-19 transmission, I am providing medical care to this patient via a telephone  visit in place of an in person visit at the request of the patient.    Pauline Aus, PharmD

## 2022-05-23 ENCOUNTER — Other Ambulatory Visit (HOSPITAL_BASED_OUTPATIENT_CLINIC_OR_DEPARTMENT_OTHER): Payer: Self-pay | Admitting: Pharmacist

## 2022-05-23 ENCOUNTER — Ambulatory Visit: Payer: Medicare Other | Attending: Cardiovascular Disease | Admitting: Pharmacist

## 2022-05-23 DIAGNOSIS — Z7901 Long term (current) use of anticoagulants: Secondary | ICD-10-CM

## 2022-05-23 DIAGNOSIS — Z952 Presence of prosthetic heart valve: Secondary | ICD-10-CM | POA: Insufficient documentation

## 2022-05-23 LAB — PROTHROMBIN TIME
Prothrombin INR: 4.9 — ABNORMAL HIGH (ref 0.8–1.3)
Prothrombin Time Patient: 46.8 s — ABNORMAL HIGH (ref 10.7–15.6)

## 2022-05-23 NOTE — Telephone Encounter (Signed)
ANTICOAGULATION TREATMENT PLAN     Indication: MVR; recurrent CVA  Goal INR: 2.5-3.5  Duration of Therapy: chronic     Hemorrhagic Risk Score: 1  Warfarin Tablet Size: 5mg      Relevant Historic Information: CVA 2008; 2005; 2006     Referring Provider: 04-05-1976     02/08/16: Pacemaker generator change INR goal ~ 2.5 with no heparin bridge.  3/18: stopped smoking ~12/13/16  Aug 2018: started smoking again  11/11/18:discharge from service due to noncompliance with f/u  06/13/21: re-establish care    SUBJECTIVE:  Walter Rice was last evaluated by [name] on 05/16/22. His INR was 5.9 (goal: 2.5-3.5)  and he was instructed to adjust the warfarin dose as per table below, with follow-up in 1 week (on 05/23/22) as this was the soonest he could agree to.    I spoke to [place] for today's assessment:    S/sx of bleeding/bruising: denies   Signs/sxs of stroke/TIA or DVT/PE:  denies  Acute changes in health: denies  Diet/Appetite: He reports that foods don't seem as appealing and appetite is lower since having Covid in July. He has greens on occasion but not on a regular basis. He did not have any greens this week.    Alcohol use: None since last visit.   Tobacco/marijuana use: None.  Activity level: "good"     UPCOMING PROCEDURES: None reported.    RELEVANT MEDICATION/OTC/SUPPLEMENT CHANGES: None since last ACC visit.    WARFARIN DOSE:   Current dose: Warfarin HELD x2 days (8/11-8/12) then reduced as per table below.  No warfarin dosing errors reported.    OBJECTIVE / LABS:  DATE INR Warfarin Dose   03/07/22 2.4 7.5mg  MF, 5mg  AOD   04/18/22 4.1 Hold x1, 7.5mg  MF, 5mg  AOD   05/02/22 3.9 Hold x1, 7.5mg  MF, 5mg  AOD   05/16/22 5.9 PLAN: hold   05/17/22   hold   05/18/22   2.5mg    05/19/22   5mg    05/20/22   5mg    05/21/22   5mg    05/22/22   5mg    05/23/22 4.9 PLAN: hold x2 days, then 2.5mg  MF, 5mg     05/28/22 INR       ASSESSMENT:   INR remains supratherapeutic (goal 2.5-3.5) despite warfarin dose adjustment made at last visit. No signs/sxs of  bruising/bleeding or of thrombosis/thromboembolism.    Warfarin sensitivity has been high since pt contracted Covid in July. Although he reports having fully recovered, his appetite and food intake appear to be lagging. I informed him that we need to monitor the INR more closely until stable in order to avoid complications. We will send orders to a lab closer to his home which will enable him to check more frequently.     PLAN:   1.   HOLD  warfarin x2 days, on 8/18 and 05/24/22, then decrease to 2.5mg  MF, 5mg  all others (30mg /week)  2.  Return in 5 days (on 05/28/2022).   3.  Instructed to monitor for all s/sx of bleeding/bruising or thromboembolism and seek medical evaluation if necessary.  4.  Walter Rice verbally acknowledged understanding of this plan.    , PharmD  05/23/22 10:51 AM    This visit is being conducted over the telephone at the patient's request: Yes  Patient gives verbal consent to proceed and knows there may be a copay/deductible: Yes  Time spent with patient/guardian on this telephone visit: 10 minutes  Provider location: On-site location (clinic, hospital, on-site office)

## 2022-05-28 ENCOUNTER — Ambulatory Visit (HOSPITAL_BASED_OUTPATIENT_CLINIC_OR_DEPARTMENT_OTHER): Payer: Medicare Other

## 2022-05-28 LAB — PROTHROMBIN TIME: Prothrombin INR, External: 3.2

## 2022-05-29 ENCOUNTER — Other Ambulatory Visit (HOSPITAL_BASED_OUTPATIENT_CLINIC_OR_DEPARTMENT_OTHER): Payer: Self-pay | Admitting: Pharmacist

## 2022-05-29 ENCOUNTER — Ambulatory Visit: Payer: Medicare Other | Admitting: Pharmacist

## 2022-05-29 DIAGNOSIS — Z952 Presence of prosthetic heart valve: Secondary | ICD-10-CM

## 2022-05-29 DIAGNOSIS — I059 Rheumatic mitral valve disease, unspecified: Secondary | ICD-10-CM

## 2022-05-29 DIAGNOSIS — Z7901 Long term (current) use of anticoagulants: Secondary | ICD-10-CM

## 2022-05-29 NOTE — Telephone Encounter (Signed)
ANTICOAGULATION TREATMENT PLAN     Indication: MVR; recurrent CVA  Goal INR: 2.5-3.5  Duration of Therapy: chronic     Hemorrhagic Risk Score: 1  Warfarin Tablet Size: 5mg      Relevant Historic Information: CVA 2008; 2005; 2006     Referring Provider: 04-05-1976     02/08/16: Pacemaker generator change INR goal ~ 2.5 with no heparin bridge.  3/18: stopped smoking ~12/13/16  Aug 2018: started smoking again  11/11/18:discharge from service due to noncompliance with f/u  06/13/21: re-establish care    SUBJECTIVE:   Walter Rice was last evaluated by [facility] on 05/23/22 regarding his supratherapeutic INR.     Pt was advised to hold 2 doses and lower his weekly dose .  He was rescheduled for next INR in five days.    I spoke to pt about his INR result from yesterday.      Signs of bleeding/bruising: none reported  Signs/sxs of stroke/TIA: none reported  Acute changes in health: none reported  Diet: had one serving of green salad on 05/31/22. He usually consumes one serving of greens occasionally (once every week or 2 weeks)  Appetite: had been overall low since he had covid last July although reports improvement since 2 days ago  Activity level: denies changes  Alcohol use: denies use  Tobacco use: denies use  Other: none      Upcoming procedures: none        RELEVANT MEDICATION/OTC/SUPPLEMENT CHANGES: none     OBJECTIVE:   Present warfarin dose: Warfarin dose as below    OBJECTIVE / LABS:  DATE INR Warfarin Dose   03/07/22 2.4 7.5mg  MF, 5mg  AOD   04/18/22 4.1 Hold x1, 7.5mg  MF, 5mg  AOD   05/02/22 3.9 Hold x1, 7.5mg  MF, 5mg  AOD   05/16/22 5.9 PLAN: hold   05/17/22   hold   05/18/22   2.5mg    05/19/22   5mg    05/20/22   5mg    05/21/22   5mg    05/22/22   5mg    05/23/22 4.9 hold x2 days, then 2.5mg  Su (instead of Mon)/F, 5mg     05/28/22 3.2 Plan: 2.5mg  MF, 5mg  aod    06/04/22 INR            LABS:   Lab Results   Component Value Date    INR 3.2 05/28/2022    INR 4.9 05/23/2022    INR 5.9 05/16/2022    INR 3.9 05/02/2022    INR 4.1  04/18/2022    INR 4.5 01/22/2012            ASSESSMENT:   INR has trended down and within therapeutic range following warfarin dose adjustment.  Improvement in appetite could increase his warfarin requirement in future.  However, since current INR is on higher end of therapeutic range after two held doses last week, it is reasonable to continue on current dose and monitor INR closely until current dose reaches steady state    PLAN:   1.  Take warfarin 2.5 mg on Mon/Fri and 5 mg on all other days   2.  Return in 6 days (on 06/04/2022).  3.  Send above plan via mychart  4.  Pt verbalized understanding and agreed to above plan.    This visit is being conducted over the telephone at the patient's request: Yes  Patient gives verbal consent to proceed and knows there may be a copay/deductible: Yes    Time spent with patient/guardian on  this telephone visit: 5 minutes        Royston Bake, PharmD MS CGP

## 2022-06-04 ENCOUNTER — Ambulatory Visit (HOSPITAL_BASED_OUTPATIENT_CLINIC_OR_DEPARTMENT_OTHER): Payer: Medicare Other

## 2022-06-04 LAB — PROTHROMBIN TIME: Prothrombin INR, External: 3.1

## 2022-06-05 ENCOUNTER — Other Ambulatory Visit (HOSPITAL_BASED_OUTPATIENT_CLINIC_OR_DEPARTMENT_OTHER): Payer: Self-pay | Admitting: Pharmacist

## 2022-06-05 ENCOUNTER — Ambulatory Visit: Payer: Medicare Other | Admitting: Pharmacist

## 2022-06-05 DIAGNOSIS — Z952 Presence of prosthetic heart valve: Secondary | ICD-10-CM

## 2022-06-05 DIAGNOSIS — Z7901 Long term (current) use of anticoagulants: Secondary | ICD-10-CM

## 2022-06-05 DIAGNOSIS — I059 Rheumatic mitral valve disease, unspecified: Secondary | ICD-10-CM

## 2022-06-05 NOTE — Telephone Encounter (Signed)
ANTICOAGULATION TREATMENT PLAN     Indication: MVR; recurrent CVA  Goal INR: 2.5-3.5  Duration of Therapy: chronic     Hemorrhagic Risk Score: 1  Warfarin Tablet Size: 5mg      Relevant Historic Information: CVA 2008; 2005; 2006     Referring Provider: 04-05-1976     02/08/16: Pacemaker generator change INR goal ~ 2.5 with no heparin bridge.  3/18: stopped smoking ~12/13/16  Aug 2018: started smoking again  11/11/18:discharge from service due to noncompliance with f/u  06/13/21: re-establish care    SUBJECTIVE:   Walter Rice was last evaluated by [facility] ACC on 05/29/22 regarding his therapeutic INR.     His warfarin dose was decreased slightly to maintain therapeutic INR .  He was rescheduled for next INR in one week.    I spoke to pt about his INR result from yesterday .      Signs of bleeding/bruising: none reported  Signs/sxs of stroke/TIA: none reported  Acute changes in health: none reported  Diet: no changes, consumes occasional serving of greens (one serving every 1-2 weeks)  Appetite: continues to improve, was low for prolonged period of time s/p covid infection in July  Activity level: stays active with walking his dog daily  Alcohol use: did not assess  Tobacco use: did not assess  Other: none      Upcoming procedures: none.  Has cardiology f/u on 06/12/22       RELEVANT MEDICATION/OTC/SUPPLEMENT CHANGES: none     OBJECTIVE:   Present warfarin dose: as below    OBJECTIVE / LABS:  DATE INR Warfarin Dose   03/07/22 2.4 7.5mg  MF, 5mg  AOD   04/18/22 4.1 Hold x1, 7.5mg  MF, 5mg  AOD   05/02/22 3.9 Hold x1, 7.5mg  MF, 5mg  AOD   05/16/22 5.9 PLAN: hold   05/17/22   hold   05/18/22   2.5mg    05/19/22   5mg    05/20/22   5mg    05/21/22   5mg    05/22/22   5mg    05/23/22 4.9 hold x2 days, then 2.5mg  Su (instead of Mon)/F, 5mg     05/28/22 3.2 2.5mg  MF, 5mg  aod    06/04/22 3.1 Plan: 2.5mg  MF, 5mg  aod    06/12/22        LABS:   Lab Results   Component Value Date    INR 3.1 06/04/2022    INR 3.2 05/28/2022    INR 4.9 05/23/2022    INR 5.9  05/16/2022    INR 3.9 05/02/2022    INR 4.1 04/18/2022    INR 4.5 01/22/2012            ASSESSMENT:   Therapeutic INR following warfarin dose adjustment one week ago.  Will monitor INR closely until current dose reaches steady state.  If INR is stable next week, can consider extending INR testing interval    PLAN:   1.  Continue warfarin dose 2.5 mg on Mon/Fri and 5 mg on all other days   2.  Return in 1 week (on 06/12/2022).  3.  Pt verbalized understanding and agreed to above plan.    This visit is being conducted over the telephone at the patient's request: Yes  Patient gives verbal consent to proceed and knows there may be a copay/deductible: Yes    Time spent with patient/guardian on this telephone visit: 5 minutes    06/06/22, PharmD MS CGP

## 2022-06-11 ENCOUNTER — Other Ambulatory Visit (HOSPITAL_BASED_OUTPATIENT_CLINIC_OR_DEPARTMENT_OTHER): Payer: Self-pay | Admitting: Pharmacist

## 2022-06-11 DIAGNOSIS — Z952 Presence of prosthetic heart valve: Secondary | ICD-10-CM

## 2022-06-11 NOTE — Progress Notes (Signed)
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 1999.  2008 gen change.  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     A) Status post repair with ventricular septal defect closure in February 1989.   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 Jude prosthetic valve in 1993.        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  According to interrogation by Dr. Tomasa Blase in March, 2021, there is no underlying P wave. Capture thresholds were acceptable.            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  TX FROM [facility]      Headache [R51.9]      TX FROM [facility]      Mitral valve replaced [Z95.2] 04/13/2013    Chronic anticoagulation [Z79.01] 02/07/2013     Reading ACC ENROLLED  11/11/18: discharged from service for noncompliance with f/u  06/13/21: re-enrolled         HISTORY OF PRESENT ILLNESS     Dvon Jiles is a 36 year old male with ***. He denies shortness of breath, palpitations, chest pain, dizziness, or syncope.       MEDICATIONS  Current Outpatient Medications    Medication Sig Dispense Refill    Acetaminophen 500 MG Oral Tab Take 2 tablet by mouth every 8 hours if needed to relieve pain 60 Tab prn    warfarin 5 MG tablet As of 05/29/2022: Take 2.5 mg on Mon/Fri and 5 mg on all other days  or as directed by [facility]      warfarin 5 MG tablet Take 1-1/2 tablets (7.5mg ) on Mon/Wed/Fri and 1 tab (5mg ) all other days or as directed by [facility] 108 tablet 2     No current facility-administered medications for this visit.       ALLERGIES  Review of patient's allergies indicates:  Allergies   Allergen Reactions    Sulfa Antibiotics Skin: Rash        REVIEW OF SYSTEMS  A complete review of systems was performed.  Pertinent positives are included in the interval history and the patient reports***. All remaining systems were reviewed and were negative.     PHYSICAL EXAMINATION     There were no vitals filed for this visit.  General appearance:  alert, adult male in no acute distress  HEENT: Mucous membranes are moist and sclerae anicteric   NEURO: PERL, Symmetrically moves all extremities, facial expressions  symmetrical, no slurred speech, answers questions appropriately  Thorax/Lungs:   Lungs clear to auscultation without rales or rhonchi. Normal effort of breathing  Heart: Regular rate and rhythm. ***  ***JVD  Abdomen: Soft nontender nondistended, no hepatomegaly  Musculoskeletal: Warm and well perfused. Radial pulses 2+ bilaterally  Skin and nails: No clubbing cyanosis or edema. No skin rashes.       DIAGNOSTIC TESTING         LABORATORY TESTING  Results for orders placed or performed in visit on 06/05/22   Prothrombin Time   Result Value Ref Range    Prothrombin Time Patient, External      Prothrombin INR, External 3.1     Performing Laboratory, External          ASSESSMENT AND PLAN     Johnmark Geiger is a 36 year old male with***    Berenice Bouton, ARNP

## 2022-06-12 ENCOUNTER — Ambulatory Visit (HOSPITAL_BASED_OUTPATIENT_CLINIC_OR_DEPARTMENT_OTHER): Payer: Self-pay | Admitting: Cardiology Med

## 2022-06-12 ENCOUNTER — Encounter (HOSPITAL_COMMUNITY): Payer: Medicare Other

## 2022-06-12 ENCOUNTER — Ambulatory Visit (HOSPITAL_BASED_OUTPATIENT_CLINIC_OR_DEPARTMENT_OTHER): Payer: Medicare Other

## 2022-06-12 DIAGNOSIS — Z952 Presence of prosthetic heart valve: Secondary | ICD-10-CM

## 2022-06-18 ENCOUNTER — Other Ambulatory Visit (HOSPITAL_BASED_OUTPATIENT_CLINIC_OR_DEPARTMENT_OTHER): Payer: Self-pay | Admitting: Cardiovascular Disease

## 2022-06-19 LAB — CARDIAC DEVICE CHECK - REMOTE
AT Burden Percent: 0.4
Atrial Pacing Percent: 95
Battery Remaining Longevity: 72
Battery Voltage: 2.77
RA Impedance: 270
RA Threshold Amplitude: 1
RV Impedance: 429
RV Pacing Percent: 8
RV Threshold Amplitude: 0.625

## 2022-06-26 ENCOUNTER — Encounter (HOSPITAL_BASED_OUTPATIENT_CLINIC_OR_DEPARTMENT_OTHER): Payer: Self-pay

## 2022-06-26 NOTE — Progress Notes (Signed)
Walter Rice is 2 weeks overdue for lab testing related to anticoagulant therapy.  Left reminder message by telephone.

## 2022-07-02 ENCOUNTER — Ambulatory Visit: Payer: Medicare Other | Attending: Cardiovascular Disease

## 2022-07-02 ENCOUNTER — Ambulatory Visit (HOSPITAL_BASED_OUTPATIENT_CLINIC_OR_DEPARTMENT_OTHER): Payer: Medicare Other

## 2022-07-02 DIAGNOSIS — I495 Sick sinus syndrome: Secondary | ICD-10-CM | POA: Insufficient documentation

## 2022-07-02 DIAGNOSIS — Z950 Presence of cardiac pacemaker: Secondary | ICD-10-CM | POA: Insufficient documentation

## 2022-07-03 LAB — PROTHROMBIN TIME: Prothrombin INR, External: 2.2

## 2022-07-04 ENCOUNTER — Ambulatory Visit: Payer: Medicare Other | Attending: Cardiovascular Disease | Admitting: Pharmacist

## 2022-07-04 ENCOUNTER — Other Ambulatory Visit (HOSPITAL_BASED_OUTPATIENT_CLINIC_OR_DEPARTMENT_OTHER): Payer: Self-pay | Admitting: Pharmacist

## 2022-07-04 DIAGNOSIS — Z952 Presence of prosthetic heart valve: Secondary | ICD-10-CM

## 2022-07-04 NOTE — Telephone Encounter (Signed)
ANTICOAGULATION TREATMENT PLAN     Indication: MVR; recurrent CVA  Goal INR: 2.5-3.5  Duration of Therapy: chronic  Referral renewal: 06/11/2022     Hemorrhagic Risk Score: 1  Warfarin Tablet Size: 5mg      Relevant Historic Information: CVA 2008; 2005; 2006     Referring Provider: Aggie Cosier     02/08/16: Pacemaker generator change INR goal ~ 2.5 with no heparin bridge.  3/18: stopped smoking ~12/13/16  Aug 2018: started smoking again  11/11/18:discharge from service due to noncompliance with f/u  06/13/21: re-establish care  07/04/22: denies tobacco use    SUBJECTIVE:  Walter Rice was last evaluated by Walter Rice on 06/05/22. His INR was 3.1 (goal: 2.5-3.5) and he was instructed to continue his current warfarin dose (see below). Pt is 3 weeks OVERDUE for INR check.    I spoke to Walter Rice for today's assessment:    S/sx of bleeding/bruising: denies   Signs/sxs of stroke/TIA or DVT/PE:  denies  Acute changes in health: had a headache, responded to a dose of Excedrin- advised to take Tylenol for future headaches  Diet/Appetite: No changes. Consumes greens occasionally. None this past week.    Alcohol use: Had alcohol (beer) a few days ago at a birthday. Drinks on occasion, Avoids hard liquor.  Tobacco/marijuana use: None.  Activity level: Walks his dog daily.  Other: missed cardiology appt on 06/12/22 d/t insurance issues, in the process of getting Medicaid     UPCOMING PROCEDURES: None reported.    RELEVANT MEDICATION/OTC/SUPPLEMENT CHANGES: 1x Excedrin use (see above)    WARFARIN DOSE:   Current dose: 2.5mg  MF, 5mg  all others.  Missed warfarin dose yesterday.    OBJECTIVE / LABS:  DATE INR Warfarin Dose   03/07/22 2.4 7.5mg  MF, 5mg  AOD   04/18/22 4.1 Hold x1, 7.5mg  MF, 5mg  AOD   05/02/22 3.9 Hold x1, 7.5mg  MF, 5mg  AOD   05/16/22 5.9 PLAN: hold   05/17/22   hold   05/18/22   2.5mg    05/19/22   5mg    05/20/22   5mg    05/21/22   5mg    05/22/22   5mg    05/23/22 4.9 hold x2 days, then 2.5mg  Su (instead of Mon)/F, 5mg      05/28/22 3.2 2.5mg  MF, 5mg  aod    06/04/22 3.1 2.5mg  MF, 5mg  aod    06/12/22 No show 2.5mg  MF, 5mg  aod    07/03/22 2.2 Missed dose 9/28  Plan: 7.5mg  9/29, 2.5mg  MF, 5mg  aod   07/15/22 INR       ASSESSMENT:   INR is subtherapeutic (goal: 2.5-3.5) in pt overdue for INR and ACC follow-up. No signs/sxs of bruising/bleeding or of thrombosis/thromboembolism.    Unclear what his warfarin requirement is at this time. Advised adherence to appts to avoid risks of under- or over-anticoagulation.  Recommended close monitoring until stable warfarin dose has been determined.    PLAN:   1.  Take warfarin 7.5mg  (instead of 2.5mg ) today on 9/29, then resume 2.5mg  MF, 5mg  all others (30mg /week).  2.  Return in 11 days (on 07/15/2022).  3.  Instructed to monitor for all s/sx of bleeding/bruising or thromboembolism and seek medical evaluation if necessary.  Walter Rice verbally acknowledged understanding of this plan.    [name], PharmD  07/04/22 9:24 AM    This visit is being conducted over the telephone at the patient's request: Yes  Patient gives verbal consent to proceed and knows there Walter be a  copay/deductible: Yes  Time spent with patient/guardian on this telephone visit: 10 minutes  Provider location: Off-site location (home, non-[place] location)

## 2022-07-12 ENCOUNTER — Encounter (INDEPENDENT_AMBULATORY_CARE_PROVIDER_SITE_OTHER): Payer: Self-pay

## 2022-07-12 ENCOUNTER — Encounter (INDEPENDENT_AMBULATORY_CARE_PROVIDER_SITE_OTHER): Payer: Medicare Other | Admitting: Student in an Organized Health Care Education/Training Program

## 2022-07-12 NOTE — Progress Notes (Signed)
Walter Rice did not cancel and was not present for a scheduled appointment today.  Disposition: Defer to pt to reschedule

## 2022-07-15 ENCOUNTER — Ambulatory Visit (HOSPITAL_BASED_OUTPATIENT_CLINIC_OR_DEPARTMENT_OTHER): Payer: Self-pay

## 2022-07-18 LAB — PROTHROMBIN TIME: Prothrombin INR, External: 2.9

## 2022-07-21 ENCOUNTER — Other Ambulatory Visit (HOSPITAL_BASED_OUTPATIENT_CLINIC_OR_DEPARTMENT_OTHER): Payer: Self-pay | Admitting: Pharmacist

## 2022-07-21 ENCOUNTER — Ambulatory Visit: Payer: Medicare Other | Admitting: Pharmacist

## 2022-07-21 DIAGNOSIS — Z952 Presence of prosthetic heart valve: Secondary | ICD-10-CM

## 2022-07-21 NOTE — Telephone Encounter (Signed)
ANTICOAGULATION TREATMENT PLAN     Indication: MVR; recurrent CVA  Goal INR: 2.5-3.5  Duration of Therapy: chronic  Referral renewal: 06/11/2022     Hemorrhagic Risk Score: 1  Warfarin Tablet Size: 5mg      Relevant Historic Information: CVA 2008; 2005; 2006     Referring Provider: 04-05-1976     02/08/16: Pacemaker generator change INR goal ~ 2.5 with no heparin bridge.  3/18: stopped smoking ~12/13/16  Aug 2018: started smoking again  11/11/18:discharge from service due to noncompliance with f/u  06/13/21: re-establish care  07/04/22: denies tobacco use         SUBJECTIVE:   Kosta Schnitzler was last evaluated by [facility] on 07/04/22. His INR was 2.2  9/28/23and he was instructed to 7.5 mg 07/04/22 then resume 2.5mg  MF, 5mg  all other days.       Today, pt reports by phone:      S/sx of bleeding/bruising: No, Denies problems in this area  S/sx of CVA/TIA (HA, visual changes):  No, Denies problems in this area   Diet/Appetite: Pt is eating regularly. He has had some green vegetables in the diet eat week.  A few days ago he had a serving of kale.   EtOH: denies use  Tobacco: denies use  Activity:   Pt has been walking a lot more in general.     Recently he walked 2 and 1/2 hrs to his girlfriends house.   Acute illness:  Pt has not had any fever/cough/cold.    Pt states he had UTI symptoms last month.  Symptoms resolved.    Upcoming procedures:   denies         Present dose: Warfarin 2.5mg  MF, 5mg  all other days    DATE INR Warfarin Dose   03/07/22 2.4 7.5mg  MF, 5mg  AOD   04/18/22 4.1 Hold x1, 7.5mg  MF, 5mg  AOD   05/02/22 3.9 Hold x1, 7.5mg  MF, 5mg  AOD   05/16/22 5.9 PLAN: hold   05/17/22   hold   05/18/22   2.5mg    05/19/22   5mg    05/20/22   5mg    05/21/22   5mg    05/22/22   5mg    05/23/22 4.9 hold x2 days, then 2.5mg  Su (instead of Mon)/F, 5mg     05/28/22 3.2 2.5mg  MF, 5mg  aod    06/04/22 3.1 2.5mg  MF, 5mg  aod    06/12/22 No show 2.5mg  MF, 5mg  aod    07/03/22 2.2 Missed dose 9/28  Plan: 7.5mg  9/29, 2.5mg  MF, 5mg  aod   07/18/22 2.9  Plan: 2.5mg  MF, 5mg  aod   08/18/22 INR        OBJECTIVE:     Relevant medication changes: None.          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    INR therapeutic on present warfarin maintenance dose.  Appropriate to continue present warfarin maintenance dose for now in pt without any bleeding concerns or events.      PLAN:   Continue warfarin 2.5mg  MF, 5mg  all other days (30mg /wk)  Return in 4 weeks (on 08/18/2022). INR @ [facility].  verbally expressed understanding of the above plan.        05/30/22, PharmD , CACP          This visit is being conducted over the telephone at the patient's request: Yes  Patient gives verbal consent to proceed  and knows there may be a copay/deductible: Yes    Time spent with patient/guardian on this telephone visit: 10 minutes        Provider location : Off-site location (home, non- location)

## 2022-08-18 ENCOUNTER — Ambulatory Visit (HOSPITAL_BASED_OUTPATIENT_CLINIC_OR_DEPARTMENT_OTHER): Payer: Medicare Other

## 2022-08-21 ENCOUNTER — Ambulatory Visit (HOSPITAL_BASED_OUTPATIENT_CLINIC_OR_DEPARTMENT_OTHER): Payer: Medicare Other | Admitting: Cardiology Med

## 2022-08-21 ENCOUNTER — Ambulatory Visit (HOSPITAL_COMMUNITY): Payer: Medicare Other

## 2022-08-21 DIAGNOSIS — Z952 Presence of prosthetic heart valve: Secondary | ICD-10-CM

## 2022-08-21 DIAGNOSIS — I495 Sick sinus syndrome: Secondary | ICD-10-CM

## 2022-08-21 DIAGNOSIS — Q212 Atrioventricular septal defect, unspecified as to partial or complete: Secondary | ICD-10-CM

## 2022-08-22 ENCOUNTER — Other Ambulatory Visit (HOSPITAL_BASED_OUTPATIENT_CLINIC_OR_DEPARTMENT_OTHER): Payer: Self-pay | Admitting: Pharmacist

## 2022-08-22 DIAGNOSIS — Z952 Presence of prosthetic heart valve: Secondary | ICD-10-CM

## 2022-08-23 ENCOUNTER — Telehealth (HOSPITAL_BASED_OUTPATIENT_CLINIC_OR_DEPARTMENT_OTHER): Payer: Medicare Other | Admitting: Pharmacist

## 2022-08-23 DIAGNOSIS — Z952 Presence of prosthetic heart valve: Secondary | ICD-10-CM

## 2022-08-23 LAB — PROTHROMBIN TIME: prothrombin INR: 2.2

## 2022-08-23 NOTE — Telephone Encounter (Signed)
ANTICOAGULATION TREATMENT PLAN     Indication: MVR; recurrent CVA  Goal INR: 2.5-3.5  Duration of Therapy: chronic  Referral renewal: 06/11/2022     Hemorrhagic Risk Score: 1  Warfarin Tablet Size: 5mg      Relevant Historic Information: CVA 2008; 2005; 2006     Referring Provider: 04-05-1976     02/08/16: Pacemaker generator change INR goal ~ 2.5 with no heparin bridge.  3/18: stopped smoking ~12/13/16  Aug 2018: started smoking again  11/11/18:discharge from service due to noncompliance with f/u  06/13/21: re-establish care  07/04/22: denies tobacco use      SUBJECTIVE:    Walter Rice was last evaluated by [facility] on 07/21/22, with INR = 2.9, and instructed to continue warfarin  2.5mg  MF, 5mg  other days (30mg /wk), and follow up in 1 month.    Visit type: RETURN Phone Visit  An interpreter was not needed for the visit.  Conducted the visit with: patient, Walter Rice    Missed doses: believes he recently missed one warfarin dose    Hemorrhagic Sx: no issues/complaints  CVA, TIA, headache: no issues/complaints  Thrombotic Sx: N/A  Acute illness/changes in health: none  Diet/Appetite: regularly eats some green vegetables during the week  Alcohol use: not assessed  Tobacco use: does not use tobacco products  Activity level: unchanged since last ACC visit  Upcoming procedures: none  Other: N/A  Relevant med changes (RX, OTC, supplements): none    OBJECTIVE / LABS:  DATE INR Warfarin Dose   03/07/22 2.4 7.5mg  MF, 5mg  AOD   04/18/22 4.1 Hold x1, 7.5mg  MF, 5mg  AOD   05/02/22 3.9 Hold x1, 7.5mg  MF, 5mg  AOD   05/16/22 5.9 PLAN: hold   05/17/22   hold   05/18/22   2.5mg    05/19/22   5mg    05/20/22   5mg    05/21/22   5mg    05/22/22   5mg    05/23/22 4.9 hold x2 days, then 2.5mg  Su (instead of Mon)/F, 5mg     05/28/22 3.2 2.5mg  MF, 5mg  aod    06/04/22 3.1 2.5mg  MF, 5mg  aod    06/12/22 No show 2.5mg  MF, 5mg  aod    07/03/22 2.2 Missed dose 9/28  Plan: 7.5mg  9/29, 2.5mg  MF, 5mg  aod   07/18/22 2.9 2.5mg  MF, 5mg  aod  Missed 1 dose    08/22/22 2.2 PLAN: 7.5mg  11/18, 2.5mg  MF, 5mg  aod   09/19/22 INR        ASSESSMENT:  INR subtherapeutic likely d/t missed warfarin dose. Will give a loading dose today.    PLAN:  Warfarin 7.5mg  today,  then resume 2.5mg  MF, 5mg  other days (30mg /wk).  Return in 27 days (on 09/19/2022).  Patient expressed understanding of this plan.       This visit is being conducted over the telephone at the patient's request: Yes  Patient gives verbal consent to proceed and knows there may be a copay/deductible: Yes  Provider location : Off-site location (home, non-[place] location)  Time spent with patient/guardian on this telephone visit: 10 minutes  Given the importance of social distancing and other strategies recommended to reduce the risk of COVID-19 transmission, I am providing medical care to this patient via a telephone visit in place of an in person visit at the request of the patient.    05/30/22, PharmD

## 2022-09-17 ENCOUNTER — Other Ambulatory Visit (HOSPITAL_BASED_OUTPATIENT_CLINIC_OR_DEPARTMENT_OTHER): Payer: Self-pay | Admitting: Cardiovascular Disease

## 2022-09-19 ENCOUNTER — Ambulatory Visit (HOSPITAL_BASED_OUTPATIENT_CLINIC_OR_DEPARTMENT_OTHER): Payer: Medicare Other

## 2022-09-19 LAB — CARDIAC DEVICE CHECK - REMOTE
AT Burden Percent: 0.4
Atrial Pacing Percent: 94
Battery Remaining Longevity: 62
Battery Voltage: 2.76
RA Impedance: 268
RA Threshold Amplitude: 1.25
RV Impedance: 781
RV Pacing Percent: 9
RV Threshold Amplitude: 0.75

## 2022-09-20 ENCOUNTER — Other Ambulatory Visit (HOSPITAL_BASED_OUTPATIENT_CLINIC_OR_DEPARTMENT_OTHER): Payer: Self-pay | Admitting: Pharmacist

## 2022-09-20 DIAGNOSIS — Z7901 Long term (current) use of anticoagulants: Secondary | ICD-10-CM

## 2022-09-20 LAB — PROTHROMBIN TIME, EXTERNAL
Prothrombin INR, External: 1.9 — ABNORMAL HIGH
Prothrombin Time Patient, External: 18.9 — ABNORMAL HIGH

## 2022-09-21 ENCOUNTER — Telehealth (HOSPITAL_BASED_OUTPATIENT_CLINIC_OR_DEPARTMENT_OTHER): Payer: Medicare Other | Admitting: Pharmacist

## 2022-09-21 DIAGNOSIS — Z952 Presence of prosthetic heart valve: Secondary | ICD-10-CM

## 2022-09-21 NOTE — Telephone Encounter (Signed)
ANTICOAGULATION TREATMENT PLAN     Indication: MVR; recurrent CVA  Goal INR: 2.5-3.5  Duration of Therapy: chronic  Referral renewal: 06/11/2022     Hemorrhagic Risk Score: 1  Warfarin Tablet Size: 5mg      Relevant Historic Information: CVA 2008; 2005; 2006     Referring Provider: 04-05-1976     02/08/16: Pacemaker generator change INR goal ~ 2.5 with no heparin bridge.  3/18: stopped smoking ~12/13/16  Aug 2018: started smoking again  11/11/18:discharge from service due to noncompliance with f/u  06/13/21: re-establish care  07/04/22: denies tobacco use      SUBJECTIVE:    Walter Rice was last evaluated by [facility] on 08/23/22, with INR = 2.2, and instructed to take warfarin 7.5mg  x1 dose,   then resume 2.5mg  MF, 5mg  other days (30mg /wk), and follow up in 1 month.    Visit type: RETURN Phone Visit  An interpreter was not needed for the visit.  Conducted the visit with: patient, Walter Rice    Missed doses: no missed/extra warfarin doses or other errors, had a late dose x1 at 1:00am    Hemorrhagic Sx: no issues/complaints  CVA, TIA, headache: no issues/complaints  Thrombotic Sx: N/A  Acute illness/changes in health: none  Diet/Appetite: has been eating more greens lately, and wants to continue; states he was eating 1-2 servings per week, but had had at least 5 in the last 1-2 weeks  Alcohol use: not assessed  Tobacco use: does not use tobacco products  Activity level: unchanged since last ACC visit  Upcoming procedures: none  Other: N/A  Relevant med changes (RX, OTC, supplements): none    OBJECTIVE / LABS:  DATE INR Warfarin Dose   03/07/22 2.4 7.5mg  MF, 5mg  AOD   04/18/22 4.1 Hold x1, 7.5mg  MF, 5mg  AOD   05/02/22 3.9 Hold x1, 7.5mg  MF, 5mg  AOD   05/16/22 5.9 PLAN: hold   05/17/22   hold   05/18/22   2.5mg    05/19/22   5mg    05/20/22   5mg    05/21/22   5mg    05/22/22   5mg    05/23/22 4.9 hold x2 days, then 2.5mg  Su (instead of Mon)/F, 5mg     05/28/22 3.2 2.5mg  MF, 5mg  aod    06/04/22 3.1 2.5mg  MF, 5mg  aod     06/12/22 No show 2.5mg  MF, 5mg  aod    07/03/22 2.2 Missed dose 9/28  Plan: 7.5mg  9/29, 2.5mg  MF, 5mg  aod   07/18/22 2.9 2.5mg  MF, 5mg  aod  Missed 1 dose   08/22/22 2.2 7.5mg  11/18, 2.5mg  MF, 5mg  aod   09/19/22 1.9 PLAN: 10mg  12/17, 5mg  daily   10/01/22 INR        ASSESSMENT:  INR subtherapeutic due to increased dietary greens. His INR has been low most of the last few months. Will have him take a more aggressive loading dose today, then increase his dose.    PLAN:  Warfarin 10mg  today,  then increase to 5mg  daily (35mg /wk).  Return in 10 days (on 10/01/2022).  Patient expressed understanding of this plan.       This visit is being conducted over the telephone at the patient's request: Yes  Patient gives verbal consent to proceed and knows there may be a copay/deductible: Yes  Provider location : Off-site location (home, non-[place] location)  Time spent with patient/guardian on this telephone visit: 10 minutes  Given the importance of social distancing and other strategies recommended to reduce the risk of COVID-19  transmission, I am providing medical care to this patient via a telephone visit in place of an in person visit at the request of the patient.    Pauline Aus, PharmD

## 2022-09-22 MED ORDER — WARFARIN SODIUM 5 MG OR TABS
ORAL_TABLET | ORAL | 0 refills | Status: DC
Start: 2022-09-22 — End: 2022-12-18

## 2022-10-01 ENCOUNTER — Ambulatory Visit (HOSPITAL_BASED_OUTPATIENT_CLINIC_OR_DEPARTMENT_OTHER): Payer: Medicare Other

## 2022-10-01 ENCOUNTER — Ambulatory Visit: Payer: Medicare Other | Attending: Cardiovascular Disease

## 2022-10-01 DIAGNOSIS — Z950 Presence of cardiac pacemaker: Secondary | ICD-10-CM | POA: Insufficient documentation

## 2022-10-01 DIAGNOSIS — I495 Sick sinus syndrome: Secondary | ICD-10-CM | POA: Insufficient documentation

## 2022-10-10 LAB — PROTHROMBIN TIME: Prothrombin INR, External: 2.2

## 2022-10-13 ENCOUNTER — Other Ambulatory Visit (HOSPITAL_BASED_OUTPATIENT_CLINIC_OR_DEPARTMENT_OTHER): Payer: Self-pay | Admitting: Pharmacist

## 2022-10-13 ENCOUNTER — Ambulatory Visit: Payer: Medicare Other | Admitting: Pharmacist

## 2022-10-13 DIAGNOSIS — Z952 Presence of prosthetic heart valve: Secondary | ICD-10-CM

## 2022-10-13 NOTE — Telephone Encounter (Signed)
ANTICOAGULATION TREATMENT PLAN     Indication: MVR; recurrent CVA  Goal INR: 2.5-3.5  Duration of Therapy: chronic  Referral renewal: 06/11/2022     Hemorrhagic Risk Score: 1  Warfarin Tablet Size: 5mg      Relevant Historic Information: CVA 2008; 2005; 2006    Referring Provider: 04-05-1976     02/08/16: Pacemaker generator change INR goal ~ 2.5 with no heparin bridge.  3/18: stopped smoking ~12/13/16  Aug 2018: started smoking again  11/11/18:discharge from service due to noncompliance with f/u  06/13/21: re-establish care  07/04/22: denies tobacco use    SUBJECTIVE:  Walter Rice was last evaluated by [facility] on 09/21/22. His INR was 1.9 (goal: 2.5-3.5) on 09/19/22 and he was instructed to increase the warfarin dose (see below).    I spoke to [place] for today's assessment:    S/sx of bleeding/bruising: denies   Signs/sxs of stroke/TIA or DVT/PE:  denies  Acute changes in health: denies   Diet/Appetite: No changes. He does not like green vegetables but enjoys a salad when his sister picks one up for him. Has a salad ~1-2 x per week.  Alcohol use: Reports 5-6 shots on New Years Eve. States that he only drinks on special occasions, but then mentions "a couple of [employer]ine[employer]'s yesterday".    Tobacco/marijuana use: None.  Activity level: Takes long walks with his dog when not raining    UPCOMING PROCEDURES: None reported.    RELEVANT MEDICATION/OTC/SUPPLEMENT CHANGES: None since last ACC visit.    WARFARIN DOSE:   Current dose: 5mg  daily.  No warfarin dosing errors reported.    OBJECTIVE / LABS:  DATE INR Warfarin Dose   03/07/22 2.4 7.5mg  MF, 5mg  AOD   04/18/22 4.1 Hold x1, 7.5mg  MF, 5mg  AOD   05/02/22 3.9 Hold x1, 7.5mg  MF, 5mg  AOD   05/16/22 5.9 PLAN: hold   05/17/22   hold   05/18/22   2.5mg    05/19/22   5mg    05/20/22   5mg    05/21/22   5mg    05/22/22   5mg    05/23/22 4.9 hold x2 days, then 2.5mg  Su (instead of Mon)/F, 5mg     05/28/22 3.2 2.5mg  MF, 5mg  aod    06/04/22 3.1 2.5mg  MF, 5mg  aod    06/12/22 No show 2.5mg  MF, 5mg  aod     07/03/22 2.2 Missed dose 9/28  Plan: 7.5mg  9/29, 2.5mg  MF, 5mg  aod   07/18/22 2.9 2.5mg  MF, 5mg  aod  Missed 1 dose   08/22/22 2.2 7.5mg  11/18, 2.5mg  MF, 5mg  aod   09/19/22 1.9 10mg  12/17, 5mg  daily   10/01/22 missed 5mg  daily    10/10/22 2.2 PLAN: 7.5mg  M, 5mg  aod   10/27/22 INR       ASSESSMENT:   Walter Rice is anticoagulated for MVR/recurrent CVA with warfarin for goal range of 2.5-3.5. INR is subtherapeutic without any warfarin-related complications.    INR remains below goal despite increase in warfarin maintenance dose made at last visit. Unclear etiology for subtherapeutic INR as pt denies changes to daily habits and denies missed warfarin doses or dosing errors.  Will increase the weekly warfarin dose and monitor closely until more stable.     PLAN:   1.  Increase warfarin dose to 7.5mg  Mondays, 5mg  all others (37.5mg /week).   2.  Return in 2 weeks (on 10/27/2022).  3.  Instructed to monitor for all s/sx of bleeding/bruising or thromboembolism and seek medical evaluation if necessary.  4.  10/28  verbally acknowledged understanding of this plan. Sent plan via [place].    [name], PharmD  10/13/22 11:34 AM    This visit is being conducted over the telephone at the patient's request: Yes  Patient gives verbal consent to proceed and knows there may be a copay/deductible: Yes  Time spent with patient/guardian on this telephone visit: 10 minutes  Provider location: Off-site location (home, non-[place] location)

## 2022-10-27 ENCOUNTER — Telehealth (HOSPITAL_BASED_OUTPATIENT_CLINIC_OR_DEPARTMENT_OTHER): Payer: Medicare Other

## 2022-10-28 ENCOUNTER — Ambulatory Visit (HOSPITAL_BASED_OUTPATIENT_CLINIC_OR_DEPARTMENT_OTHER): Payer: Medicare Other

## 2022-10-28 LAB — PROTHROMBIN TIME: Prothrombin INR, External: 2.7

## 2022-10-29 ENCOUNTER — Ambulatory Visit: Payer: Medicare Other | Admitting: Pharmacist

## 2022-10-29 ENCOUNTER — Other Ambulatory Visit (HOSPITAL_BASED_OUTPATIENT_CLINIC_OR_DEPARTMENT_OTHER): Payer: Self-pay | Admitting: Pharmacist

## 2022-10-29 DIAGNOSIS — Z952 Presence of prosthetic heart valve: Secondary | ICD-10-CM

## 2022-10-29 NOTE — Telephone Encounter (Signed)
ANTICOAGULATION TREATMENT PLAN     Indication: MVR; recurrent CVA  Goal INR: 2.5-3.5  Duration of Therapy: chronic  Referral renewal: 06/11/2022     Hemorrhagic Risk Score: 1  Warfarin Tablet Size: 5mg      Relevant Historic Information: CVA 2008; 2005; 2006     Referring Provider: Aggie Cosier     02/08/16: Pacemaker generator change INR goal ~ 2.5 with no heparin bridge.  3/18: stopped smoking ~12/13/16  Aug 2018: started smoking again  11/11/18:discharge from service due to noncompliance with f/u  06/13/21: re-establish care  07/04/22: denies tobacco use      SUBJECTIVE:    Walter Rice was last evaluated by [facility] on 10/13/22, with INR = 2.2, and instructed to increase warfarin from 35mg /wk to  7.5mg  M, 5mg  other days (37.5mg /wk), and follow up in 2 weeks.    Visit type: RETURN Phone Visit  An interpreter was not needed for the visit.  Conducted the visit with: patient, Walter Rice    Missed doses: no missed/extra warfarin doses or other errors    Hemorrhagic Sx: no issues/complaints  CVA, TIA, headache: no issues/complaints  Thrombotic Sx: N/A  Acute illness/changes in health: none  Diet/Appetite: eating greens ~1-2 time(s) per WEEK  Alcohol use: occasional/rare use, none since last ACC visit  Tobacco use: does not use tobacco products  Activity level: unchanged since last ACC visit  Upcoming procedures: none  Other: N/A  Relevant med changes (RX, OTC, supplements): none    OBJECTIVE / LABS:  DATE INR Warfarin Dose   03/07/22 2.4 7.5mg  MF, 5mg  AOD   04/18/22 4.1 Hold x1, 7.5mg  MF, 5mg  AOD   05/02/22 3.9 Hold x1, 7.5mg  MF, 5mg  AOD   05/16/22 5.9 PLAN: hold   05/17/22   hold   05/18/22   2.5mg    05/19/22   5mg    05/20/22   5mg    05/21/22   5mg    05/22/22   5mg    05/23/22 4.9 hold x2 days, then 2.5mg  Su (instead of Mon)/F, 5mg     05/28/22 3.2 2.5mg  MF, 5mg  aod    06/04/22 3.1 2.5mg  MF, 5mg  aod    06/12/22 No show 2.5mg  MF, 5mg  aod    07/03/22 2.2 Missed dose 9/28  7.5mg  9/29, 2.5mg  MF, 5mg  aod   07/18/22 2.9 2.5mg  MF,  5mg  aod  Missed 1 dose   08/22/22 2.2 7.5mg  11/18, 2.5mg  MF, 5mg  aod   09/19/22 1.9 10mg  12/17, 5mg  daily   10/01/22 missed 5mg  daily    10/10/22 2.2 7.5mg  M, 5mg  aod   10/28/22 2.7 PLAN: 7.5mg  M, 5mg  aod   11/20/22 INR        ASSESSMENT:  INR therapeutic after warfarin dose increase. Continue current dosing.    PLAN:  Continue warfarin  7.5mg  M, 5mg  other days (37.5mg /wk).  Return in 22 days (on 11/20/2022).  Patient expressed understanding of this plan.       This visit is being conducted over the telephone at the patient's request: Yes  Patient gives verbal consent to proceed and knows there may be a copay/deductible: Yes  Provider location : On-site location (clinic, hospital, on-site office)  Time spent with patient/guardian on this telephone visit: 10 minutes  Given the importance of social distancing and other strategies recommended to reduce the risk of COVID-19 transmission, I am providing medical care to this patient via a telephone visit in place of an in person visit at the request of the patient.  Pauline Aus, PharmD

## 2022-11-20 ENCOUNTER — Ambulatory Visit (HOSPITAL_BASED_OUTPATIENT_CLINIC_OR_DEPARTMENT_OTHER): Payer: Medicare Other

## 2022-11-27 ENCOUNTER — Encounter (HOSPITAL_BASED_OUTPATIENT_CLINIC_OR_DEPARTMENT_OTHER): Payer: Self-pay

## 2022-11-27 NOTE — Progress Notes (Signed)
Walter Rice is 1 week overdue for lab testing related to anticoagulant therapy.  Left reminder message by telephone.

## 2022-12-04 LAB — PROTHROMBIN TIME: Prothrombin INR, External: 3.9

## 2022-12-05 ENCOUNTER — Other Ambulatory Visit (HOSPITAL_BASED_OUTPATIENT_CLINIC_OR_DEPARTMENT_OTHER): Payer: Self-pay | Admitting: Pharmacist

## 2022-12-05 ENCOUNTER — Ambulatory Visit: Payer: Medicare Other | Admitting: Pharmacist

## 2022-12-05 DIAGNOSIS — Z952 Presence of prosthetic heart valve: Secondary | ICD-10-CM

## 2022-12-05 NOTE — Telephone Encounter (Signed)
ANTICOAGULATION TREATMENT PLAN     Indication: MVR; recurrent CVA  Goal INR: 2.5-3.5  Duration of Therapy: chronic  Referral renewal: 06/11/2022     Hemorrhagic Risk Score: 1  Warfarin Tablet Size: '5mg'$      Relevant Historic Information: CVA 2008; 2005; 2006     Referring Provider: Aggie Cosier     02/08/16: Pacemaker generator change INR goal ~ 2.5 with no heparin bridge.  3/18: stopped smoking ~12/13/16  Aug 2018: started smoking again  11/11/18:discharge from service due to noncompliance with f/u  06/13/21: re-establish care  07/04/22: denies tobacco use    SUBJECTIVE:  Walter Rice was last evaluated by [facility] on 10/29/22. His INR was 2.7 (goal: 2.5-3.5) and he was instructed to continue his warfarin maintenance dose (see below), with follow-up in 3 weeks. He is 2 weeks OVERDUE.    I spoke to Walter Rice for today's assessment:    S/sx of bleeding/bruising: denies   Signs/sxs of stroke/TIA or DVT/PE:  denies  Acute changes in health: denies   Diet/Appetite: No changes. He is consistent with 1-2 serving(s) of greens (Vit K rich foods) per week.   Alcohol use: None.   Tobacco/marijuana use: None.  Activity level: No changes since last visit.   Other: n/a    UPCOMING PROCEDURES: None reported.    RELEVANT MEDICATION/OTC/SUPPLEMENT CHANGES: None since last ACC visit.    WARFARIN DOSE:   Current dose: 7.'5mg'$  Mon, '5mg'$  all others.  No warfarin dosing errors reported.    OBJECTIVE / LABS:  DATE INR Warfarin Dose   03/07/22 2.4 7.'5mg'$  MF, '5mg'$  AOD   04/18/22 4.1 Hold x1, 7.'5mg'$  MF, '5mg'$  AOD   05/02/22 3.9 Hold x1, 7.'5mg'$  MF, '5mg'$  AOD   05/16/22 5.9 PLAN: hold   05/17/22   hold   05/18/22   2.'5mg'$    05/19/22   '5mg'$    05/20/22   '5mg'$    05/21/22   '5mg'$    05/22/22   '5mg'$    05/23/22 4.9 hold x2 days, then 2.'5mg'$  Su (instead of Mon)/F, '5mg'$     05/28/22 3.2 2.'5mg'$  MF, '5mg'$  aod    06/04/22 3.1 2.'5mg'$  MF, '5mg'$  aod    06/12/22 No show 2.'5mg'$  MF, '5mg'$  aod    07/03/22 2.2 Missed dose 9/28  7.'5mg'$  9/29, 2.'5mg'$  MF, '5mg'$  aod   07/18/22 2.9 2.'5mg'$  MF, '5mg'$  aod  Missed 1  dose   08/22/22 2.2 7.'5mg'$  11/18, 2.'5mg'$  MF, '5mg'$  aod   09/19/22 1.9 '10mg'$  12/17, '5mg'$  daily   10/01/22 missed '5mg'$  daily    10/10/22 2.2 7.'5mg'$  M, '5mg'$  aod   10/28/22 2.7 7.'5mg'$  M, '5mg'$  aod   11/20/22 missed 7.'5mg'$  M, '5mg'$  aod   12/04/22 3.9 PLAN: 2.'5mg'$  x1, then '5mg'$  daily   12/18/22 INR          ASSESSMENT:   Walter Rice requires anticoagulation with warfarin for INR goal range of 2.5-3.5. INR is supratherapeutic without any warfarin-related complications.    INR has been steadily increasing on current warfarin dose.  Since there are no changes to general health and habits to explain rise in INR,  would recommend lowering the warfarin maintenance dose    PLAN:   1.  Take warfarin 2.'5mg'$  x1, then decrease warfarin '5mg'$  daily ('35mg'$ /week)  2.  Return in 13 days (on 12/18/2022).  3.  Instructed to monitor for all s/sx of bleeding/bruising or thromboembolism and seek medical evaluation if necessary.  Drew verbally acknowledged understanding of this plan.  [name], PharmD  12/05/22 11:06 AM    This visit is being conducted over the telephone at the patient's request: Yes  Patient gives verbal consent to proceed and knows there may be a copay/deductible: Yes  Time spent with patient/guardian on this telephone visit: 8 minutes  Provider location: Off-site location (home, non-[place] location)

## 2022-12-17 ENCOUNTER — Other Ambulatory Visit (HOSPITAL_BASED_OUTPATIENT_CLINIC_OR_DEPARTMENT_OTHER): Payer: Self-pay | Admitting: Pharmacist

## 2022-12-17 ENCOUNTER — Other Ambulatory Visit (HOSPITAL_BASED_OUTPATIENT_CLINIC_OR_DEPARTMENT_OTHER): Payer: Self-pay | Admitting: Cardiovascular Disease

## 2022-12-17 DIAGNOSIS — Z7901 Long term (current) use of anticoagulants: Secondary | ICD-10-CM

## 2022-12-18 ENCOUNTER — Ambulatory Visit (HOSPITAL_BASED_OUTPATIENT_CLINIC_OR_DEPARTMENT_OTHER): Payer: Medicare Other

## 2022-12-18 LAB — CARDIAC DEVICE CHECK - REMOTE
AT Burden Percent: 0.4
Atrial Pacing Percent: 93
Battery Remaining Longevity: 60
Battery Voltage: 2.76
RA Impedance: 281
RA Threshold Amplitude: 1.25
RV Impedance: 802
RV Pacing Percent: 9
RV Threshold Amplitude: 0.875

## 2022-12-18 MED ORDER — WARFARIN SODIUM 5 MG OR TABS
ORAL_TABLET | ORAL | 0 refills | Status: DC
Start: 2022-12-18 — End: 2023-03-23

## 2022-12-19 ENCOUNTER — Encounter (HOSPITAL_BASED_OUTPATIENT_CLINIC_OR_DEPARTMENT_OTHER): Payer: Self-pay | Admitting: Clinical Cardiac Electrophysiology

## 2022-12-19 NOTE — Progress Notes (Signed)
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     VHR events reviewed over the year and increasing burden of binned events, 1355 VHR since his last remote 09/18/23, 1676 between 9/23-12/23, 345 6/23-9/23,183 between 3/23-6/23.   Patient is known lead noise per plan previously 1.6.23.     Dr Luberta Robertson aware and plan for in office follow up    Alice Rieger RN

## 2022-12-22 ENCOUNTER — Telehealth (INDEPENDENT_AMBULATORY_CARE_PROVIDER_SITE_OTHER): Payer: Self-pay | Admitting: Clinical Cardiac Electrophysiology

## 2022-12-22 NOTE — Telephone Encounter (Addendum)
Per Rumina addendum to original staff message, this was supposed to be routed to [facility] (which she has done with original staff message) Closing encounter.    1st attempt: LVM to schedule New EP patient appt per Dr Arthur Holms notes below.    Alice Rieger, RN  Tarpey, Apolonio Schneiders, RN; P [facility]  [place],    Per Dr Luberta Robertson, request, please schedule in next few months.    Thanks    Rumina          Previous Messages       ----- Message -----  From: Baltazar Najjar, MD  Sent: 12/19/2022   8:29 AM PDT  To: Georgianne Fick, RN; Alice Rieger, RN; *  Subject: RE: [employer] over-read                          Ok - thanks. Good to know it was previously noted. Fine to defer MDT check and let's get him scheduled with EP this Spring please. 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  Looks like he binned 1355 VHR since his last remote 09/18/23, 1676 between 9/23-12/23, 345 6/23-9/23,183 between 3/23-6/23. So the number of VHR has been increasing over the year- appear non physiologic when you are able to see the EGM and not just marker channel    I can call medtronic later today as well if still required.    Thanks    Rumina    ----- Message -----  From: Baltazar Najjar, MD  Sent: 12/18/2022   5:09 PM PDT  To: Georgianne Fick, RN; *  Subject: [employer] over-read                              Can we have [employer] review what looks like non-physiological intervals on the V lead from this most recent remote.    Also been some time since we've seen Olden, so would be good to schedule him for EP f/u either with me or EP APP this Spring.    He fortunately  does not use the V lead much at all.    Jennette Kettle  ----- Message -----  From: Tammi Klippel, MD  Sent: 12/18/2022   2:20 PM PDT  To: Baltazar Najjar, MD    Hi - I don't love the little I can see on this RV lead - he doesn't use it much, but it might be worth running by MDT tech support for the short VV intervals. On page 26, 32, and 33 you can see the actual EGM and the oversensed signal. At first I thought it was all during sleep hours and wondered about a cpap or something, but that's not really concordant with the episode list times.    Gaylyn Rong

## 2022-12-25 LAB — PROTHROMBIN TIME: Prothrombin INR, External: 1.9

## 2022-12-26 ENCOUNTER — Ambulatory Visit: Payer: Medicare Other | Admitting: Pharmacist

## 2022-12-26 ENCOUNTER — Other Ambulatory Visit (HOSPITAL_BASED_OUTPATIENT_CLINIC_OR_DEPARTMENT_OTHER): Payer: Self-pay | Admitting: Pharmacist

## 2022-12-26 DIAGNOSIS — Z952 Presence of prosthetic heart valve: Secondary | ICD-10-CM

## 2022-12-26 NOTE — Telephone Encounter (Signed)
ANTICOAGULATION TREATMENT PLAN  Indication: MVR; recurrent CVA  Goal INR: 2.5-3.5  Duration of Therapy: chronic  Referral renewal: 06/11/2022     Hemorrhagic Risk Score: 1  Warfarin Tablet Size: 5mg      Relevant Historic Information: CVA 2008; 2005; 2006     Referring Provider: Nadene Rubins     02/08/16: Pacemaker generator change INR goal ~ 2.5 with no heparin bridge.  3/18: stopped smoking ~12/13/16  Aug 2018: started smoking again  11/11/18:discharge from service due to noncompliance with f/u  06/13/21: re-establish care  07/04/22: denies tobacco use    SUBJECTIVE:   Walter Rice was last evaluated by [facility] on 12/05/22. His INR was 3.9 (from 01/02/23) and he was instructed to take warfarin 2.5mg  x 1, then decrease dose to 5mg  daily (35mg /wk) from 37.5mg /wk with follow-up in 13 days (12/18/22). Patient is 1 week overdue.    Today, I spoke to Mr. Mayall by phone, and he reports:    S/sx of bleeding/bruising: none  S/sx of CVA/TIA (HA, visual changes, numbness/tingling, etc): none  Acute illness/changes in health: none  Diet/Appetite: eating well and having 1 serving of greens per week (had asparagus a few days ago)  EtOH: no change- consumes socially- had 1 drink on Monday  Tobacco use: no change- currently vaping (denies any tobacco use)  Activity level: no change since last ACC visit- mostly walks  Upcoming procedures: none  Other: currently does not have health insurance- unsure if he can make the cardiology appointment next month- may need to reschedule    RELEVANT MEDICATION/OTC/SUPPLEMENT CHANGES: none    OBJECTIVE:   Last ACC visit: Instructed to take warfarin 2.5mg  x 1, then 5mg  daily (35mg /wk)     Current  dose:   Patient TOOK instructed warfarin dose without errors    DATE INR Warfarin Dose   03/07/22 2.4 7.5mg  MF, 5mg  AOD   04/18/22 4.1 Hold x1, 7.5mg  MF, 5mg  AOD   05/02/22 3.9 Hold x1, 7.5mg  MF, 5mg  AOD   05/16/22 5.9 hold   05/17/22   hold   05/18/22   2.5mg    05/19/22   5mg    05/20/22   5mg    05/21/22   5mg    05/22/22    5mg    05/23/22 4.9 hold x2 days, then 2.5mg  Su (instead of Mon)/F, 5mg     05/28/22 3.2 2.5mg  MF, 5mg  aod    06/04/22 3.1 2.5mg  MF, 5mg  aod    06/12/22 No show 2.5mg  MF, 5mg  aod    07/03/22 2.2 Missed dose 9/28  7.5mg  9/29, 2.5mg  MF, 5mg  aod   07/18/22 2.9 2.5mg  MF, 5mg  aod  Missed 1 dose   08/22/22 2.2 7.5mg  11/18, 2.5mg  MF, 5mg  aod   09/19/22 1.9 10mg  12/17, 5mg  daily   10/01/22 missed 5mg  daily    10/10/22 2.2 7.5mg  M, 5mg  aod   10/28/22 2.7 7.5mg  M, 5mg  aod   11/20/22 missed 7.5mg  M, 5mg  aod   12/04/22 3.9 2.5mg  x1, then 5mg  daily   12/25/22 1.9 PLAN: 7.5mg  Fri, 5mg  aod    01/14/23 INR      ASSESSMENT:   Byrant Chairs is anticoagulated for MVR; recurrent CVA with warfarin for goal range of 2.5-3.5. INR (from 12/25/22) is subtherapeutic followed by dose decrease without any warfarin-related complications. Patient denies any missed doses. Will recommend dose increase back to 37.5mg /wk. Discussed importance of staying consistent with vitamin K intake and avoiding NSAID use today.     PLAN:   Increase warfarin dose to 7.5mg   Fri, 5mg  all other days (37.5mg /week)  Return in 19 days (on 01/14/2023).  Instructed to monitor for all s/sx of bleeding/bruising or thromboembolism and seek medical evaluation if necessary.  [name] verbally acknowledged understanding of this plan    Theophilus Kinds, PharmD  12/26/22 9:49 AM       This visit is being conducted over the telephone at the patient's request: Yes  Patient gives verbal consent to proceed and knows there may be a copay/deductible: Yes  Time spent with patient/guardian on this telephone visit: 3 minutes  Provider location: On-site location (clinic, hospital, on-site office)

## 2022-12-31 ENCOUNTER — Ambulatory Visit (HOSPITAL_BASED_OUTPATIENT_CLINIC_OR_DEPARTMENT_OTHER): Payer: Medicare Other

## 2022-12-31 ENCOUNTER — Ambulatory Visit: Payer: Medicare Other | Attending: Cardiovascular Disease

## 2022-12-31 DIAGNOSIS — I495 Sick sinus syndrome: Secondary | ICD-10-CM | POA: Insufficient documentation

## 2022-12-31 DIAGNOSIS — Z950 Presence of cardiac pacemaker: Secondary | ICD-10-CM

## 2023-01-14 ENCOUNTER — Other Ambulatory Visit (HOSPITAL_BASED_OUTPATIENT_CLINIC_OR_DEPARTMENT_OTHER): Payer: Self-pay

## 2023-01-14 ENCOUNTER — Ambulatory Visit
Admit: 2023-01-14 | Discharge: 2023-01-14 | Disposition: A | Discharge status: Home / Self Care | Payer: Medicare Other | Attending: Nurse Practitioner | Admitting: Nurse Practitioner

## 2023-01-14 ENCOUNTER — Ambulatory Visit (HOSPITAL_BASED_OUTPATIENT_CLINIC_OR_DEPARTMENT_OTHER): Payer: Medicare Other

## 2023-01-14 ENCOUNTER — Ambulatory Visit (HOSPITAL_BASED_OUTPATIENT_CLINIC_OR_DEPARTMENT_OTHER): Payer: Medicare Other | Admitting: Cardiology Med

## 2023-01-14 DIAGNOSIS — I495 Sick sinus syndrome: Secondary | ICD-10-CM

## 2023-01-14 DIAGNOSIS — Z952 Presence of prosthetic heart valve: Secondary | ICD-10-CM

## 2023-01-14 DIAGNOSIS — Q212 Atrioventricular septal defect, unspecified as to partial or complete: Secondary | ICD-10-CM

## 2023-01-14 NOTE — Progress Notes (Deleted)
[facility]    Name:  Walter Rice  Date of Birth: [birth year]  Medical Record Number: [id number]  Primary Care Physician:  No primary care provider on file.  Referring Provider:  No ref. provider found    Chief Complaint/Reason for Visit  Walter Rice is a 37 year old English-speaking male, who presents for follow up.    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 1999.  2008 gen change.  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     A) Status post repair with ventricular septal defect closure in February 1989.   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 Jude prosthetic valve in 1993.        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  According to interrogation by Dr. Tomasa Blase in March, 2021, there is no underlying P wave. Capture thresholds were acceptable.            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  TX FROM [facility]      Headache [R51.9]      TX  FROM [facility]      Mitral valve replaced [Z95.2] 04/13/2013    Chronic anticoagulation [Z79.01] 02/07/2013     [facility] ENROLLED  11/11/18: discharged from service for noncompliance with f/u  06/13/21: re-enrolled         Interval History:  Walter Rice was last seen in this clinic on 06/13/2021, when he re-established care after moving back from [place]. At that visit, he was doing well clinically and noted being "strong as a horse". His most recent remote interrogation from 12/2022 demonstrates ~5 year remaining battery life, 9% atrial pacing. His next visit with EP is on 04/03/2023.    The patient presents today with ***    The patient describes that the most strenuous activity that he does on a regular basis is ***. He {DOES/DOES NOT:100426::"does not"} think his exercise tolerance has changed ***. He {denies/reports:113653} episodes of chest discomfort. He {denies/reports:113653} orthopnea, PND, or lower extremity swelling. He {denies/reports:113653} decreased appetite, early satiety, or abdominal swelling. The patient {denies/reports:113653} palpitations. He {denies/reports:113653} orthostatic symptoms upon standing.         Medications  Outpatient Medications Prior to Visit   Medication Sig Dispense Refill    Acetaminophen 500 MG Oral Tab Take 2 tablet by mouth every 8 hours if needed to relieve pain 60 Tab prn    warfarin 5 MG tablet  take 1 tablet by mouth daily or as directed BY [facility] 90 tablet 0     No facility-administered medications prior to visit.       Cardiac Diagnostics  TTE 01/14/2023  Formal read pending.    TTE  Normal left ventricular size and function.  EF 68%.       Normally functioning mechanical mitral prosthesis, with mean gradient 4 mmHg  at HR 45 BPM and no surrogate evidence of significant regurgitation.     Normal right ventricular size and function.     Moderate right sided AV valve regurgitation.     Normal estimated pulmonary artery and right atrial pressures.     The prior  studies are unavailable for comparison.    Physical Exam:  There were no vitals taken for this visit.  General: No acute distress  HEENT: Anicteric sclera, EOMI   Neck:  Soft, non tender. Trachea is midline.  JVP is ***  Chest:  Clear to auscultation without rales or wheezes  Cardiovascular: Normal rate, regular rhythm. Normal S1, S2, no murmurs, rubs, or gallops.  Abdomen: Soft, nontender, nondistended.  Extremities: No cynanosis or clubbing. *** No lower extremity edema.  Neurologic: Alert, oriented times 3, no focal deficits  Skin: Warm and dry    Laboratory Results:   WBC   Date Value Ref Range Status   02/08/2016 7.66 4.3 - 10.0 10*3/uL Final   01/16/2015 8.87 4.3 - 10.0 10*3/uL Final   12/12/2014 7.13 4.3 - 10.0 THOU/uL Final   01/22/2012 9.05 4.3 - 10.0 THOU/uL      WBC, External   Date Value Ref Range Status   08/02/2018 7.4  Final     Creatinine   Date Value Ref Range Status   02/08/2016 1.06 0.51 - 1.18 mg/dL Final   29/56/2130 8.65 0.51 - 1.18 mg/dL Final   78/46/9629 5.28 0.51 - 1.18 mg/dL Final     Creatinine, External   Date Value Ref Range Status   08/02/2018 1.1  Final     B_Type Natriuretic Peptide   Date Value Ref Range Status   11/08/2013 21 <101 pg/mL Final     Wt Readings from Last 5 Encounters:   06/13/21 81.2 kg (179 lb)   08/03/18 73 kg (161 lb)   07/21/16 79.3 kg (174 lb 12.8 oz)   01/24/16 78.5 kg (173 lb)   07/17/15 77.6 kg (171 lb)     BP Readings from Last 5 Encounters:   06/13/21 118/71   07/21/16 117/62   01/24/16 116/73   07/17/15 105/70   01/11/15 106/67     No results found for this or any previous visit.  No results found for: "A1C"    ASSESSMENT  Walter Rice is a 37 year old male seen in Cardiology clinic for follow up.    He should use prophylactic antibiotics at indicated times for the prevention of bacterial endocarditis given his indication of a prosthetic heart valve.    ACHD AP class: II-C      PLAN  Return to clinic in 2 years with echocardiogram      Patient  seen and discussed with adult congenital heart disease attending, Dr. Nadene Rubins.    Author:  Sullivan Lone, MD  Cardiology Fellow

## 2023-01-23 LAB — PROTHROMBIN TIME: Prothrombin INR, External: 3.3

## 2023-01-26 ENCOUNTER — Other Ambulatory Visit (HOSPITAL_BASED_OUTPATIENT_CLINIC_OR_DEPARTMENT_OTHER): Payer: Self-pay | Admitting: Pharmacist

## 2023-01-26 ENCOUNTER — Ambulatory Visit: Payer: Medicare Other | Admitting: Pharmacist

## 2023-01-26 DIAGNOSIS — Z952 Presence of prosthetic heart valve: Secondary | ICD-10-CM

## 2023-01-26 NOTE — Telephone Encounter (Signed)
ANTICOAGULATION TREATMENT PLAN     Indication: MVR; recurrent CVA  Goal INR: 2.5-3.5  Duration of Therapy: chronic  Referral renewal: 06/11/2022     Hemorrhagic Risk Score: 1  Warfarin Tablet Size:      Relevant Historic Information: CVA 2008; 2005; 2006     Referring Provider: Nadene Rubins     02/08/16: Pacemaker generator change INR goal ~ 2.5 with no heparin bridge.  3/18: stopped smoking ~12/13/16  Aug 2018: started smoking again  11/11/18:discharge from service due to noncompliance with f/u  06/13/21: re-establish care  07/04/22: denies tobacco use      SUBJECTIVE:    Walter Rice was last evaluated by [facility] on 12/26/22, with INR = 1.9, and instructed to increase warfarin from /wk to  7.5mg  F,  other days (37.5mg /wk), and follow up in 3 weeks.    Visit type: RETURN Phone Visit  An interpreter was not needed for the visit.  Conducted the visit with: patient, Walter Rice      Missed doses: no missed/extra warfarin doses or other errors    Hemorrhagic Sx: no issues/complaints  CVA, TIA, headache: no issues/complaints  Thrombotic Sx: N/A  Acute illness/changes in health: none  Diet/Appetite: eating greens 1-2 time(s) per WEEK  Alcohol use: occasional/rare use  Tobacco use: does not use tobacco products  Activity level: unchanged since last ACC visit  Upcoming procedures: none  Other: N/A  Relevant med changes (RX, OTC, supplements): none    OBJECTIVE / LABS:  DATE INR Warfarin Dose   03/07/22 2.4 7.5mg  MF,  AOD   04/18/22 4.1 Hold x1, 7.5mg  MF,  AOD   05/02/22 3.9 Hold x1, 7.5mg  MF,  AOD   05/16/22 5.9 hold   05/17/22   hold   05/18/22   2.5mg    05/19/22      05/20/22      05/21/22      05/22/22      05/23/22 4.9 hold x2, 2.5mg  Su (instead of Mon)/F,     05/28/22 3.2 2.5mg  MF,  aod    06/04/22 3.1 2.5mg  MF,  aod    06/12/22 No show 2.5mg  MF,  aod    07/03/22 2.2 Missed dose 9/28  7.5mg  9/29, 2.5mg  MF,  aod   07/18/22 2.9 2.5mg  MF,  aod  Missed 1 dose   08/22/22 2.2  7.5mg  11/18, 2.5mg  MF,  aod   09/19/22 1.9  12/17,  daily   10/01/22 missed  daily    10/10/22 2.2 7.5mg  M,  aod   10/28/22 2.7 7.5mg  M,  aod   11/20/22 missed 7.5mg  M,  aod   12/04/22 3.9 2.5mg  x1,  daily   12/25/22 1.9 7.5mg  F,  aod   01/23/23 3.3 PLAN: 7.5mg  F,  aod   02/26/23 INR        ASSESSMENT:  INR therapeutic on current warfarin dose.    PLAN:  Continue warfarin  7.5mg  F,  other days (37.5mg /wk).  Return in 31 days (on 02/26/2023).  Patient expressed understanding of this plan.       This visit is being conducted over the telephone at the patient's request: Yes  Patient gives verbal consent to proceed and knows there may be a copay/deductible: Yes  Provider location : On-site location (clinic, hospital, on-site office)  Time spent with patient/guardian on this telephone visit: 7 minutes  Given the importance of social distancing and other strategies recommended to reduce the risk of COVID-19 transmission, I am providing medical care  to this patient via a telephone visit in place of an in person visit at the request of the patient.    Pauline Aus, PharmD

## 2023-02-26 ENCOUNTER — Ambulatory Visit (HOSPITAL_BASED_OUTPATIENT_CLINIC_OR_DEPARTMENT_OTHER): Payer: Medicare Other

## 2023-03-04 ENCOUNTER — Other Ambulatory Visit (HOSPITAL_BASED_OUTPATIENT_CLINIC_OR_DEPARTMENT_OTHER): Payer: Self-pay | Admitting: Pharmacist

## 2023-03-04 DIAGNOSIS — Z952 Presence of prosthetic heart valve: Secondary | ICD-10-CM

## 2023-03-06 LAB — PROTHROMBIN TIME: Prothrombin INR, External: 4.1

## 2023-03-09 ENCOUNTER — Other Ambulatory Visit (HOSPITAL_BASED_OUTPATIENT_CLINIC_OR_DEPARTMENT_OTHER): Payer: Self-pay | Admitting: Pharmacist

## 2023-03-09 ENCOUNTER — Ambulatory Visit: Payer: Medicare Other | Admitting: Pharmacist

## 2023-03-09 DIAGNOSIS — Z952 Presence of prosthetic heart valve: Secondary | ICD-10-CM

## 2023-03-09 NOTE — Telephone Encounter (Addendum)
ANTICOAGULATION TREATMENT PLAN     Indication: MVR; recurrent CVA  Goal INR: 2.5-3.5  Duration of Therapy: chronic  Referral renewal: 06/11/2022     Hemorrhagic Risk Score: 1  Warfarin Tablet Size: 5mg      Relevant Historic Information: CVA 2008; 2005; 2006     Referring Provider: Nadene Rubins     02/08/16: Pacemaker generator change INR goal ~ 2.5 with no heparin bridge.  3/18: stopped smoking ~12/13/16  Aug 2018: started smoking again  11/11/18:discharge from service due to noncompliance with f/u  06/13/21: re-establish care  07/04/22: denies tobacco use    SUBJECTIVE:  Walter Rice was last evaluated by [facility] on 01/26/23. His INR was 3.3 (goal: 2.5-3.5) and he was instructed to continue his warfarin maintenance dose of 7.5mg  F, and 5mg  all others (see below), with follow-up in 4 weeks, on 03/06/23.    I spoke to Walter Rice for today's assessment:    S/sx of bleeding/bruising: denies   Signs/sxs of stroke/TIA or DVT/PE:  denies  Acute changes in health: denies   Diet/Appetite: No changes. He is consistent with 1-2 serving(s) of greens (Vit K rich foods) per week.   Alcohol use: None in the last week.   Tobacco/marijuana use: None.  Activity level: No changes since last visit. Walked 12 miles on one day last week.  Other: n/a    UPCOMING PROCEDURES: None reported.    RELEVANT MEDICATION/OTC/SUPPLEMENT CHANGES: None since last ACC visit.    WARFARIN DOSE:   Current dose: see below.  May have taken a higher dose in error.    OBJECTIVE / LABS:  DATE INR Warfarin Dose   03/07/22 2.4 7.5mg  MF, 5mg  AOD   04/18/22 4.1 Hold x1, 7.5mg  MF, 5mg  AOD   05/02/22 3.9 Hold x1, 7.5mg  MF, 5mg  AOD   05/16/22 5.9 hold   05/17/22   hold   05/18/22   2.5mg    05/19/22   5mg    05/20/22   5mg    05/21/22   5mg    05/22/22   5mg    05/23/22 4.9 hold x2, 2.5mg  Su (instead of Mon)/F, 5mg     05/28/22 3.2 2.5mg  MF, 5mg  aod    06/04/22 3.1 2.5mg  MF, 5mg  aod    06/12/22 No show 2.5mg  MF, 5mg  aod    07/03/22 2.2 Missed dose 9/28  7.5mg  9/29, 2.5mg  MF, 5mg   aod   07/18/22 2.9 2.5mg  MF, 5mg  aod  Missed 1 dose   08/22/22 2.2 7.5mg  11/18, 2.5mg  MF, 5mg  aod   09/19/22 1.9 10mg  12/17, 5mg  daily   10/01/22 missed 5mg  daily    10/10/22 2.2 7.5mg  M, 5mg  aod   10/28/22 2.7 7.5mg  M, 5mg  aod   11/20/22 missed 7.5mg  M, 5mg  aod   12/04/22 3.9 2.5mg  x1, 5mg  daily   12/25/22 1.9 7.5mg  F, 5mg  aod   01/23/23 3.3 7.5mg  F, 5mg  aod   02/26/23 missed 7.5mg  F, 5mg  aod   03/06/23 4.1 PLAN: HOLD x1, 7.5mg  F, 5mg  aod   03/27/23 INR              ASSESSMENT:   Walter Rice requires anticoagulation with warfarin for INR goal range of 2.5-3.5. INR is  supratherapeutic,  without any warfarin-related complications.  Unclear etiology for elevated INR although, per pt, a dosing error is possible.  Will make a 1x warfarin dose adjustment and reassess at next visit. Consider reducing the maintenance as necessary.    PLAN:   1.   HOLD  warfarin today,  then resume 7.5mg  F, 5mg  all others (37.5mg /week)  2.  Return in 18 days (on 03/27/2023).  3.  Instructed to monitor for all s/sx of bleeding/bruising or thromboembolism and seek medical evaluation if necessary.  4.  Walter Rice verbally acknowledged understanding of this plan.    Alfred Levins, PharmD  03/09/23 9:26 AM    This visit is being conducted over the telephone at the patient's request: Yes  Patient gives verbal consent to proceed and knows there may be a copay/deductible: Yes  Time spent with patient/guardian on this telephone visit: 10 minutes  Provider location: Off-site location (home, non-[place] location)

## 2023-03-19 ENCOUNTER — Other Ambulatory Visit (HOSPITAL_BASED_OUTPATIENT_CLINIC_OR_DEPARTMENT_OTHER): Payer: Self-pay | Admitting: Cardiovascular Disease

## 2023-03-20 LAB — CARDIAC DEVICE CHECK - REMOTE
AT Burden Percent: 0.3
Atrial Pacing Percent: 93
Battery Remaining Longevity: 54
Battery Voltage: 2.76
RA Impedance: 281
RA Threshold Amplitude: 1.375
RV Impedance: 465
RV Pacing Percent: 9
RV Threshold Amplitude: 0.875

## 2023-03-21 ENCOUNTER — Other Ambulatory Visit (HOSPITAL_BASED_OUTPATIENT_CLINIC_OR_DEPARTMENT_OTHER): Payer: Self-pay | Admitting: Pharmacist

## 2023-03-21 DIAGNOSIS — Z7901 Long term (current) use of anticoagulants: Secondary | ICD-10-CM

## 2023-03-23 MED ORDER — WARFARIN SODIUM 5 MG OR TABS
ORAL_TABLET | ORAL | 0 refills | Status: DC
Start: 2023-03-23 — End: 2023-04-06

## 2023-03-26 ENCOUNTER — Ambulatory Visit (HOSPITAL_BASED_OUTPATIENT_CLINIC_OR_DEPARTMENT_OTHER): Payer: Medicare Other | Admitting: Cardiology Med

## 2023-03-26 ENCOUNTER — Ambulatory Visit (HOSPITAL_COMMUNITY): Payer: Medicare Other

## 2023-03-27 ENCOUNTER — Telehealth (HOSPITAL_BASED_OUTPATIENT_CLINIC_OR_DEPARTMENT_OTHER): Payer: Self-pay | Admitting: Cardiovascular Disease

## 2023-03-27 ENCOUNTER — Ambulatory Visit (HOSPITAL_BASED_OUTPATIENT_CLINIC_OR_DEPARTMENT_OTHER): Payer: Medicare Other

## 2023-03-27 NOTE — Telephone Encounter (Signed)
Pt last seen 06/2021 by DrValentina Lucks, plan for annual f/u with echo.    Pt cx OV 01/14/23.

## 2023-04-01 ENCOUNTER — Ambulatory Visit: Payer: Medicare Other | Attending: Cardiovascular Disease

## 2023-04-01 ENCOUNTER — Ambulatory Visit (HOSPITAL_BASED_OUTPATIENT_CLINIC_OR_DEPARTMENT_OTHER): Payer: Medicare Other

## 2023-04-01 DIAGNOSIS — I495 Sick sinus syndrome: Secondary | ICD-10-CM

## 2023-04-01 DIAGNOSIS — Z950 Presence of cardiac pacemaker: Secondary | ICD-10-CM

## 2023-04-01 NOTE — Progress Notes (Deleted)
Walter Rice    [id number]  03-26-1986    Attending: Cammie Sickle, MD  Referring Provider: No referring provider defined for this encounter.      ELECTROPHYSIOLOGY CLINIC NOTE    CHIEF COMPLAINT: Pacemaker follow up      HISTORY OF PRESENT ILLNESS:    Walter Rice is a very pleasant 37 year old male with history of CVA, AVSD s/p repair w/MVR and sinus node dysfunction  status post Medtronic dual chamber pacemaker in 1999 with generator replacement in 2017 with hx of low impedence on atrial lead.      Was last seen in clinic 06/13/2021 to establish care after moving from [facility].  Has been noted to have chronically low atrial lead impedence.  At time of generator change in 2017 there was conversations about extraction or insertion of a new TV system from the right side (given left venous occlusion).  IN the end the lead had acceptable unipolar pacing parameters and only the generator was replaced.      Last remote check was in ***. Walter Rice {.:[id number]} had symptoms of syncope or presyncope.  Walter Rice denies acute weight changes, lower extremity edema, orthopnea, or PND.  The patient is able to climb {dmt stairs:113048} flights of stairs with minimal dyspnea. The patient denies chest pain, or awareness of fast or slow heart rates or palpitations. Blood pressures at home have been running normal.      Walter Rice presents today for routine follow up for device evaluation.      Outpatient Medications Prior to Visit   Medication Sig Dispense Refill    Acetaminophen 500 MG Oral Tab Take 2 tablet by mouth every 8 hours if needed to relieve pain 60 Tab prn    warfarin 5 MG tablet Take 1 and 1/2 tablet(s) every FRI (7.5 mg), and 1 tablet(s) all other days (5 mg), OR as directed by [facility]. 15 tablet 0     No facility-administered medications prior to visit.       I have reviewed the patient's medical history, social history and allergies in detail and updated the computerized patient record.       DATA    Wt Readings from  Last 4 Encounters:   06/13/21 81.2 kg (179 lb)   08/03/18 73 kg (161 lb)   07/21/16 79.3 kg (174 lb 12.8 oz)   01/24/16 78.5 kg (173 lb)       DEVICE ANALYSIS  Reviewed by [place] today. Full report in device progress note.       01/11/2015    10:00 AM 07/18/2015     4:00 PM 01/24/2016    12:00 PM 06/19/2016     9:00 AM 06/17/2021     8:00 AM 10/02/2021    12:00 PM 10/11/2021     5:00 PM   CIED   Visit Type   Clinic Remote Remote Remote Clinic   Recall   No No No No No   Pacing Mode AAIR<=>DDDR 60-180 DDDR/AAIR 60-180 VVI 65 DDDR/AAIR 60-180 AAIR=DDDR 60 - 180 AAIR=DDDR 60 - 180 AAIR=DDDR 60 - 180   Type PPM PPM PPM PPM PPM PPM PPM   Pacemaker Dependent   No No No No No   Manufacturer Medtronic Medtronic Medtronic Medtronic Medtronic Medtronic Medtronic   Elective Replacement Indicator 18 months 2.59 ERI    ERI   Generator Model Adapta L Adapta L Adapta L Adapta L Adapta L Adapta L ADDRL1 Adapta L ADDRL1   Date of Generator Implant  10/28/2006 10/28/2006 10/28/2006 10/28/2006 02/08/2016 02/08/2016 02/08/2016   Generator Serial Number [id number]   Lead #1 Type Atrial Atrial Atrial Atrial Atrial Atrial Atrial   Model # [id number] 4068 4068 4068 [id number] 4068   Lead #1 Serial # [id number] V [id number] V [id number] V [id number] V [id number] V [id number] V   Lead #1 Implant Date 11/17/1997 11/17/1997 11/17/1997 11/17/1997 11/17/1997 11/17/1997 11/17/1997   Lead #2 Type RV RV RV RV RV RV RV   Lead #2 Model # [id number] [id number] 5054 5054 [id number]   Lead #2 Serial # [id number] V [id number] V [id number] V [id number] V [id number] V [id number] V [id number] V   Lead #2 Implant Date 11/17/1997 11/17/1997 11/17/1997 11/17/1997 11/17/1997 11/17/1997 11/17/1997   Lead # 4 Serial #  remote        Date of Service  07/18/2015  06/19/2016 06/13/2021 10/02/2021 10/11/2021   Battery Voltage 2.73 2.71V/<3 months 2.68 2.79V / 10.5 Years 2.77V   7 Yrs 2.78V/6 yrs 2.77V/6 yrs   P-Wave 0     NR NR   R-Wave 15.6-22.4 11.2 0 11.2 22.4 NR 8   Atrial  impedence 264 232  291 286 277 274   RV impedence 1558 1608 1272 1584 412 789 868   Atrial Threshold 1.25V@.76 2.5@0 .4  1.25@0 .4 1.25@0 .4 1.125V@0 .4 1.25 @ 0.4   RV Threshold 1V@0 .4 1.125@0 .4 .75V@1 .0 1.125@0 .4 1.0@0 .4 0.875V@0 .4 1.25 @ 0.4   Atrial Events 0 0  None None 0    Ventricular Events 6 0 25 VHR 2 NS None 0 binned at [facility]   % atrial pacing 99.6 99.2  99 99 100 99   % ventricular pacing 0 1.4 5.7 2 0 3 3   Underlying Rhythm  n/a  NA No P @ 40 bpm NA Junctional rhythm           IMPRESSION AND PLAN  Walter Rice is 36 year old year old male status post {dmt PM 2:113051} pacemaker.      1.  Device Assessment:  The device demonstrates appropriate pacing and sensing function.  Battery status, capture thresholds, and lead impedances are all stable and testing within normal limits. {dmt pacing:113047} pacing ***% of the time.  We did not make any changes to the device today.      There are no Patient Instructions on file for this visit.    Medications prescribed today:  ***  Medications discontinued today:  ***    Follow up:   The patient is enrolled in home monitoring. I Plan to see patient back in clinic in {dmt months:113052} months for device evaluation. We will monitor remote transmissions in between clinic visits. Walter Rice was instructed to call with any change in symptoms including palpitations, fast/slow heart rates, chest pain, dizziness, lightheadedness, fatigue, lower extremity edema, shortness of breath, dyspnea on exertion, pre-syncope and syncope.    Walter Katz, PA-C  Division of Cardiology, Electrophysiology  [facility]      CC: No primary care provider on file.  CC: No referring provider defined for this encounter.      I spent a total of *** minutes for the patient's care on the date of the service.  {Vanishing Tip  When performed by provider on date of visit, these count toward total time  Chart review   History taking  Physical exam  Counseling, educating patient/family/caregiver  Orders    Referrals and communication with other providers (not separately reported)  Documentation  Care coordination (not separately reported)  Independent interpretation of results (not separately reported) and  communicating results to patient/family/caregiver  :999}

## 2023-04-03 ENCOUNTER — Ambulatory Visit (HOSPITAL_BASED_OUTPATIENT_CLINIC_OR_DEPARTMENT_OTHER): Payer: Medicare Other

## 2023-04-03 LAB — PROTHROMBIN TIME: Prothrombin INR, External: 2.8

## 2023-04-03 NOTE — Telephone Encounter (Signed)
Walter Rice, Walter Rice, PSS/PSR  P [facility]  Caller: Unspecified (1 week ago,  5:12 PM)    Spoke with pt and r/s to 10/3 along with an echo

## 2023-04-06 ENCOUNTER — Ambulatory Visit: Payer: Medicare Other | Admitting: Pharmacist

## 2023-04-06 ENCOUNTER — Other Ambulatory Visit (HOSPITAL_BASED_OUTPATIENT_CLINIC_OR_DEPARTMENT_OTHER): Payer: Self-pay | Admitting: Pharmacist

## 2023-04-06 DIAGNOSIS — Z7901 Long term (current) use of anticoagulants: Secondary | ICD-10-CM

## 2023-04-06 DIAGNOSIS — Z952 Presence of prosthetic heart valve: Secondary | ICD-10-CM

## 2023-04-06 MED ORDER — WARFARIN SODIUM 5 MG OR TABS
ORAL_TABLET | ORAL | 1 refills | Status: DC
Start: 2023-04-06 — End: 2023-07-06

## 2023-04-06 NOTE — Telephone Encounter (Signed)
ANTICOAGULATION TREATMENT PLAN     Indication: MVR; recurrent CVA  Goal INR: 2.5-3.5  Duration of Therapy: chronic  Referral renewal: 06/11/2022     Hemorrhagic Risk Score: 1  Warfarin Tablet Size: 5mg      Relevant Historic Information: CVA 2008; 2005; 2006     Referring Provider: Nadene Rubins     02/08/16: Pacemaker generator change INR goal ~ 2.5 with no heparin bridge.  3/18: stopped smoking ~12/13/16  Aug 2018: started smoking again  11/11/18:discharge from service due to noncompliance with f/u  06/13/21: re-establish care  07/04/22: denies tobacco use    SUBJECTIVE:  Walter Rice was last evaluated by [facility] on 03/09/23. His INR was 4.1 (goal: 2.5-3.5) and he was instructed to hold warfarin x 1, then resume usual dose of 7.5mg  F, 5mg  all others (37.5mg /week) (see below), with follow-up in 18 days.    I spoke to Walter Rice for today's assessment:    S/sx of bleeding/bruising: denies  Signs/sxs of stroke/TIA or DVT/PE:  denies  Acute changes in health: denies   Diet/Appetite: No changes. He is consistent with 1-2 serving(s) of greens (Vit K rich foods) per week.   Alcohol use: None.   Tobacco/marijuana use: None.  Activity level:  Walking his dog, 1-1.5 hours per day    UPCOMING PROCEDURES: None reported.    RELEVANT MEDICATION/OTC/SUPPLEMENT CHANGES: None since last [facility] visit.  Takes Tylenol as needed for headaches- not new    WARFARIN DOSE:   Current dose: Held dose on 6/3 when INR=4.1, then resumed 7.5mg  F, 5mg  all others (37.5mg /week).  No warfarin dosing errors reported.    OBJECTIVE / LABS:  DATE INR Warfarin Dose   03/07/22 2.4 7.5mg  MF, 5mg  AOD   04/18/22 4.1 Hold x1, 7.5mg  MF, 5mg  AOD   05/02/22 3.9 Hold x1, 7.5mg  MF, 5mg  AOD   05/16/22 5.9 hold   05/17/22   hold   05/18/22   2.5mg    05/19/22   5mg    05/20/22   5mg    05/21/22   5mg    05/22/22   5mg    05/23/22 4.9 hold x2, 2.5mg  Su (instead of Mon)/F, 5mg     05/28/22 3.2 2.5mg  MF, 5mg  aod    06/04/22 3.1 2.5mg  MF, 5mg  aod    06/12/22 No show 2.5mg  MF, 5mg  aod     07/03/22 2.2 Missed dose 9/28  7.5mg  9/29, 2.5mg  MF, 5mg  aod   07/18/22 2.9 2.5mg  MF, 5mg  aod  Missed 1 dose   08/22/22 2.2 7.5mg  11/18, 2.5mg  MF, 5mg  aod   09/19/22 1.9 10mg  12/17, 5mg  daily   10/01/22 missed 5mg  daily    10/10/22 2.2 7.5mg  M, 5mg  aod   10/28/22 2.7 7.5mg  M, 5mg  aod   11/20/22 missed 7.5mg  M, 5mg  aod   12/04/22 3.9 2.5mg  x1, 5mg  daily   12/25/22 1.9 7.5mg  F, 5mg  aod   01/23/23 3.3 7.5mg  F, 5mg  aod   02/26/23 missed 7.5mg  F, 5mg  aod   03/06/23 4.1 HOLD x1, 7.5mg  F, 5mg  aod   04/03/23 2.8 PLAN: 7.5mg  F, 5mg  all others    05/01/23 INR          ASSESSMENT:   Walter Rice requires anticoagulation with warfarin for INR goal range of 2.5-3.5. INR is therapeutic without any warfarin-related complications.    PLAN:   1.  Continue warfarin dose of 7.5mg  F, 5mg  all others (37.5mg /week). Rx renewal sent to [facility] in [place].  2.  Return in 25 days (on 05/01/2023).  3.  Instructed to monitor for all s/sx of bleeding/bruising or thromboembolism and seek medical evaluation if necessary.  4.  Walter Rice verbally acknowledged understanding of this plan.    Alfred Levins, PharmD  04/06/23 11:51 AM    This visit is being conducted over the telephone at the patient's request: Yes  Patient gives verbal consent to proceed and knows there may be a copay/deductible: Yes  Time spent with patient/guardian on this telephone visit: 7 minutes  Provider location: Off-site location (home, non-[place] location)

## 2023-05-01 ENCOUNTER — Ambulatory Visit (HOSPITAL_BASED_OUTPATIENT_CLINIC_OR_DEPARTMENT_OTHER): Payer: Medicare Other

## 2023-05-06 ENCOUNTER — Encounter (HOSPITAL_BASED_OUTPATIENT_CLINIC_OR_DEPARTMENT_OTHER): Payer: Self-pay

## 2023-05-06 NOTE — Progress Notes (Signed)
Walter Rice is 3 days overdue for lab testing related to anticoagulant therapy. Left reminder message by telephone and mailed reminder letter today.

## 2023-05-12 LAB — PROTHROMBIN TIME: Prothrombin INR, External: 4.9

## 2023-05-13 ENCOUNTER — Other Ambulatory Visit (HOSPITAL_BASED_OUTPATIENT_CLINIC_OR_DEPARTMENT_OTHER): Payer: Self-pay | Admitting: Pharmacist

## 2023-05-13 ENCOUNTER — Ambulatory Visit: Payer: Medicare Other | Admitting: Pharmacist

## 2023-05-13 DIAGNOSIS — Z952 Presence of prosthetic heart valve: Secondary | ICD-10-CM

## 2023-05-13 NOTE — Telephone Encounter (Signed)
ANTICOAGULATION TREATMENT PLAN     Indication: MVR; recurrent CVA  Goal INR: 2.5-3.5  Duration of Therapy: chronic  Referral renewal: 06/11/2022     Hemorrhagic Risk Score: 1  Warfarin Tablet Size: 5mg      Relevant Historic Information: CVA 2008; 2005; 2006     Referring Provider: Nadene Rubins     02/08/16: Pacemaker generator change INR goal ~ 2.5 with no heparin bridge.  3/18: stopped smoking ~12/13/16  Aug 2018: started smoking again  11/11/18:discharge from service due to noncompliance with f/u  06/13/21: re-establish care  07/04/22: denies tobacco use    SUBJECTIVE:   Walter Rice was last evaluated by [facility] on 04/06/23 regarding his therapeutic INR.     Pt was advised to continue on current dose of warfarin .  He was rescheduled for next INR in 25 days.  He is overdue for INR and ACC f/u since 05/01/23    I spoke to pt about his INR result from yesterday/    Signs of bleeding/bruising: none reported   Signs/sxs of stroke/TIA : none reported  Acute changes in health: none reported  Diet: consistent with 1-2 serving of greens per week.  Appetite: no changes reported   Activity level: denies changes, continues to walk his dog daily  Alcohol use: reports consuming 3 beers on 05/01/23 and 1 EtOH drink on 05/02/23 while he was camping.   Tobacco use: denies use  Other: none       Upcoming procedures: none       RELEVANT MEDICATION/OTC/SUPPLEMENT CHANGES: none.  Takes APAP occasionally for HA (last time he took APAP was on 05/08/23)    OBJECTIVE:   Present warfarin dose: Warfarin dose as below.  Pt has 5mg  warfarin tablets.  No dosing error (denies missed or extra doses)     DATE INR Warfarin Dose   03/07/22 2.4 7.5mg  MF, 5mg  AOD   04/18/22 4.1 Hold x1, 7.5mg  MF, 5mg  AOD   05/02/22 3.9 Hold x1, 7.5mg  MF, 5mg  AOD   05/16/22 5.9 hold   05/17/22   hold   05/18/22   2.5mg    05/19/22   5mg    05/20/22   5mg    05/21/22   5mg    05/22/22   5mg    05/23/22 4.9 hold x2, 2.5mg  Su (instead of Mon)/F, 5mg     05/28/22 3.2 2.5mg  MF, 5mg  aod     06/04/22 3.1 2.5mg  MF, 5mg  aod    06/12/22 No show 2.5mg  MF, 5mg  aod    07/03/22 2.2 Missed dose 9/28  7.5mg  9/29, 2.5mg  MF, 5mg  aod   07/18/22 2.9 2.5mg  MF, 5mg  aod  Missed 1 dose   08/22/22 2.2 7.5mg  11/18, 2.5mg  MF, 5mg  aod   09/19/22 1.9 10mg  12/17, 5mg  daily   10/01/22 missed 5mg  daily    10/10/22 2.2 7.5mg  M, 5mg  aod   10/28/22 2.7 7.5mg  M, 5mg  aod   11/20/22 missed 7.5mg  M, 5mg  aod   12/04/22 3.9 2.5mg  x1, 5mg  daily   12/25/22 1.9 7.5mg  F, 5mg  aod   01/23/23 3.3 7.5mg  F, 5mg  aod   02/26/23 missed 7.5mg  F, 5mg  aod   03/06/23 4.1 HOLD x1, 7.5mg  F, 5mg  aod   04/03/23 2.8 7.5mg  F, 5mg  all others    05/01/23 missed 7.5mg  F, 5mg  aod   05/12/23 4.9 Plan: holdx1, then 5mg  daily   05/22/23 INR            ASSESSMENT:   Supratherapeutic INR with no apparent reason.  EtOH consumption  2 weeks ago should not affect the INR.  Pt is without any warfarin related complications.  Given pt's hx of fluctuating INRs, recommended trial of low dose vitamin K supplement to help achieve stable therapeutic INRs.  However, pt denied.  Will hold one dose and lower his weekly dose to achieve therapeutic INR.    PLAN:   1.  Hold warfarin dose today, on 05/13/2023 and then lower weekly dose to 5 mg daily   2.  Return in about 9 days (around 05/22/2023) to coincide with cardiology appt.  Pt states he will find out if he is able to keep his appt as his insurance doesn't always covers full cost and let ACC know if he needs to move his appt  3,  Advised to monitor for signs of bleeding.  4.  Pt verbalized understanding and agreed to above plan.    This visit is being conducted over the telephone at the patient's request: Yes  Patient gives verbal consent to proceed and knows there may be a copay/deductible: Yes    Time spent with patient/guardian on this telephone visit: 4 minutes      Royston Bake, PharmD MS CGP

## 2023-05-21 NOTE — Progress Notes (Deleted)
Walter Rice    [id number]  1986/09/21    Attending: Cammie Sickle, MD  Referring Provider: No referring provider defined for this encounter.      ELECTROPHYSIOLOGY CLINIC NOTE    CHIEF COMPLAINT: Pacemaker follow up      HISTORY OF PRESENT ILLNESS:    Walter Rice is a very pleasant 37 year old male with history of AVSD s/p repair w/mitral valve replacement and sinus node dysfunction  status post Medtronic dual chamber pacemaker with most recent generator replacement in 2017    He was last seen in clinic 06/13/2021 to establish care after moving back to [place] from [place].     CXR***      Last remote check was in ***. Walter Rice {.:[id number]} had symptoms of syncope or presyncope.  Walter Rice denies acute weight changes, lower extremity edema, orthopnea, or PND.  The patient is able to climb {dmt stairs:113048} flights of stairs with minimal dyspnea. The patient denies chest pain, or awareness of fast or slow heart rates or palpitations. Blood pressures at home have been running normal.      Walter Rice presents today for routine follow up for device evaluation.      Outpatient Medications Prior to Visit   Medication Sig Dispense Refill   . Acetaminophen 500 MG Oral Tab Take 2 tablet by mouth every 8 hours if needed to relieve pain 60 Tab prn   . warfarin 5 MG tablet Take 1 tablet (5 mg) by mouth daily.  or as directed by [facility]     . warfarin 5 MG tablet Take 1 and 1/2 tablet(s) every FRI (7.5 mg), and 1 tablet(s) all other days (5 mg), OR as directed by [facility]. 100 tablet 1     No facility-administered medications prior to visit.       I have reviewed the patient's medical history, social history and allergies in detail and updated the computerized patient record.       DATA    Wt Readings from Last 4 Encounters:   06/13/21 81.2 kg (179 lb)   08/03/18 73 kg (161 lb)   07/21/16 79.3 kg (174 lb 12.8 oz)   01/24/16 78.5 kg (173 lb)       DEVICE ANALYSIS  Reviewed by [place] today. Full report in device progress note.        01/11/2015    10:00 AM 07/18/2015     4:00 PM 01/24/2016    12:00 PM 06/19/2016     9:00 AM 06/17/2021     8:00 AM 10/02/2021    12:00 PM 10/11/2021     5:00 PM   CIED   Visit Type   Clinic Remote Remote Remote Clinic   Recall   No No No No No   Pacing Mode AAIR<=>DDDR 60-180 DDDR/AAIR 60-180 VVI 65 DDDR/AAIR 60-180 AAIR=DDDR 60 - 180 AAIR=DDDR 60 - 180 AAIR=DDDR 60 - 180   Type PPM PPM PPM PPM PPM PPM PPM   Pacemaker Dependent   No No No No No   Manufacturer Medtronic Medtronic Medtronic Medtronic Medtronic Medtronic Medtronic   Elective Replacement Indicator 18 months 2.59 ERI    ERI   Generator Model Adapta L Adapta L Adapta L Adapta L Adapta L Adapta L ADDRL1 Adapta L ADDRL1   Date of Generator Implant 10/28/2006 10/28/2006 10/28/2006 10/28/2006 02/08/2016 02/08/2016 02/08/2016   Generator Serial Number [id number]   Lead #1 Type Atrial Atrial Atrial Atrial Atrial Atrial Atrial  Model # [id number]   Lead #1 Serial # [id number] V [id number] V [id number] V [id number] V [id number] V [id number] V [id number] V   Lead #1 Implant Date 11/17/1997 11/17/1997 11/17/1997 11/17/1997 11/17/1997 11/17/1997 11/17/1997   Lead #2 Type RV RV RV RV RV RV RV   Lead #2 Model # [id number]   Lead #2 Serial # [id number] V [id number] V [id number] V [id number] V [id number] V [id number] V [id number] V   Lead #2 Implant Date 11/17/1997 11/17/1997 11/17/1997 11/17/1997 11/17/1997 11/17/1997 11/17/1997   Lead # 4 Serial #  remote        Date of Service  07/18/2015  06/19/2016 06/13/2021 10/02/2021 10/11/2021   Battery Voltage 2.73 2.71V/<3 months 2.68 2.79V / 10.5 Years 2.77V   7 Yrs 2.78V/6 yrs 2.77V/6 yrs   P-Wave 0     NR NR   R-Wave 15.6-22.4 11.2 0 11.2 22.4 NR 8   Atrial impedence 264 232  291 286 277 274   RV impedence 1558 1608 1272 1584 412 789 868   Atrial Threshold 1.25V@.76 2.5@0 .4  1.25@0 .4 1.25@0 .4 1.125V@0 .4 1.25 @ 0.4   RV Threshold 1V@0 .4 1.125@0 .4 .75V@1 .0 1.125@0 .4 1.0@0 .4  0.875V@0 .4 1.25 @ 0.4   Atrial Events 0 0  None None 0    Ventricular Events 6 0 25 VHR 2 NS None 0 binned at [facility]   % atrial pacing 99.6 99.2  99 99 100 99   % ventricular pacing 0 1.4 5.7 2 0 3 3   Underlying Rhythm  n/a  NA No P @ 40 bpm NA Junctional rhythm           IMPRESSION AND PLAN  Walter Rice is 37 year old year old male status post {dmt PM 2:113051} pacemaker.      1.  Device Assessment:  The device demonstrates appropriate pacing and sensing function.  Battery status, capture thresholds, and lead impedances are all stable and testing within normal limits. {dmt pacing:113047} pacing ***% of the time.  We did not make any changes to the device today.      Known low impedance on atrial lad***    Medications prescribed today:  ***  Medications discontinued today:  ***    Follow up:   The patient is enrolled in home monitoring. I Plan to see patient back in clinic in {dmt months:113052} months for device evaluation. We will monitor remote transmissions in between clinic visits. Walter Rice was instructed to call with any change in symptoms including palpitations, fast/slow heart rates, chest pain, dizziness, lightheadedness, fatigue, lower extremity edema, shortness of breath, dyspnea on exertion, pre-syncope and syncope.    Walter Katz, PA-C  Division of Cardiology, Electrophysiology  [facility]      CC: No primary care provider on file.  CC: No referring provider defined for this encounter.      I spent a total of *** minutes for the patient's care on the date of the service.  {Vanishing Tip  When performed by provider on date of visit, these count toward total time  Chart review   History taking  Physical exam  Counseling, educating patient/family/caregiver  Orders   Referrals and communication with other providers (not separately reported)  Documentation  Care coordination (not separately reported)  Independent interpretation of results (not separately reported) and communicating results to  patient/family/caregiver  :999}      Zola did not cancel and was not present for a scheduled appointment today.  Disposition: {no show:102593}

## 2023-05-22 ENCOUNTER — Ambulatory Visit (HOSPITAL_BASED_OUTPATIENT_CLINIC_OR_DEPARTMENT_OTHER): Payer: Medicare Other

## 2023-05-22 ENCOUNTER — Encounter (HOSPITAL_BASED_OUTPATIENT_CLINIC_OR_DEPARTMENT_OTHER): Payer: Self-pay

## 2023-05-22 ENCOUNTER — Ambulatory Visit: Payer: Medicare Other

## 2023-05-22 NOTE — Progress Notes (Signed)
Patient no-showed today's appointment; provider notified for review of record.

## 2023-05-27 ENCOUNTER — Other Ambulatory Visit (HOSPITAL_BASED_OUTPATIENT_CLINIC_OR_DEPARTMENT_OTHER): Payer: Self-pay | Admitting: Pharmacist

## 2023-05-27 DIAGNOSIS — Z952 Presence of prosthetic heart valve: Secondary | ICD-10-CM

## 2023-05-29 LAB — PROTHROMBIN TIME: Prothrombin INR, External: 3

## 2023-06-01 ENCOUNTER — Ambulatory Visit: Payer: Medicare Other | Attending: Cardiovascular Disease | Admitting: Pharmacist

## 2023-06-01 ENCOUNTER — Other Ambulatory Visit (HOSPITAL_BASED_OUTPATIENT_CLINIC_OR_DEPARTMENT_OTHER): Payer: Self-pay | Admitting: Pharmacist

## 2023-06-01 DIAGNOSIS — Z952 Presence of prosthetic heart valve: Secondary | ICD-10-CM

## 2023-06-01 NOTE — Telephone Encounter (Signed)
ANTICOAGULATION TREATMENT PLAN     Indication: MVR; recurrent CVA  Goal INR: 2.5-3.5  Duration of Therapy: chronic  Referral renewal: 05/27/23     Hemorrhagic Risk Score: 1  Warfarin Tablet Size: 5mg      Relevant Historic Information: CVA 2008; 2005; 2006     Referring Provider: Nadene Rubins     02/08/16: Pacemaker generator change INR goal ~ 2.5 with no heparin bridge.  3/18: stopped smoking ~12/13/16  Aug 2018: started smoking again  11/11/18:discharge from service due to noncompliance with f/u  06/13/21: re-establish care  07/04/22: denies tobacco use    SUBJECTIVE:  Torien Emmett was last evaluated by [facility] on 05/13/23. His INR was 4.9 (goal: 2.5-3.5) on 05/12/23 and he was instructed to hold warfarin x 1 dose, then decrease warfarin dose from 37.5mg /week to 5mg  daily (35mg /wk) (see below), with follow-up in 9 days, on 05/22/23. Pt is OVERDUE for INR check.    I spoke to [name] for today's assessment:    S/sx of bleeding/bruising: denies   Signs/sxs of stroke/TIA or DVT/PE:  denies  Acute changes in health: denies   Diet/Appetite: No changes. He is consistent with 1-2 serving(s) of greens (Vit K rich foods) per week.   Alcohol use: Denies alcohol. He only drinks on special occasions or when camping.   Tobacco/marijuana use: None.  Activity level: No changes since last visit.  Other: n.a    UPCOMING PROCEDURES: None reported.    RELEVANT MEDICATION/OTC/SUPPLEMENT CHANGES: None since last ACC visit.    WARFARIN DOSE:   Current dose: 5mg  daily.  No warfarin dosing errors reported.    DATE INR Warfarin Dose   03/07/22 2.4 7.5mg  MF, 5mg  AOD   04/18/22 4.1 Hold x1, 7.5mg  MF, 5mg  AOD   05/02/22 3.9 Hold x1, 7.5mg  MF, 5mg  AOD   05/16/22 5.9 hold   05/17/22   hold   05/18/22   2.5mg    05/19/22   5mg    05/20/22   5mg    05/21/22   5mg    05/22/22   5mg    05/23/22 4.9 hold x2, 2.5mg  Su (instead of Mon)/F, 5mg     05/28/22 3.2 2.5mg  MF, 5mg  aod    06/04/22 3.1 2.5mg  MF, 5mg  aod    06/12/22 No show 2.5mg  MF, 5mg  aod    07/03/22 2.2  Missed dose 9/28  7.5mg  9/29, 2.5mg  MF, 5mg  aod   07/18/22 2.9 2.5mg  MF, 5mg  aod  Missed 1 dose   08/22/22 2.2 7.5mg  11/18, 2.5mg  MF, 5mg  aod   09/19/22 1.9 10mg  12/17, 5mg  daily   10/01/22 missed 5mg  daily    10/10/22 2.2 7.5mg  M, 5mg  aod   10/28/22 2.7 7.5mg  M, 5mg  aod   11/20/22 missed 7.5mg  M, 5mg  aod   12/04/22 3.9 2.5mg  x1, 5mg  daily   12/25/22 1.9 7.5mg  F, 5mg  aod   01/23/23 3.3 7.5mg  F, 5mg  aod   02/26/23 missed 7.5mg  F, 5mg  aod   03/06/23 4.1 HOLD x1, 7.5mg  F, 5mg  aod   04/03/23 2.8 7.5mg  F, 5mg  all others    05/01/23 missed 7.5mg  F, 5mg  aod   05/12/23 4.9 holdx1, then 5mg  daily   05/22/23 missed 5mg  daily   05/29/23 3.0 PLAN: 5mg  daily   06/26/23 INR      ASSESSMENT:   Deigo Kinsman requires anticoagulation with warfarin for INR goal range of 2.5-3.5. INR is therapeutic. Pt denies any unusual bruising or bleeding, and denies signs/sxs of thrombus or thromboembolism.     Therapeutic INR  following warfarin dose adjustment made about 3 weeks ago. Warfarin dosage has likely reached steady state. Vit K supplement was discussed at last visit to help stabilize the INR. Jatniel is not interested at this time.    PLAN:   1.  Continue warfarin 5mg  daily (35mg /week)  2.  Return in 25 days (on 06/26/2023).  3.  Instructed to monitor for all s/sx of bleeding/bruising or thromboembolism and seek medical evaluation if necessary.  4.  [name] verbally acknowledged understanding of this plan.    Alfred Levins, PharmD  06/01/23 9:53 AM    This visit is being conducted over the telephone at the patient's request: Yes  Patient gives verbal consent to proceed and knows there may be a copay/deductible: Yes  Time spent with patient/guardian on this telephone visit: 7 minutes  Provider location: On-site location (clinic, hospital, on-site office)

## 2023-06-18 ENCOUNTER — Other Ambulatory Visit (HOSPITAL_BASED_OUTPATIENT_CLINIC_OR_DEPARTMENT_OTHER): Payer: Self-pay | Admitting: Cardiovascular Disease

## 2023-06-19 ENCOUNTER — Telehealth (HOSPITAL_BASED_OUTPATIENT_CLINIC_OR_DEPARTMENT_OTHER): Payer: Self-pay | Admitting: Clinical Cardiac Electrophysiology

## 2023-06-19 ENCOUNTER — Telehealth (HOSPITAL_BASED_OUTPATIENT_CLINIC_OR_DEPARTMENT_OTHER): Payer: Self-pay

## 2023-06-19 LAB — CARDIAC DEVICE CHECK - REMOTE
AT Burden Percent: 0.3
Atrial Pacing Percent: 92
Battery Remaining Longevity: 59
Battery Voltage: 2.77
RA Impedance: 274
RA Threshold Amplitude: 1
RV Impedance: 792
RV Pacing Percent: 9
RV Threshold Amplitude: 1

## 2023-06-19 NOTE — Telephone Encounter (Signed)
1st attempt, I called and LVM to have PT to call me back to schedule appt with EP APP for device check.          Thank you,     (La) Bryna Colander  [facility] Electrophysiology  [address] [place], [place] [address]  [phone]

## 2023-06-19 NOTE — Progress Notes (Signed)
 [facility]  [address]  [place] [address]  Phone: [phone]  Fax: [phone]  Fax results to Medical Records Fax: [phone]         Patient: [place] [facility]    Date of Birth: 12-31-1985   Birth Sex: Male   Current Gender: Male   Gender Identity: Male   Date: 07/24/2022 07:51 AM   Historian: self   Visit Type: Office Visit   Interpreter: Language:English           Completed Orders (This Visit)  Order Details Reason Side Interpretation Result Initial Treatment Date Region   Patient Health Questionnaire (PHQ-2)    Further testing is not required 0     Generalized Anxiety Disorder - 7 (GAD-7)    Mild 7     Patient Health Questionnaire (PHQ-9)     0     PC-PTSD-5 Screener    Negative 0     AUDIT    Normal 0     DAST    Normal 0       Assessment/Plan  # Detail Type Description    1. Assessment Atrial septal defect (Q21.10).    Patient Plan 2021 and Beyond Appointment Time Calculation    Physician's total time of time spent, which includes face-to-face and non-face-to-face time working for this specific patient, on the date of the patient's appointment was this number of minutes:          22    Tasks completed during that time included:  Preparing to see the patient (eg, review of tests, previous progress notes of yours or another provider)  Obtaining and/or reviewing separately obtained history  Performing a medically appropriate examination and/or evaluation  Counseling and educating the patient/family/caregiver  Ordering medications, tests, or procedures  Documenting clinical information in the electronic or other health record         2. Assessment Family history of diabetes mellitus (Z83.3).    Plan Orders follow-up visit with [facility] labs soon.         3. 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         4. Assessment Homeless (Z59.00).    Patient Plan message sent to social work to try to help him find a home    follow up soon with Walter Louder, PsyD, as scheduled         5. Assessment Anticoagulant long-term use (Z79.01).         6. Assessment Sinus node dysfunction (I49.5).         7. Assessment History of mitral valve replacement (Z95.2).         8. Assessment History of stroke (Z86.73).    Plan Orders Apolipoprotein B to be performed, Next Lab Date is on 07/26/2022, Comp. Metabolic Panel (14) to be performed on  07/26/2022, Hemoglobin A1c to be performed on  07/26/2022, Insulin to be performed on  07/26/2022, Lipid Panel to be performed on  07/26/2022 and Lipoprotein (a) to be performed on, Next Lab Date is 07/26/2022.         9. Other Orders Orders not associated to today's assessments.    Plan Orders The patient had the following order(s) completed today: Generalized Anxiety Disorder - 7 (GAD-7)Next .AUDITNext .DASTNext .Patient Health Questionnaire (PHQ-2)Next .Patient Health Questionnaire (PHQ-9).PC-PTSD-5 Screener Interpretation: Negative, Result details: 0.            This 37 year old patient presents for establish care, cardiologist, has appointment with  Dr. Dorise [facility] and family history.    History of Present Illness  1.  establish care   homeless for about a year    has [facility] to get into [facility] [place], but there's a long time    couch surfing with friends and family for the last year    he was living with his wife prior to that situation    he's separated from his wife    he has two kids:    one with ex-girlfriend:  21 year old daughter  (ex-grilfriend lives in [place] )    one with wife:  5 year son      he talks to son frequently    his wife lives in [place]         not working     not going to school    has appt scheduled with behavioral health specialist, Walter Louder, PsyD, on 07/31/2022        2.  cardiologist   primary cardiologist at [place]:    [place] [facility] M.D., [place] cardiologist    has a number of heart problems, including    atrial septal defect  h/o CVA  h/o mitral valve replacemnt with mechanical  valve  sinus node dysfunction  pacemaker    he sees cardiologist once a year    Jul 19, 2022 outside records reviewed from cardiology        3.  has appointment with Dr. Dorise Rice   this first wisit will take place next Friday at 11:30 AM        4.  family history   mother:  pacemaker, morbidly obese  (his mom was adopted)    father:  [place]    sister:  hysterectomy, diabetes          Screening Tools  Other Screenings  Encounter Date Performed Date Screening Tool Score Severity/ Interpretation MDD Classification Scanned Document   07/24/2022 07/24/2022 Patient Health Questionnaire (PHQ-2) 0 Further testing is not required     07/24/2022 07/24/2022 Generalized Anxiety Disorder - 7 (GAD-7) 7 Mild     07/24/2022 07/24/2022 Patient Health Questionnaire (PHQ-9) 0      07/24/2022 07/24/2022 PC-PTSD-5 Screener 0 Negative         Patient Health Questionnaire  Performed Date: 07/24/2022  Over the last 2 weeks, how often have you been bothered by any of the following problems?     Not at All   Several Days   More Than Half The Days   Nearly every Day     1. Little interest or pleasure in doing things X        2. Feeling down, depressed or hopeless X        3. Trouble falling or staying asleep, or sleeping too much X        4. Feeling tired or having little energy X        5. Poor appetite or overeating X        6. Feeling bad about yourself - or that you are a failure or have let yourself or your family down   X      7. Trouble concentrating on things, such as    reading the newspaper or watching                television   X      8. Moving or speaking so slowly that other    people could have noticed. Or  the opposite -   being so fidgety or restless that you have been moving around a lot more than usual.   X      9. Thoughts that you would be better off dead, or of hurting yourself in some way X           Not Difficult at All   Some What Difficult   Very Difficult   Extremely Difficult   10. If you checked off any  problems, how difficult  have these problems made it for you to do your work, take care of things at home or get along with other people?   X        Total score: Screening total score was 0.    Initial diagnosis: None.      Generalized Anxiety Disorder - 7  Over the last 2 weeks, how often have you been bothered by any of the following problems?     Not at All Several Days More than Half Days Nearly Every day   1. Feeling nervous, anxious or on edge   X      2. Not being able to stop or control worrying      X   3. Worrying too much about different things      X   4. Trouble relaxing   X      5. Being so restless that it is hard to sit still   X      6. Becoming easily annoyed or irritable    X     7. Feeling afraid as if something awful might          happen   X        Performed Date: 07/24/2022  Total score: 7  Interpretation: Mild    If you checked off any problems, how difficult have these problems made it for you to do your work, take care of things at home, or get along with other people? Not difficult at all  Intake Comments: pt is here to establish care. pt says was told by [facility] to schedule with us  for counseling sessions    other concerns: none    OTC: none      Problem List  Problem Description Onset Date Chronic Clinical Status Notes   Anticoagulant long-term use  N     Homeless  N     Family history of diabetes mellitus  N     History of mitral valve replacement  N     History of stroke  N     Sinus node dysfunction 06/13/2021 N     H/O mitral valve replacement with mechanical valve 06/11/2022 N     Mitral valve replaced 04/13/2013 N     Midline low back pain without sciatica 08/04/2014 N     Chronic anticoagulation 02/07/2013 N     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      Allergies  Ingredient Reaction (Severity) Medication Name Comment   SULFA (SULFONAMIDE ANTIBIOTICS) Rash       Reviewed, updated.        Vital Signs   Height  Time ft in cm Last Measured Height Position    2:59 PM 6 0.95 185.3 07/24/2022 0     Weight/BSA/BMI  Time lb oz kg Context BMI kg/m2 BSA m2    2:59 PM 168.2  76.294 dressed without shoes 22.22      Blood Pressure  Time BP mm/Hg Position Side Site Method Cuff Size    2:59 PM 110/69 sitting left brachial automatic adult     Temperature/Pulse/Respiration  Time Temp F Temp C Temp Site Pulse/min Pattern Resp/ min    2:59 PM    48       Measured by  Time Measured by    2:59 PM Penne MAGDALENO Burnet MA       Physical Exam  Exam Findings Details   Constitutional Normal Well developed.   Psychiatric Normal Appropriate mood and affect. Normal insight. Normal judgment.     Medications (Added, Continued or Stopped today)  Start Date Medication Directions PRN Status PRN Reason Instruction Stop Date   06/13/2011 Acetaminophen  500 MG Oral Tab Take 2 tablet by mouth every 8 hours if needed to relieve pain N      11/14/2021 warfarin 5 mg tablet Take 1-1/2 tablets (7.5mg ) on Mon/Wed/Fri and 1 tab (5mg ) all other days or as directed by [facility] N            The patient was checked out at  3:43 PM by Penne MAGDALENO Burnet MA.            Provider  Charlena HERO. Chin MD 07/25/2022 08:15 PM  Document generated by: Charlena HERO. Chin MD 07/25/2022 08:15 PM        Electronically signed by Charlena HERO. Chin MD on 07/26/2022 06:33 PM

## 2023-06-19 NOTE — Telephone Encounter (Signed)
Remote transmission report received via Murj from patient's [employer] dual chamber pacemaker.  There is an alert for  RV lead oversensing of non-physiological noise.     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  Patient was last seen in clinic by Dr. Luberta Robertson on 06/13/21.  He had an EP APP appointment on 04/03/23 to check his device and lead, but patient did not show up.      Report reviewed.  Lead and Device measured data reviewed.  Atrial lead has known low impedence, currently 274 ohms.  Other measured parameters appear generally stable.    Presenting EGM shows AP with presence of non-physiologic VS events.  This is also present on Ventricular High rate episodes, marker channels only.    There are also 13 AHR episodes in logbook. The longest lasted 15 min.  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                    Forwarding encounter to Dr. Luberta Robertson and EP RN for review and to the EP team to contact patient and reschedule him for device check in clinic.    Full report available in Careers information officer.

## 2023-06-26 ENCOUNTER — Ambulatory Visit (HOSPITAL_BASED_OUTPATIENT_CLINIC_OR_DEPARTMENT_OTHER): Payer: Medicare Other

## 2023-06-29 ENCOUNTER — Telehealth (HOSPITAL_BASED_OUTPATIENT_CLINIC_OR_DEPARTMENT_OTHER): Payer: Self-pay | Admitting: Clinical Cardiac Electrophysiology

## 2023-06-29 NOTE — Telephone Encounter (Signed)
1st attempt, I called and LVM to have PT to call me back to schedule appt with EP APP in person before the end of the year ok to use RN Holds          Thank you,     (La) Bryna Colander  [facility] Electrophysiology  [address] [place], [place] [address]  [phone]

## 2023-06-29 NOTE — Telephone Encounter (Signed)
Opened in error

## 2023-07-01 ENCOUNTER — Ambulatory Visit (HOSPITAL_BASED_OUTPATIENT_CLINIC_OR_DEPARTMENT_OTHER): Payer: Medicare Other

## 2023-07-01 ENCOUNTER — Ambulatory Visit: Payer: Medicare Other | Attending: Cardiovascular Disease

## 2023-07-01 DIAGNOSIS — I495 Sick sinus syndrome: Secondary | ICD-10-CM | POA: Insufficient documentation

## 2023-07-01 DIAGNOSIS — Z950 Presence of cardiac pacemaker: Secondary | ICD-10-CM

## 2023-07-03 LAB — PROTHROMBIN TIME: Prothrombin INR, External: 3.4

## 2023-07-06 ENCOUNTER — Other Ambulatory Visit (HOSPITAL_BASED_OUTPATIENT_CLINIC_OR_DEPARTMENT_OTHER): Payer: Self-pay | Admitting: Pharmacist

## 2023-07-06 ENCOUNTER — Ambulatory Visit: Payer: Medicare Other | Admitting: Pharmacist

## 2023-07-06 DIAGNOSIS — Z952 Presence of prosthetic heart valve: Secondary | ICD-10-CM

## 2023-07-06 MED ORDER — WARFARIN SODIUM 5 MG OR TABS
5.0000 mg | ORAL_TABLET | Freq: Every day | ORAL | 1 refills | Status: DC
Start: 2023-07-06 — End: 2023-10-12

## 2023-07-06 NOTE — Telephone Encounter (Signed)
ANTICOAGULATION TREATMENT PLAN  Indication: MVR; recurrent CVA  Goal INR: 2.5-3.5  Duration of Therapy: chronic  Referral renewal: 05/27/23     Hemorrhagic Risk Score: 1  Warfarin Tablet Size: 5mg      Relevant Historic Information: CVA 2008; 2005; 2006     Referring Provider: Nadene Rubins     02/08/16: Pacemaker generator change INR goal ~ 2.5 with no heparin bridge.  3/18: stopped smoking ~12/13/16  Aug 2018: started smoking again  11/11/18:discharge from service due to noncompliance with f/u  06/13/21: re-establish care  07/04/22: denies tobacco use    SUBJECTIVE:   [facility] was last evaluated by [facility] on 06/01/23. His INR was 3.0 and he was instructed to continue warfarin 5mg  daily (35mg /wk) with follow-up in 4 weeks (06/26/23). Patient is 1 week overdue.    Today, I spoke to [place] by phone, and he reports:    S/sx of bleeding/bruising: none  S/sx of CVA/TIA (HA, visual changes, numbness/tingling, etc): none  Acute illness/changes in health: had a cold about 2 weeks ago- fully recovered now  Diet/Appetite: no change- back to baseline- having 1-2 servings of greens per week  EtOH: no change- occasionally consumes  Tobacco use: denies use  Activity level: no change since last ACC visit  Upcoming procedures: none    RELEVANT MEDICATION/OTC/SUPPLEMENT CHANGES: none    OBJECTIVE:   Last ACC visit: Instructed to continue warfarin 5mg  daily (35mg /wk)      Current  dose:   Patient TOOK instructed warfarin dose without errors    DATE INR Warfarin Dose   03/07/22 2.4 7.5mg  MF, 5mg  AOD   04/18/22 4.1 Hold x1, 7.5mg  MF, 5mg  AOD   05/02/22 3.9 Hold x1, 7.5mg  MF, 5mg  AOD   05/16/22 5.9 hold   05/17/22   hold   05/18/22   2.5mg    05/19/22   5mg    05/20/22   5mg    05/21/22   5mg    05/22/22   5mg    05/23/22 4.9 hold x2, 2.5mg  Su (instead of Mon)/F, 5mg     05/28/22 3.2 2.5mg  MF, 5mg  aod    06/04/22 3.1 2.5mg  MF, 5mg  aod    06/12/22 No show 2.5mg  MF, 5mg  aod    07/03/22 2.2 Missed dose 9/28  7.5mg  9/29, 2.5mg  MF, 5mg  aod   07/18/22 2.9 2.5mg   MF, 5mg  aod  Missed 1 dose   08/22/22 2.2 7.5mg  11/18, 2.5mg  MF, 5mg  aod   09/19/22 1.9 10mg  12/17, 5mg  daily   10/01/22 missed 5mg  daily    10/10/22 2.2 7.5mg  M, 5mg  aod   10/28/22 2.7 7.5mg  M, 5mg  aod   11/20/22 missed 7.5mg  M, 5mg  aod   12/04/22 3.9 2.5mg  x1, 5mg  daily   12/25/22 1.9 7.5mg  F, 5mg  aod   01/23/23 3.3 7.5mg  F, 5mg  aod   02/26/23 missed 7.5mg  F, 5mg  aod   03/06/23 4.1 HOLD x1, 7.5mg  F, 5mg  aod   04/03/23 2.8 7.5mg  F, 5mg  all others    05/01/23 missed 7.5mg  F, 5mg  aod   05/12/23 4.9 holdx1, then 5mg  daily   05/22/23 missed 5mg  daily   05/29/23 3.0 5mg  daily   06/26/23 missed 5mg  daily   07/03/23 3.4 PLAN: 5mg  daily   08/07/23 INR      ASSESSMENT:   [place] 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 INR is therapeutic without any warfarin-related complications. No changes in medications, diet/alcohol or lifestyle are reported today. Will recommend continuation of current dose.  PLAN:   Continue warfarin 5mg  daily (35mg /week)  Return in 1 month (on 08/07/2023).  Instructed to monitor for all s/sx of bleeding/bruising or thromboembolism and seek medical evaluation if necessary.  [place] verbally acknowledged understanding of this plan    Theophilus Kinds, PharmD  07/06/23 2:00 PM       This visit is being conducted over the telephone at the patient's request: Yes  Patient gives verbal consent to proceed and knows there may be a copay/deductible: Yes  Time spent with patient/guardian on this telephone visit: 3 minutes  Provider location: On-site location (clinic, hospital, on-site office)

## 2023-07-09 ENCOUNTER — Ambulatory Visit: Payer: Medicare Other | Admitting: Cardiology Med

## 2023-07-09 ENCOUNTER — Ambulatory Visit
Admit: 2023-07-09 | Discharge: 2023-07-09 | Disposition: A | Discharge status: Home / Self Care | Payer: Medicare Other | Attending: Nurse Practitioner | Admitting: Nurse Practitioner

## 2023-07-09 ENCOUNTER — Other Ambulatory Visit (HOSPITAL_BASED_OUTPATIENT_CLINIC_OR_DEPARTMENT_OTHER): Payer: Medicare Other

## 2023-07-09 DIAGNOSIS — Z952 Presence of prosthetic heart valve: Secondary | ICD-10-CM

## 2023-08-07 ENCOUNTER — Ambulatory Visit: Payer: Medicare Other

## 2023-08-12 ENCOUNTER — Encounter (HOSPITAL_BASED_OUTPATIENT_CLINIC_OR_DEPARTMENT_OTHER): Payer: Self-pay

## 2023-08-12 NOTE — Progress Notes (Signed)
Walter Rice is 3 days overdue for lab testing related to anticoagulant therapy. Left reminder message by telephone and mailed reminder letter today.

## 2023-08-21 ENCOUNTER — Ambulatory Visit: Payer: Medicare Other

## 2023-08-24 ENCOUNTER — Ambulatory Visit (HOSPITAL_BASED_OUTPATIENT_CLINIC_OR_DEPARTMENT_OTHER): Payer: Medicare Other

## 2023-08-24 LAB — PROTHROMBIN TIME: Prothrombin INR, External: 4.8

## 2023-08-25 ENCOUNTER — Ambulatory Visit: Payer: Medicare Other | Admitting: Pharmacist

## 2023-08-25 ENCOUNTER — Other Ambulatory Visit (HOSPITAL_BASED_OUTPATIENT_CLINIC_OR_DEPARTMENT_OTHER): Payer: Self-pay | Admitting: Pharmacist

## 2023-08-25 DIAGNOSIS — Z5181 Encounter for therapeutic drug level monitoring: Secondary | ICD-10-CM

## 2023-08-25 DIAGNOSIS — Z952 Presence of prosthetic heart valve: Secondary | ICD-10-CM

## 2023-08-25 DIAGNOSIS — Z7901 Long term (current) use of anticoagulants: Secondary | ICD-10-CM

## 2023-08-25 NOTE — Telephone Encounter (Signed)
 ANTICOAGULATION TREATMENT PLAN  Indication: MVR; recurrent CVA  Goal INR: 2.5-3.5  Duration of Therapy: chronic  Referral renewal: 05/27/23     Hemorrhagic Risk Score: 1  Warfarin Tablet Size: 5mg      Relevant Historic Information: CVA 2008; 2005; 2006     Referring Provider: Nadene Rubins     02/08/16: Pacemaker generator change INR goal ~ 2.5 with no heparin bridge.  3/18: stopped smoking ~12/13/16  Aug 2018: started smoking again  11/11/18:discharge from service due to noncompliance with f/u  06/13/21: re-establish care  07/04/22: denies tobacco use    SUBJECTIVE:   [place] is a 37 year old male who was contacted by phone for anticoagulation monitoring.  Patient confirms taking warfarin as prescribed. He thinks he may have taken a double dose on 08/20/23 by mistake.  Patient denies ER visits and/or hospitalizations since last seen in clinic.    S/sx of bleeding/bruising: none  S/sx of CVA/TIA (HA, visual changes, numbness/tingling, etc): none  Acute illness/changes in health:  denies, however patient has been under a lot of stress.  Diet/Appetite: no change- back to baseline- having 1-2 servings of greens per week  EtOH: no change- occasionally consumes  Tobacco use: denies use  Activity level: no change since last ACC visit  Upcoming procedures: none    RELEVANT MEDICATION/OTC/SUPPLEMENT CHANGES: none    OBJECTIVE:   Last ACC visit: Instructed to continue warfarin 5mg  daily (35mg /wk)      Current  dose:   Patient TOOK instructed warfarin dose without errors    DATE INR Warfarin Dose   03/07/22 2.4 7.5mg  MF, 5mg  AOD   04/18/22 4.1 Hold x1, 7.5mg  MF, 5mg  AOD   05/02/22 3.9 Hold x1, 7.5mg  MF, 5mg  AOD   05/16/22 5.9 hold   05/17/22   hold   05/18/22   2.5mg    05/19/22   5mg    05/20/22   5mg    05/21/22   5mg    05/22/22   5mg    05/23/22 4.9 hold x2, 2.5mg  Su (instead of Mon)/F, 5mg     05/28/22 3.2 2.5mg  MF, 5mg  aod    06/04/22 3.1 2.5mg  MF, 5mg  aod    06/12/22 No show 2.5mg  MF, 5mg  aod    07/03/22 2.2 Missed dose 9/28  7.5mg   9/29, 2.5mg  MF, 5mg  aod   07/18/22 2.9 2.5mg  MF, 5mg  aod  Missed 1 dose   08/22/22 2.2 7.5mg  11/18, 2.5mg  MF, 5mg  aod   09/19/22 1.9 10mg  12/17, 5mg  daily   10/01/22 missed 5mg  daily    10/10/22 2.2 7.5mg  M, 5mg  aod   10/28/22 2.7 7.5mg  M, 5mg  aod   11/20/22 missed 7.5mg  M, 5mg  aod   12/04/22 3.9 2.5mg  x1, 5mg  daily   12/25/22 1.9 7.5mg  F, 5mg  aod   01/23/23 3.3 7.5mg  F, 5mg  aod   02/26/23 missed 7.5mg  F, 5mg  aod   03/06/23 4.1 HOLD x1, 7.5mg  F, 5mg  aod   04/03/23 2.8 7.5mg  F, 5mg  all others    05/01/23 missed 7.5mg  F, 5mg  aod   05/12/23 4.9 holdx1, then 5mg  daily   05/22/23 missed 5mg  daily   05/29/23 3.0 5mg  daily   06/26/23 missed 5mg  daily   07/03/23 3.4 5mg  daily   08/25/23 4.8 PLAN: Holdx2, then 5mg  daily   09/24/23 INR      ASSESSMENT:   INR is supratherapeutic without any warfarin-related complications. Pt thinks he may have doubled his dose on 08/20/23. He has also been under a lot of stress. Will hold warfarin for  2 days and resume 5mg  daily.    PLAN:   Continue warfarin 5mg  daily (35mg /week)  Return in 1 month (on 09/24/2023).  Instructed to monitor for all s/sx of bleeding/bruising or thromboembolism and seek medical evaluation if necessary.  [name] verbally acknowledged understanding of this plan    Stephanie Coup, [facility]  Clinical Pharmacist, Anticoagulation Clinic      This visit is being conducted over the telephone at the patient's request: Yes  Patient gives verbal consent to proceed and knows there may be a copay/deductible: Yes    Time spent with patient/guardian on this telephone visit: 10 minutes    I am providing medical care to this patient via a telephone visit in place of an in person visit at the request of the patient.

## 2023-09-09 NOTE — Progress Notes (Deleted)
 *** This is a preliminary note -- please do not use for clinical care until signed ***    Walter Rice    [id number]  17-Nov-1985    Attending: Cammie Sickle, MD  Referring Provider: No referring provider defined for this encounter.    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    Previously followed by Dr. Suella Broad (last visit in 2017) around this time he underwent generator replacement. Of note, there have been prior discussions regarding the role of extraction and insertion of new transvenous system from right side (given left venous occlusion). However, at time of most recent generator replacement he was noted to have acceptable lead parameters in unipolar pacing configuration and therefore extraction was deferred.     Walter Rice was last seen by Dr. Luberta Robertson on 06/13/2021 at which time he reestablish care after moving back to [facility] from [facility]. Plan to continue monitoring atrial lead function with discussions to either observe or reconsider atrial lead extraction at time of generator replacement.     Received alert for RV lead oversensing of non-physiologic lead noise.   Atrial lead low impedance    Last remote check was in ***. Walter Rice {.:[id number]} had symptoms of syncope or presyncope.  Walter Rice denies acute weight changes, lower extremity edema, orthopnea, or PND.  The patient is able to climb {dmt stairs:113048} flights of stairs with minimal dyspnea. The patient denies chest pain, or awareness of fast or slow heart rates or palpitations. Blood pressures at home have been running normal.      Walter Rice presents today for routine follow up for device evaluation.    Patient Active Problem List    Diagnosis Date Noted     CVA (cerebral vascular accident) (HCC) [I63.9] 09/10/2011    Sprain of ankle- Right 06/2011 [S93.409A] 06/16/2011    H/O mitral valve replacement with mechanical valve [Z95.2] 06/11/2022    Encounter for interrogation of cardiac pacemaker [Z45.018] 07/02/2016     Medtronic dual chamber left sided endocardial system. 1999.  2008 gen change.  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     A) Status post repair with ventricular septal defect closure in February 1989.   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 Jude prosthetic valve in 1993.        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  According to interrogation by Dr. Tomasa Blase in March, 2021, there is no underlying P wave. Capture thresholds were acceptable.            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  TX FROM [facility]      Headache [R51.9]      TX FROM [facility]      Mitral  valve replaced [Z95.2] 04/13/2013    Chronic anticoagulation [Z79.01] 02/07/2013     [place] [facility] ENROLLED  11/11/18: discharged from service for noncompliance with f/u  06/13/21: re-enrolled        Outpatient Medications Prior to Visit   Medication Sig Dispense Refill    Acetaminophen 500 MG Oral Tab Take 2 tablet by mouth every 8 hours if needed to relieve pain 60 Tab prn    warfarin 5 MG tablet Take 1 tablet (5 mg) by mouth daily. or as directed by [facility] 90 tablet 1     No facility-administered medications prior to visit.       I have reviewed the patient's medical history, social history and allergies  in detail and updated the computerized patient record.       DATA    Wt Readings from Last 4 Encounters:   06/13/21 81.2 kg (179 lb)   08/03/18 73 kg (161 lb)   07/21/16 79.3 kg (174 lb 12.8 oz)   01/24/16 78.5 kg (173 lb)       DEVICE ANALYSIS  Reviewed by [place] today. Full report in device progress note.       01/11/2015    10:00 AM 07/18/2015     4:00 PM 01/24/2016    12:00 PM 06/19/2016     9:00 AM 06/17/2021     8:00 AM 10/02/2021    12:00 PM 10/11/2021     5:00 PM   CIED   Visit Type   Clinic Remote Remote Remote Clinic   Recall   No No No No No   Pacing Mode AAIR<=>DDDR 60-180 DDDR/AAIR 60-180 VVI 65 DDDR/AAIR 60-180 AAIR=DDDR 60 - 180 AAIR=DDDR 60 - 180 AAIR=DDDR 60 - 180   Type PPM PPM PPM PPM PPM PPM PPM   Pacemaker Dependent   No No No No No   Manufacturer Medtronic Medtronic Medtronic Medtronic Medtronic Medtronic Medtronic   Elective Replacement Indicator 18 months 2.59 ERI    ERI   Generator Model Adapta L Adapta L Adapta L Adapta L Adapta L Adapta L ADDRL1 Adapta L ADDRL1   Date of Generator Implant 10/28/2006 10/28/2006 10/28/2006 10/28/2006 02/08/2016 02/08/2016 02/08/2016   Generator Serial Number [id number]   Lead #1 Type Atrial Atrial Atrial Atrial Atrial Atrial Atrial   Model # [id number] 4068 [id number] [id number]   Lead #1 Serial # [id number] V [id number] V [id number] V [id number] V [id number] V [id number] V [id number]   Lead #1 Implant Date 11/17/1997 11/17/1997 11/17/1997 11/17/1997 11/17/1997 11/17/1997 11/17/1997   Lead #2 Type RV RV RV RV RV RV RV   Lead #2 Model # [id number] [id number] [id number]   Lead #2 Serial # [id number] V [id number] V [id number] V [id number] V [id number] V [id number] V [id number] V   Lead #2 Implant Date 11/17/1997 11/17/1997 11/17/1997 11/17/1997 11/17/1997 11/17/1997 11/17/1997   Lead # 4 Serial #  remote        Date of Service  07/18/2015  06/19/2016 06/13/2021 10/02/2021 10/11/2021   Battery Voltage 2.73 2.71V/<3 months 2.68 2.79V / 10.5 Years 2.77V   7 Yrs 2.78V/6  yrs 2.77V/6 yrs   P-Wave 0     NR NR   R-Wave 15.6-22.4 11.2 0 11.2 22.4 NR 8   Atrial impedence 264 232  291 286 277 274   RV impedence 1558 1608 1272 1584 412 789 868   Atrial Threshold 1.25V@.76 2.5@0 .4  1.25@0 .4 1.25@0 .4 1.125V@0 .4 1.25 @ 0.4   RV Threshold 1V@0 .4 1.125@0 .4 .75V@1 .0 1.125@0 .4 1.0@0 .4 0.875V@0 .4 1.25 @  0.4   Atrial Events 0 0  None None 0    Ventricular Events 6 0 25 VHR 2 NS None 0 binned at [facility]   % atrial pacing 99.6 99.2  99 99 100 99   % ventricular pacing 0 1.4 5.7 2 0 3 3   Underlying Rhythm  n/a  NA No P @ 40 bpm NA Junctional rhythm           IMPRESSION AND PLAN  Kiegan is 37 year old year old male status post {dmt PM 2:113051} pacemaker.      1.  Device Assessment:  The device demonstrates appropriate pacing and sensing function.  Battery status, capture thresholds, and lead impedances are all stable and testing within normal limits. {dmt pacing:113047} pacing ***% of the time.  We did not make any changes to the device today.      There are no Patient Instructions on file for this visit.    Medications prescribed today:  ***  Medications discontinued today:  ***    Follow up:   The patient is enrolled in home monitoring. I Plan to see patient back in clinic in {dmt months:113052} months for device evaluation. We will monitor remote transmissions in between clinic visits. Lamondre was instructed to call with any change in symptoms including palpitations, fast/slow heart rates, chest pain, dizziness, lightheadedness, fatigue, lower extremity edema, shortness of breath, dyspnea on exertion, pre-syncope and syncope.    Moshe Salisbury, PhD, ARNP  Assistant Professor  Division of Cardiology, Electrophysiology  [employer]      CC: No primary care provider on file.  CC: No referring provider defined for this encounter.    Excluding the time spent performing the procedure, I spent a total of *** minutes for the patient's care on the date of the service.  {Vanishing Tip  When  performed by provider on date of visit, these count toward total time  Chart review   History taking  Physical exam  Counseling, educating patient/family/caregiver  Orders   Referrals and communication with other providers (not separately reported)  Documentation  Care coordination (not separately reported)  Independent interpretation of results (not separately reported) and communicating results to patient/family/caregiver  :999}

## 2023-09-10 ENCOUNTER — Ambulatory Visit: Payer: Medicare Other

## 2023-09-11 ENCOUNTER — Telehealth (HOSPITAL_BASED_OUTPATIENT_CLINIC_OR_DEPARTMENT_OTHER): Payer: Self-pay | Admitting: Clinical Cardiac Electrophysiology

## 2023-09-11 NOTE — Telephone Encounter (Signed)
 Pt called and spoke with pt.  Pt reports having palpitation and hearing HR, denied dizziness, pre-syncope, CP, or SOB.     He has hx of atrioventricular septal defect ) s/p repair including mechanical mitral valve replacement (MVR), and SND s/p pacemaker. Last EP OV 06/2021.     He doesn't have home monitor like BP machine to assess HR.   He confirmed on warfarin and is f/u with [name] clinic.   Recommended pt to send in a transmission of his Medtronic PPM.     Also recommended to schedule for in-person EP f/u appt this or next month. Will send the scheduling request to the scheduling team.     ER precautions reviewed.     Pt verbalized understanding and agreed to the plan.    Will f/u transmission.

## 2023-09-11 NOTE — Telephone Encounter (Signed)
 Walter Rice calls back to discuss further. His primary symptoms at the moment are palpitations. He clarifies that he is not "hearing" the heart rate in his ears, but rather hearing the palpitations from his chest. He states that family member could "hear his heart" from the same room.  Of note, he DOES have a mechanical mitral valve.     He called [employer] regarding sending in a remote transmission, [employer] will send him a new monitor, but it will take 10 days to arrive.     He does have a BP cuff at home and today BP was measured at: 129/114, HR 135bpm.     Discussed the following plan:   - If any dizziness, lightheadedness seek ED care and have someone drive him  -Check BP in 30 minutes, if HR still >100 seek ED assessment and have someone drive him

## 2023-09-14 ENCOUNTER — Telehealth (HOSPITAL_BASED_OUTPATIENT_CLINIC_OR_DEPARTMENT_OTHER): Payer: Self-pay | Admitting: Clinical Cardiac Electrophysiology

## 2023-09-14 NOTE — Telephone Encounter (Signed)
 Will message schedulers to call pt and schedule EpAPP appt (pt did not come to last weeks App appt).  Called late last week with symptoms.

## 2023-09-15 NOTE — Telephone Encounter (Signed)
 I called and spoke to patient to confirm appointment below. Reminder letter to be sent out today and I will follow up on this again in mid January to send another reminder letter.    Location: [employer]                 [address]                  [place], [place] [address]    Provider:EP APP  Visit Type:In-Person visit  Appointment Date: 11/20/23  Time: 400p  Comment: Overdue f/u    Thank you,  Violeta Gelinas  [facility] - EP [facility]  Ph: [phone]

## 2023-09-16 ENCOUNTER — Other Ambulatory Visit (HOSPITAL_BASED_OUTPATIENT_CLINIC_OR_DEPARTMENT_OTHER): Payer: Self-pay | Admitting: Cardiovascular Disease

## 2023-09-17 ENCOUNTER — Telehealth (HOSPITAL_BASED_OUTPATIENT_CLINIC_OR_DEPARTMENT_OTHER): Payer: Self-pay | Admitting: Clinical Cardiac Electrophysiology

## 2023-09-17 NOTE — Telephone Encounter (Signed)
 Transmission received on Medtronic pacemaker on 09/16/23 at 1:52PM    Noted pt reporting symptoms of palpitation last week and pending new monitor to send transmission.     Presenting EGM is AFLVs @ 136 bpm, noted atrial undersensing,     There are 13 AHR and 13 VHR episodes, all binned on 09/16/23 between 8:12 am- 1:25 pm. EGMs with marker channel data only reviewed, some indicate ST/SVT and AF/AFL, ventricular rates up to 180 bpm. Atrial undersensing noted, likely underestimated AF/AFL burden. There are events binned since early December, cannot confirm if events terminated or ongoing,  d/t undersensing and pt reported symptoms.     Pt known RA lead noise with low impedances prev, however EGMs indicate likely true AF/AFL.     Pt on OAC for hx of CVA    PMH-atrioventricular septal defect (AVSD) s/p repair including mechanical mitral valve replacement (MVR), sinus node dysfunction s/p dual pacemaker (lead implant 1999), gen change 2017,known atrial lead low impedance     Report in MM    Will update team     Alice Rieger RN

## 2023-09-17 NOTE — Telephone Encounter (Signed)
 Spoke with patient and offered earlier appointment with EP APP. Patient not able to schedule at the time of call and requested follow up the afternoon of 12/13.

## 2023-09-18 LAB — CARDIAC DEVICE CHECK - REMOTE
AT Burden Percent: 0.4
Atrial Pacing Percent: 91
Battery Remaining Longevity: 52
Battery Voltage: 2.76
RA Impedance: 265
RA Threshold Amplitude: 1.25
RV Impedance: 792
RV Pacing Percent: 9
RV Threshold Amplitude: 1.25

## 2023-09-18 NOTE — Telephone Encounter (Signed)
 Spoke with patient and they decided they want to just keep their appointment in Feb.

## 2023-09-24 ENCOUNTER — Ambulatory Visit: Payer: Medicare Other

## 2023-09-29 ENCOUNTER — Encounter (HOSPITAL_BASED_OUTPATIENT_CLINIC_OR_DEPARTMENT_OTHER): Payer: Self-pay

## 2023-09-29 NOTE — Progress Notes (Signed)
Walter Rice is 3 days overdue for lab testing related to anticoagulant therapy. Left reminder message by telephone and mailed reminder letter today.

## 2023-09-30 ENCOUNTER — Ambulatory Visit (HOSPITAL_BASED_OUTPATIENT_CLINIC_OR_DEPARTMENT_OTHER): Payer: Medicare Other

## 2023-09-30 ENCOUNTER — Ambulatory Visit: Payer: Medicare Other | Attending: Cardiovascular Disease

## 2023-09-30 DIAGNOSIS — I495 Sick sinus syndrome: Secondary | ICD-10-CM

## 2023-09-30 DIAGNOSIS — Z950 Presence of cardiac pacemaker: Secondary | ICD-10-CM

## 2023-10-01 ENCOUNTER — Encounter (HOSPITAL_BASED_OUTPATIENT_CLINIC_OR_DEPARTMENT_OTHER): Payer: Self-pay

## 2023-10-01 NOTE — Progress Notes (Signed)
Walter Rice is 1 week overdue for lab testing related to anticoagulant therapy.  Left reminder message by telephone.

## 2023-10-09 ENCOUNTER — Other Ambulatory Visit (HOSPITAL_BASED_OUTPATIENT_CLINIC_OR_DEPARTMENT_OTHER): Payer: Self-pay | Admitting: Pharmacist

## 2023-10-09 DIAGNOSIS — Z952 Presence of prosthetic heart valve: Secondary | ICD-10-CM

## 2023-10-09 LAB — PROTHROMBIN TIME: Prothrombin INR, External: 3.4

## 2023-10-12 ENCOUNTER — Other Ambulatory Visit (HOSPITAL_BASED_OUTPATIENT_CLINIC_OR_DEPARTMENT_OTHER): Payer: Self-pay | Admitting: Pharmacist

## 2023-10-12 ENCOUNTER — Ambulatory Visit: Payer: Medicare Other | Admitting: Pharmacist

## 2023-10-12 DIAGNOSIS — Z952 Presence of prosthetic heart valve: Secondary | ICD-10-CM

## 2023-10-12 DIAGNOSIS — Z8673 Personal history of transient ischemic attack (TIA), and cerebral infarction without residual deficits: Secondary | ICD-10-CM

## 2023-10-12 DIAGNOSIS — Z7901 Long term (current) use of anticoagulants: Secondary | ICD-10-CM

## 2023-10-12 DIAGNOSIS — Z5181 Encounter for therapeutic drug level monitoring: Secondary | ICD-10-CM

## 2023-10-12 MED ORDER — WARFARIN SODIUM 5 MG OR TABS
5.0000 mg | ORAL_TABLET | Freq: Every day | ORAL | 1 refills | Status: DC
Start: 2023-10-12 — End: 2024-01-12

## 2023-10-12 NOTE — Addendum Note (Signed)
 Addended by: [name] on: 10/12/2023 05:14 PM     Modules accepted: Level of Service

## 2023-10-12 NOTE — Telephone Encounter (Signed)
 ANTICOAGULATION TREATMENT PLAN     Indication: MVR; recurrent CVA  Goal INR: 2.5-3.5  Duration of Therapy: chronic  Referral renewal: 05/27/23     Hemorrhagic Risk Score: 1  Warfarin Tablet Size: 5mg      Relevant Historic Information: CVA 2008; 2005; 2006     Referring Provider: Nadene Rice     02/08/16: Pacemaker generator change INR goal ~ 2.5 with no heparin bridge.  3/18: stopped smoking ~12/13/16  Aug 2018: started smoking again  11/11/18:discharge from service due to noncompliance with f/u  06/13/21: re-establish care  07/04/22: denies tobacco use    Distant Site Telemedicine Encounter  I conducted this encounter via secure, live, audio only conference with the patient. I reviewed the risks and benefits of telemedicine as pertinent to this visit and the patient agreed to proceed.    Provider Location: On-site location (clinic, hospital, on-site office)  Patient Location: At home    Present with patient: No one else present        SUBJECTIVE:   Walter Rice was last evaluated by [facility] on 08/25/23. His INR was 4.8 and he was instructed to continue warfarin 5mg  daily.       Today, pt reports by phone:       S/sx of bleeding/bruising: No, Denies problems in this area  S/sx of CVA/TIA (HA, visual changes):  No, Denies problems in this area   Diet/Appetite: Pt feel like he was able to maintain a pretty stable diet, except for Thanksgiving.  He is still including 1-2 servings per day of green vegetables.   EtOH:  Pt had 2 beers on New Year's Eve  Tobacco: denies use  Activity:   Pt notes the activity level is good.  Pt did quite a bit of walking preparing for the holidays (i.e. shopping).   Acute illness: The holidays were more stressful for the patient.  He did have to consult with EP regarding his pacemaker acting strangely one day in Dec.  This resolved.   Upcoming procedures:  denies        Present dose: Warfarin 5mg  daily     DATE INR Warfarin Dose   03/07/22 2.4 7.5mg  MF, 5mg  AOD   04/18/22 4.1 Hold x1,  7.5mg  MF, 5mg  AOD   05/02/22 3.9 Hold x1, 7.5mg  MF, 5mg  AOD   05/16/22 5.9 hold   05/17/22   hold   05/18/22   2.5mg    05/19/22   5mg    05/20/22   5mg    05/21/22   5mg    05/22/22   5mg    05/23/22 4.9 hold x2, 2.5mg  Su (instead of Mon)/F, 5mg     05/28/22 3.2 2.5mg  MF, 5mg  aod    06/04/22 3.1 2.5mg  MF, 5mg  aod    06/12/22 No show 2.5mg  MF, 5mg  aod    07/03/22 2.2 Missed dose 9/28  7.5mg  9/29, 2.5mg  MF, 5mg  aod   07/18/22 2.9 2.5mg  MF, 5mg  aod  Missed 1 dose   08/22/22 2.2 7.5mg  11/18, 2.5mg  MF, 5mg  aod   09/19/22 1.9 10mg  12/17, 5mg  daily   10/01/22 missed 5mg  daily    10/10/22 2.2 7.5mg  M, 5mg  aod   10/28/22 2.7 7.5mg  M, 5mg  aod   11/20/22 missed 7.5mg  M, 5mg  aod   12/04/22 3.9 2.5mg  x1, 5mg  daily   12/25/22 1.9 7.5mg  F, 5mg  aod   01/23/23 3.3 7.5mg  F, 5mg  aod   02/26/23 missed 7.5mg  F, 5mg  aod   03/06/23 4.1 HOLD x1, 7.5mg  F, 5mg  aod  04/03/23 2.8 7.5mg  F, 5mg  all others    05/01/23 missed 7.5mg  F, 5mg  aod   05/12/23 4.9 holdx1, then 5mg  daily   05/22/23 missed 5mg  daily   05/29/23 3.0 5mg  daily   06/26/23 missed 5mg  daily   07/03/23 3.4 5mg  daily   08/25/23 4.8 Holdx2, then 5mg  daily   10/09/23 3.4 PLAN: 5mg  daily   11/13/23 INR        OBJECTIVE:     Relevant medication changes: None.  Med list updated/reviewed with patient today.       ASSESSMENT:   Walter Rice is anticoagulated with warfarin for MVR and recurrent CVA with an INR goal of 2.5-3.5.    INR stable on present warfarin maintenance dose.    Appropriate to continue present warfarin maintenance dose for now in pt without any bleeding concerns or events.      PLAN:   Continue warfarin 5mg  daily (35mg /wk)  Return in 1 month (on 11/13/2023). INR @ [facility].   Walter Rice verbally expressed understanding of the above plan.        Walter Rice, PharmD , CACP

## 2023-11-05 NOTE — Progress Notes (Signed)
 Encounter Tier Level: Tiers: Tier 2 (Complex, multiple supports, usually in person)      Core Service(s): Referral For [employer] And Social Services Support  Assessments and Screenings: No Assessments Performed      Patient Language: English [22]  Interpreter Used Today: No  Patient Race: Black or African American [2]  White [1]  Patient Ethnicity: Not Hispanic, Latino/a, or Spanish origin [1]  Advanced Directive information given: No  Advance Directives Completed: No  Current Primary Pharmacy: No Pharmacies Listed     Social Situation or needs: Needs attention  Chronic conditions: Stable  Medication adherence: Stable  Mental Health: Stable  Physical Health: Stable  Nutrition: Stable    Encounter Narrative and Comments:  CSC contact patient via phone after a request for assistance from MA. Patient called into the [facility] clinic in search of a Child psychotherapist. CSC contact patient and explain the CSC role.    Patient was in search for legal resources. CSC explained that he cannot give legal advice but can send him resources for legal counseling. Patient understood and agreed to receiving legal resources through email.        Care Coordination Task(s) On Behalf of Client:  Legal resouces sent to email    Upcoming [facility] Appointment(s):  No future appointments.    Upcoming Appointment(s) Outside Of [facility]:  Nov 13 2023 01:30 PM - Anticoagulation - Warfarin Visit  [facility]     Nov 20 2023 04:00 PM - Office Visit  [name]       Copy of HAP Provided To Client: No    Care Coordinator Name and Title: Bennet Organ

## 2023-11-05 NOTE — Telephone Encounter (Signed)
 Pt states he has gone in to clinic requesting to speak with a Child psychotherapist. Pt was told he would get a call back from anyone. Pt still wants to see a Child psychotherapist and would like to see them in person. Please advise.

## 2023-11-13 ENCOUNTER — Ambulatory Visit: Payer: Medicare Other

## 2023-11-19 NOTE — Progress Notes (Signed)
Walter Rice    [id number]  1986/02/19    Attending: Cammie Sickle, MD  Referring Provider: No referring provider defined for this encounter.      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  Hx of left venous occlusion.      Hx of chronically low atrial lead impedence, has been well managed in unipolar pacing.       Was last seen in clinic 06/13/2021 to establish care and moving back from [place].     Has history of RV lead oversensing, but historically has had low pacing percentage in RV.  In early Dec he noted some palpitations, which he did not feel like were longstanding.  Has been feeling well with no awareness of fast heart beats, palpitations, exercise intolerance, lightheadedness, dizziness, orthopnea, weight gain, LE edema or shortness of breath.           Outpatient Medications Prior to Visit   Medication Sig Dispense Refill    Acetaminophen 500 MG Oral Tab Take 2 tablet by mouth every 8 hours if needed to relieve pain 60 Tab prn    warfarin 5 MG tablet Take 1 tablet (5 mg) by mouth daily. or as directed by [facility] 90 tablet 1     No facility-administered medications prior to visit.       I have reviewed the patient's medical history, social history and allergies in detail and updated the computerized patient record.       DATA    Wt Readings from Last 4 Encounters:   06/13/21 81.2 kg (179 lb)   08/03/18 73 kg (161 lb)   07/21/16 79.3 kg (174 lb 12.8 oz)   01/24/16 78.5 kg (173 lb)       DEVICE ANALYSIS  Reviewed by [place] today. Full report in device progress note.       01/11/2015    10:00 AM 07/18/2015     4:00 PM 01/24/2016    12:00 PM 06/19/2016     9:00 AM 06/17/2021     8:00 AM 10/02/2021    12:00 PM 10/11/2021     5:00 PM   CIED    Visit Type   Clinic Remote Remote Remote Clinic   Recall   No No No No No   Pacing Mode AAIR<=>DDDR 60-180 DDDR/AAIR 60-180 VVI 65 DDDR/AAIR 60-180 AAIR=DDDR 60 - 180 AAIR=DDDR 60 - 180 AAIR=DDDR 60 - 180   Type PPM PPM PPM PPM PPM PPM PPM   Pacemaker Dependent   No No No No No   Manufacturer Medtronic Medtronic Medtronic Medtronic Medtronic Medtronic Medtronic   Elective Replacement Indicator 18 months 2.59 ERI    ERI   Generator Model Adapta L Adapta L Adapta L Adapta L Adapta L Adapta L ADDRL1 Adapta L ADDRL1   Date of Generator Implant 10/28/2006 10/28/2006 10/28/2006 10/28/2006 02/08/2016 02/08/2016 02/08/2016   Generator Serial Number [id number]   Lead #1 Type Atrial Atrial Atrial Atrial Atrial Atrial Atrial   Model # [id number] 4068 4068 [id number] [id number]   Lead #1 Serial # [id number] V [id number] V [id number] V [id number] V [id number] V [id number] V [id number]   Lead #1 Implant Date 11/17/1997 11/17/1997 11/17/1997  11/17/1997 11/17/1997 11/17/1997 11/17/1997   Lead #2 Type RV RV RV RV RV RV RV   Lead #2 Model # [id number] 5054 5054 [id number] [id number]   Lead #2 Serial # [id number] V [id number] V [id number] V [id number] V [id number] V [id number] V [id number] V   Lead #2 Implant Date 11/17/1997 11/17/1997 11/17/1997 11/17/1997 11/17/1997 11/17/1997 11/17/1997   Lead # 4 Serial #  remote        Date of Service  07/18/2015  06/19/2016 06/13/2021 10/02/2021 10/11/2021   Battery Voltage 2.73 2.71V/<3 months 2.68 2.79V / 10.5 Years 2.77V   7 Yrs 2.78V/6 yrs 2.77V/6 yrs   P-Wave 0     NR NR   R-Wave 15.6-22.4 11.2 0 11.2 22.4 NR 8   Atrial impedence 264 232  291 286 277 274   RV impedence 1558 1608 1272 1584 412 789 868   Atrial Threshold 1.25V@.76 2.5@0 .4  1.25@0 .4 1.25@0 .4 1.125V@0 .4 1.25 @ 0.4   RV Threshold 1V@0 .4 1.125@0 .4 .75V@1 .0 1.125@0 .4 1.0@0 .4 0.875V@0 .4 1.25 @ 0.4   Atrial Events 0 0  None None 0    Ventricular Events 6 0 25 VHR 2 NS None 0 binned at [facility]   % atrial pacing 99.6 99.2  99 99 100 99   %  ventricular pacing 0 1.4 5.7 2 0 3 3   Underlying Rhythm  n/a  NA No P @ 40 bpm NA Junctional rhythm           IMPRESSION AND PLAN  Walter Rice is 38 year old year old male status post dual chamber pacemaker.      1.  Device Assessment:  The device demonstrates appropriate pacing and sensing function.  Battery status, capture thresholds, and lead impedances are all stable and testing within normal limits. RA pacing 77% and RV pacing 8% of the time.  We did not make any changes to the device today.      2. Atrial fibrillation/atrial flutter  Received alert transmission from Medtronic device showing ongoing episode of atrial flutter @ rate of ~136 bpm.   Patient contacted at this time and noted palpitations, however was unable to come in for sooner appt.  Has been compliant with warfarin.  On interrogation today it appears as though Walter Rice has been in AT/AF since early Dec, though he has been asx during most of this time.  Has not been rate controlled (per histogram above), and was not rate controlled today.  Discussed AF w/patient and importance of rate control.  He has hx of SND so has not been on beta blocker.  Will begin him on metoprolol succinate 50 mg daily with instructions to stop this mediation following cardioversion.  Will have f/u with him ~1 month after cardioversion to discuss symptoms and next steps in management.  Will also plan for him to have echocardiogram once NSR is restored to assess LVEF in the setting of tachycardia.      Following our discussion, the following plan was formulated:     - BEGIN metoprolol succinate 50 mg daily   - DCCV  - F/u in clinic ~1 month after cardioversion to reassess rhythm and formulate long term care plan        Follow up:   The patient is enrolled in home monitoring. I Plan to see patient back in clinic in 1 month for device evaluation. We will monitor remote transmissions in between clinic visits. Walter Rice was instructed to call with any change in symptoms including  palpitations, fast/slow heart rates, chest pain, dizziness, lightheadedness, fatigue, lower  extremity edema, shortness of breath, dyspnea on exertion, pre-syncope and syncope.    Arsenio Katz, PA-C  Division of Cardiology, Electrophysiology  [facility]      CC: No primary care provider on file.  CC: No referring provider defined for this encounter.      I spent a total of 40 minutes for the patient's care on the date of the service.

## 2023-11-20 ENCOUNTER — Ambulatory Visit: Payer: Medicare Other

## 2023-11-20 VITALS — BP 123/74 | HR 126 | Ht 74.0 in | Wt 179.0 lb

## 2023-11-20 DIAGNOSIS — I483 Typical atrial flutter: Secondary | ICD-10-CM | POA: Insufficient documentation

## 2023-11-20 DIAGNOSIS — I495 Sick sinus syndrome: Secondary | ICD-10-CM | POA: Insufficient documentation

## 2023-11-20 DIAGNOSIS — Z950 Presence of cardiac pacemaker: Secondary | ICD-10-CM | POA: Insufficient documentation

## 2023-11-20 DIAGNOSIS — Z952 Presence of prosthetic heart valve: Secondary | ICD-10-CM

## 2023-11-20 DIAGNOSIS — R002 Palpitations: Secondary | ICD-10-CM | POA: Insufficient documentation

## 2023-11-20 LAB — PROTHROMBIN TIME: Prothrombin INR, External: 4.3

## 2023-11-20 MED ORDER — METOPROLOL SUCCINATE ER 50 MG OR TB24
50.0000 mg | EXTENDED_RELEASE_TABLET | Freq: Every day | ORAL | 1 refills | Status: AC
Start: 2023-11-20 — End: ?

## 2023-11-24 ENCOUNTER — Ambulatory Visit: Payer: Medicare Other | Admitting: Pharmacist

## 2023-11-24 ENCOUNTER — Telehealth (HOSPITAL_COMMUNITY): Payer: Self-pay

## 2023-11-24 ENCOUNTER — Other Ambulatory Visit (HOSPITAL_BASED_OUTPATIENT_CLINIC_OR_DEPARTMENT_OTHER): Payer: Self-pay | Admitting: Pharmacist

## 2023-11-24 DIAGNOSIS — Z952 Presence of prosthetic heart valve: Secondary | ICD-10-CM

## 2023-11-24 DIAGNOSIS — Z8673 Personal history of transient ischemic attack (TIA), and cerebral infarction without residual deficits: Secondary | ICD-10-CM

## 2023-11-24 DIAGNOSIS — Z5181 Encounter for therapeutic drug level monitoring: Secondary | ICD-10-CM

## 2023-11-24 DIAGNOSIS — Z7901 Long term (current) use of anticoagulants: Secondary | ICD-10-CM

## 2023-11-24 DIAGNOSIS — I4892 Unspecified atrial flutter: Secondary | ICD-10-CM

## 2023-11-24 LAB — EKG 12 LEAD
Atrial Rate: 252 {beats}/min
P Axis: -51 degrees
Q-T Interval: 266 ms
QRS Duration: 102 ms
QTC Calculation: 385 ms
R Axis: -17 degrees
T Axis: 50 degrees
Ventricular Rate: 126 {beats}/min

## 2023-11-24 NOTE — Telephone Encounter (Signed)
 Cardioversion Scheduling

## 2023-11-24 NOTE — Telephone Encounter (Addendum)
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  3/18: stopped smoking ~12/13/16  Aug 2018: started smoking again  11/11/18:discharge from service due to noncompliance with f/u  06/13/21: re-establish care  07/04/22: denies tobacco use  12/04/23: DCCV    SUBJECTIVE:  Walter Rice was last evaluated by [facility] on 10/12/23. His INR was 3.4 (goal: 2.5-3.5) and he was instructed to continue his warfarin maintenance dose (see below), with follow-up in 1 month. Pt is 2 weeks OVERDUE for INR check.     I spoke to Walter Rice for today's assessment:    S/sx of bleeding/bruising: denies   Signs/sxs of stroke/TIA or DVT/PE:  denies  Acute changes in health: newly diagnosed Aflutter, + palpitations, no other sxs, scheduled for cardioversion 2/28  Diet/Appetite: No changes. He is consistent with 1-2 serving(s) of greens (Vit K rich foods) .  Procedure on 28  Alcohol use: Last alcohol intake was Superbowl Sunday.  Tobacco/marijuana use: vaping  Activity level: No changes since last visit.     UPCOMING PROCEDURES: 12/04/23: DCCV    CURRENT MEDICATIONS PER EPIC:  Current Outpatient Medications   Medication Sig Dispense Refill    Acetaminophen 500 MG Oral Tab Take 2 tablet by mouth every 8 hours if needed to relieve pain 60 Tab prn    metoprolol succinate ER 50 MG 24 hr tablet Take 1 tablet (50 mg) by mouth daily. Take medication once daily until cardioversion 60 tablet 1    warfarin 5 MG tablet Take 1 tablet (5 mg) by mouth daily. or as directed by [facility] ACC 90 tablet 1     No current facility-administered medications for this visit.       RELEVANT MEDICATION/OTC/SUPPLEMENT CHANGES: None  since last ACC visit.    WARFARIN DOSE:   Current dose: 5mg  daily.  He may have doubled up on a dose "here or there".     DATE INR Warfarin Dose   03/07/22 2.4 7.5mg  MF, 5mg  AOD   04/18/22 4.1 Hold x1, 7.5mg  MF, 5mg  AOD   05/02/22 3.9 Hold x1, 7.5mg  MF, 5mg  AOD   05/16/22 5.9 hold   05/17/22   hold   05/18/22   2.5mg    05/19/22   5mg    05/20/22   5mg    05/21/22   5mg    05/22/22   5mg    05/23/22 4.9 hold x2, 2.5mg  Su (instead of Mon)/F, 5mg     05/28/22 3.2 2.5mg  MF, 5mg  aod    06/04/22 3.1 2.5mg  MF, 5mg  aod    06/12/22 No show 2.5mg  MF, 5mg  aod    07/03/22 2.2 Missed dose 9/28  7.5mg  9/29, 2.5mg  MF, 5mg  aod   07/18/22 2.9 2.5mg  MF, 5mg  aod  Missed 1 dose   08/22/22 2.2 7.5mg  11/18, 2.5mg  MF, 5mg  aod   09/19/22 1.9 10mg  12/17, 5mg  daily   10/01/22 missed 5mg  daily    10/10/22 2.2 7.5mg  M, 5mg  aod   10/28/22 2.7 7.5mg  M, 5mg  aod   11/20/22 missed 7.5mg  M, 5mg  aod   12/04/22 3.9 2.5mg  x1, 5mg  daily   12/25/22 1.9 7.5mg  F, 5mg  aod  01/23/23 3.3 7.5mg  F, 5mg  aod   02/26/23 missed 7.5mg  F, 5mg  aod   03/06/23 4.1 HOLD x1, 7.5mg  F, 5mg  aod   04/03/23 2.8 7.5mg  F, 5mg  all others    05/01/23 missed 7.5mg  F, 5mg  aod   05/12/23 4.9 holdx1, then 5mg  daily   05/22/23 missed 5mg  daily   05/29/23 3.0 5mg  daily   06/26/23 missed 5mg  daily   07/03/23 3.4 5mg  daily   08/25/23 4.8 Holdx2, then 5mg  daily   10/09/23 3.4 5mg  daily   11/13/23 missed 5mg  daily   11/20/23 4.3 5mg  daily (possible dosing errors)   11/24/23  PLAN: 2.5mg  x1, 5mg  daily   12/01/23 INR    12/04/23 INR cardioversion     ASSESSMENT:   INR from 2/14 is supratherapeutic (goal: 2.5-3.5). Pt denies any unusual bruising or bleeding, and denies signs/sxs of thrombus or thromboembolism.     Supratherapeutic INR possibly due to dosing errors. Advised pt to consider using a mediset or med reminder app to minimize dosing errors.    Will check INR prior to procedure (DCCV) on 12/04/23. Procedure can be done with therapeutic INR (up to ~4.5).   Informed card ICRU provider that we only have INR from 2/14 within the last 4  weeks since I do not see TEE scheduled.    PLAN:   1.  Take warfarin 2.5mg  today on 11/24/23, then resume 5mg  daily (35mg /week)  2.  Return in 1 week (on 12/01/2023).  3.  Instructed to monitor for all s/sx of bleeding/bruising or thromboembolism and seek medical evaluation if necessary.  4.  Walter Rice verbally acknowledged understanding of this plan.    Alfred Levins, PharmD  11/24/23 10:53 AM    Distant Site Telemedicine Encounter  I conducted this encounter via secure, live, audio only conference with the patient. I reviewed the risks and benefits of telemedicine as pertinent to this visit and the patient agreed to proceed.    Provider Location: On-site location (clinic, hospital, on-site office)  Patient Location: At home    Present with patient: No one else present     The patient prefers audio-only visit.

## 2023-11-25 ENCOUNTER — Ambulatory Visit (HOSPITAL_BASED_OUTPATIENT_CLINIC_OR_DEPARTMENT_OTHER): Payer: Self-pay

## 2023-11-25 LAB — PACEMAKER INTERROGATION IN PERSON
Battery Remaining Longevity: 4.5 a
Brady Statistic RA Percent Paced: 77 %
Brady Statistic RV Percent Paced: 8 %
RA Lead Channel Impedance Value: 8 Ohm
RA Lead Channel Sensing Intrinsic Amplitude: 0.7 mV
RA Lead Channel Setting Pacing Amplitude: 2.5 V
RA Lead Channel Setting Pacing Pulse Width: 0.4 ms
RV Lead Channel Impedance Value: 830 Ohm
RV Lead Channel Sensing Intrinsic Amplitude: 8 mV
RV Lead Channel Setting Pacing Amplitude: 2 V
RV Lead Channel Setting Pacing Pulse Width: 0.4 ms

## 2023-11-27 NOTE — Telephone Encounter (Signed)
  of [facility]   [facility] [employer] to the [employer] at [place].   The following appointment for a Cardioversion has been scheduled for you as part of your Cardiology care at the [facility].      Date of Procedure:  Friday 12/04/23     Check in Time: 0930     Address: [facility]                   [address], [place], [place] [address]                    Check-in at the 2nd Floor [facility] Admitting Room                     (Take the Ambulatory Care Center Elevators to the 2nd Floor. Turn left once out of the elevators and you will see the 2nd Floor [facility] Admitting Room on your right)    Spoke with:  [x]  Patient   []  Left message       BEFORE YOUR PROCEDURE:    I provided the following instructions to the patient (and verified with teach-back):     [x]  Do NOT eat or drink any liquids after midnight the night before your procedure    [x]  Take ALL other morning medications as prescribed with a small sip of water. (Inhalers ok to use, if applicable)     If you missed any doses of your anticoagulation medication (Warfarin) in the three weeks prior to your Cardioversion procedure date, please call our clinic as your procedure may need to be rescheduled. Notify our office if you have any medication changes prior to your procedure.     For your safety, it is important that you follow the instructions listed above about medications, being NPO (not eating or drinking), and transportation.   If you forget to follow any of these guidelines your procedure may need to be rescheduled to another day.        AFTER YOUR PROCEDURE:    You MUST have someone available to drive you home after the procedure.    This does NOT include a taxi, ride share driver or public transportation.    Your procedure and recovery process can take up to 4 hours.    If you had a Cardioversion procedure, you will have to remain on anticoagulation for a minimum of 4 weeks after the procedure.  Please do NOT discontinue or stop the anticoagulation medication unless directed by your cardiologist.       FOLLOW UP APPOINTMENTS:    Your primary/referring provider will schedule a post-procedure follow-up in about 2-4 weeks (unless specified otherwise by your provider).     If you have questions about your appointment, please call [phone].    Thank you for choosing the [facility] for your care.

## 2023-12-01 ENCOUNTER — Ambulatory Visit: Payer: Medicare Other

## 2023-12-01 ENCOUNTER — Encounter (HOSPITAL_COMMUNITY): Payer: Self-pay

## 2023-12-01 LAB — PROTHROMBIN TIME: Prothrombin INR, External: 2.4

## 2023-12-01 NOTE — Anesthesia Preprocedure Evaluation (Addendum)
 Patient: Walter Rice  Procedure Information       Date/Time: 12/04/23 1030    Scheduled providers: Wynona Meals, MD    Procedure: EXTERNAL CARDIOVERSION    Location: [facility]            HPI:     Wen is a 38 year old male with history of sick sinus syndrome status post Medtronic dual chamber pacemaker, atrial fibrillation/atrial flutter presenting for external cardioversion    Relevant Problems   Neuro/Psych   (+) CVA (cerebral vascular accident) (HCC)      Cardio   (+) Atrioventricular septal defect (HCC)   (+) Sinus node dysfunction (HCC)      Cardiac and Vasculature   (+) Nonsustained ventricular tachycardia (HCC)      Vascular Surgery   (+) H/O mitral valve replacement with mechanical valve   (+)  Atrial fibrillation/atrial flutter    Relevant surgical history: No past surgical history on file.      Medications:     Outpatient:   Current Outpatient Medications   Medication Instructions    Acetaminophen 500 MG Oral Tab Take 2 tablet by mouth every 8 hours if needed to relieve pain    metoprolol succinate ER (TOPROL XL) 50 mg, Oral, Daily, Take medication once daily until cardioversion    warfarin (COUMADIN) 5 mg, Oral, Daily, or as directed by [facility]                Review of patient's allergies indicates:  Allergies   Allergen Reactions    Sulfa Antibiotics Skin: Rash       Social History:   Social History     Tobacco Use    Smoking status: Former     Current packs/day: 0.00     Types: Cigarettes     Quit date: 05/07/2015     Years since quitting: 8.5    Smokeless tobacco: Current   Substance Use Topics    Alcohol use: No    Drug use: No       Medical History and Review of Systems  Documentation reviewed: Patient health questionnaire and electronic medical record.    Source of information: Chart review.  Previous anesthesia: Yes (02/08/2016: LMA #4) (general and sedation)    History of anesthetic complications  (-) History of anesthetic complications.  (-)  family history of anesthetic complications.      Functional Status   Able to walk 1 city block (200 yards), able to climb 2 flights of stairs or more without stopping and able to lay flat and still for 30 minutes.   Functional status comments:   - Per PHA, physical disability  - Per PHA, are you experiencing homelessness?- "couch surf"    Pulmonary   - Per PHA, pneumonia/bronchitis with fever or antibiotics use - "unknown"    Neuro/Psych   (+) CVA (2008; 2005; 2006)    Cardiovascular   (+) pacemaker (sick sinus syndrome status post Medtronic dual chamber pacemaker in 1999 w/most recent generator replacement 2017. Last interrogation 11/20/2023)  (+) dysrhythmias (Afib/flutter, AT, NSVT, sinus node dysfunction), atrial fibrillation  (+) congenital heart disease (AVSD s/p MVR)    HEENT   Neg HEENT ROS    Musculoskeletal   Neg musculoskeletal ROS    Skin   negative skin ROS    GI/Hepatic/Renal   neg GI/hepatic/renal ROS    Endo/Immunology   neg endo/other ROS    Hematology   -  History  of left venous occlusion  (+) anticoagulation therapy (warfarin)  Oncology   negative hematology/oncology ROS                 11/20/2023    BP 123/74     Pulse 126 Important      Ht 6\' 2"  (1.88 m)     Wt 81.2 kg (179 lb)     SpO2 96%     BMI 22.98 kg/m     BSA 2.06 m    Physical Exam  Airway  Mallampati:  I  TM distance:  >6 cm  Neck ROM:  Full  Mouth Opening:  Normal    Dental  normal      Cardiovascular  Rhythm:  Regular  Rate:  Normal    Pulmonary  normal             Labs: (last year)    BMP  CBC/Coags   Na - -  Hb - -   K - -  HCT - -   Cl - -  WBC - -   HCO3 - -  PLT - -   BUN - -  INR 4.3 11/20/2023   Cr - -  PT - -   Glu - -  PTT - -         Relevant procedures / diagnostic studies:     EKG 12-Lead 11/20/2023    PROBABLE ATRIAL FLUTTER WITH 2:1 AV CONDUCTION  LEFT VENTRICULAR HYPERTROPHY WITH REPOLARIZATION ABNORMALITY ( CORNELL PRODUCT , ROMHILT-ESTES )  ABNORMAL ECG  WHEN COMPARED WITH ECG OF 13-Jun-2021 07:15,  PROBABLE ATRIAL  FLUTTER HAS REPLACED ATRIAL PACED RHYTHM  CRITERIA FOR SEPTAL INFARCT ARE NO LONGER PRESENT  CRITERIA FOR LATERAL INFARCT ARE NO LONGER PRESENT  ST ELEVATION NOW PRESENT IN ANTERIOR LEADS  T WAVE INVERSION NO LONGER EVIDENT IN ANTERIOR LEADS  Confirmed by LEVY MD, WAYNE ([id number]) on 11/24/2023 12:24:40 PM     Cardiac Device Interrogation 11/20/2023/LK          Perioperative Risk Scores:               ANESTHESIA PLAN   Informed Consent:     Anesthesia Plan discussed with:        Patient    ASA Score:     ASA: 2  Planned Anesthetic Type:      general

## 2023-12-02 ENCOUNTER — Other Ambulatory Visit (HOSPITAL_BASED_OUTPATIENT_CLINIC_OR_DEPARTMENT_OTHER): Payer: Self-pay | Admitting: Pharmacist

## 2023-12-02 ENCOUNTER — Ambulatory Visit: Payer: Medicare Other | Admitting: Pharmacist

## 2023-12-02 DIAGNOSIS — Z5181 Encounter for therapeutic drug level monitoring: Secondary | ICD-10-CM

## 2023-12-02 DIAGNOSIS — Z7901 Long term (current) use of anticoagulants: Secondary | ICD-10-CM

## 2023-12-02 DIAGNOSIS — I4891 Unspecified atrial fibrillation: Secondary | ICD-10-CM

## 2023-12-02 DIAGNOSIS — Z8673 Personal history of transient ischemic attack (TIA), and cerebral infarction without residual deficits: Secondary | ICD-10-CM

## 2023-12-02 DIAGNOSIS — Z952 Presence of prosthetic heart valve: Secondary | ICD-10-CM

## 2023-12-02 DIAGNOSIS — I4892 Unspecified atrial flutter: Secondary | ICD-10-CM

## 2023-12-02 NOTE — Telephone Encounter (Signed)
 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  3/18: stopped smoking ~12/13/16  Aug 2018: started smoking again  11/11/18:discharge from service due to noncompliance with f/u  06/13/21: re-establish care  07/04/22: denies tobacco use  12/04/23: DCCV      SUBJECTIVE:   Walter Rice was last evaluated by [facility] on 11/24/23 to review his INR of 4.3 (goal 2.5-3.5) from 11/20/23. He was instructed to take a 1x reduced warfarin dose and then resume his usual dose and follow up with ACC on 12/01/23.     I spoke with Walter Rice by phone for follow up.     S/sx of bruising/bleeding (including melena/hematuria) No issues/complaints   S/sx of CVA/TIA (HA, visual changes, numbness/tingling, etc): No issues/complaints   S/sx of thromboembolism (chest pain, shortness of breath, extremity pain, redness, or swelling, etc): N/A  Acute illness/changes in health: none   Diet: Pt typically has 1-2 servings of greens per week. However, he had a [employer] smoothie last week that had kale and a small serving of [employer] 2 days ago which is unusual for him.   Alcohol:  None  Marijuana/Tobacco: none  Activity: No change since last visit.    Procedures: DCC on 12/04/23    RELEVANT MEDICATION/OTC/SUPPLEMENT CHANGES: none     OBJECTIVE:     Current  dose: See below. Pt denies omissions or errors in warfarin dosing. Warfarin tablets are the same shape and color.     DATE INR Warfarin Dose   03/07/22 2.4 7.5mg  MF, 5mg  AOD   04/18/22 4.1 Hold x1, 7.5mg  MF, 5mg  AOD   05/02/22 3.9 Hold x1, 7.5mg  MF, 5mg  AOD   05/16/22 5.9 hold   05/17/22   hold   05/18/22   2.5mg    05/19/22   5mg    05/20/22   5mg    05/21/22   5mg    05/22/22   5mg     05/23/22 4.9 hold x2, 2.5mg  Su (instead of Mon)/F, 5mg     05/28/22 3.2 2.5mg  MF, 5mg  aod    06/04/22 3.1 2.5mg  MF, 5mg  aod    06/12/22 No show 2.5mg  MF, 5mg  aod    07/03/22 2.2 Missed dose 9/28  7.5mg  9/29, 2.5mg  MF, 5mg  aod   07/18/22 2.9 2.5mg  MF, 5mg  aod  Missed 1 dose   08/22/22 2.2 7.5mg  11/18, 2.5mg  MF, 5mg  aod   09/19/22 1.9 10mg  12/17, 5mg  daily   10/01/22 missed 5mg  daily    10/10/22 2.2 7.5mg  M, 5mg  aod   10/28/22 2.7 7.5mg  M, 5mg  aod   11/20/22 missed 7.5mg  M, 5mg  aod   12/04/22 3.9 2.5mg  x1, 5mg  daily   12/25/22 1.9 7.5mg  F, 5mg  aod   01/23/23 3.3 7.5mg  F, 5mg  aod   02/26/23 missed 7.5mg  F, 5mg  aod   03/06/23 4.1 HOLD x1, 7.5mg  F, 5mg  aod   04/03/23 2.8 7.5mg  F, 5mg  all others    05/01/23 missed 7.5mg  F, 5mg  aod   05/12/23 4.9 holdx1, then 5mg  daily   05/22/23 missed 5mg  daily   05/29/23 3.0 5mg  daily   06/26/23 missed 5mg  daily   07/03/23 3.4 5mg  daily  08/25/23 4.8 Holdx2, then 5mg  daily   10/09/23 3.4 5mg  daily   11/13/23 missed 5mg  daily   11/20/23 4.3 5mg  daily (possible dosing errors)   11/24/23  2.5mg  x1, 5mg  daily   12/01/23 2.4 Plan: 5mg  daily    12/04/23 INR cardioversion     ASSESSMENT:   Walter Rice requires anticoagulation with warfarin for MVR; recurrent CVA; Aflutter 11/20/23 with goal INR of 2.5-3.5    INR from yesterday slightly below goal likely due to a combination of reduced warfarin dose prescribed on 2/18 and a transient increase in vitamin k consumption. Pt's INRs are typically elevated or on the high end of goal on current dose. Will avoid loading warfarin today as he is scheduled for DCCV in 2 days.     Notified Ronni Rumble of recent INRs. See below.         PLAN:   Continue warfarin 5mg  daily   Avoid greens for the next 2 days   Return in 2 days (on 12/04/2023).   Walter Rice acknowledged understanding of this plan    Walter Rice, PharmD    Patient prefers audio-only visit    Distant Site Telemedicine Encounter  I conducted this encounter via secure, live, audio only  conference with the patient. I reviewed the risks and benefits of telemedicine as pertinent to this visit and the patient agreed to proceed.    Provider Location: Off-site location (home, non-[place] location)  Patient Location: At home    Present with patient: No one else present

## 2023-12-04 ENCOUNTER — Other Ambulatory Visit: Payer: Self-pay

## 2023-12-04 ENCOUNTER — Encounter (HOSPITAL_BASED_OUTPATIENT_CLINIC_OR_DEPARTMENT_OTHER): Payer: Self-pay | Admitting: Transplant

## 2023-12-04 ENCOUNTER — Ambulatory Visit
Admission: RE | Admit: 2023-12-04 | Discharge: 2023-12-04 | Disposition: A | Discharge status: Home / Self Care | Payer: Medicare Other | Attending: Cardiovascular Disease | Admitting: Cardiovascular Disease

## 2023-12-04 ENCOUNTER — Ambulatory Visit (HOSPITAL_BASED_OUTPATIENT_CLINIC_OR_DEPARTMENT_OTHER): Payer: Medicare Other

## 2023-12-04 ENCOUNTER — Ambulatory Visit (HOSPITAL_BASED_OUTPATIENT_CLINIC_OR_DEPARTMENT_OTHER)
Admit: 2023-12-04 | Discharge: 2023-12-04 | Disposition: A | Discharge status: Home / Self Care | Source: Home / Self Care

## 2023-12-04 ENCOUNTER — Ambulatory Visit (HOSPITAL_COMMUNITY): Payer: Self-pay

## 2023-12-04 ENCOUNTER — Other Ambulatory Visit (HOSPITAL_BASED_OUTPATIENT_CLINIC_OR_DEPARTMENT_OTHER): Payer: Self-pay | Admitting: Transplant

## 2023-12-04 ENCOUNTER — Ambulatory Visit (HOSPITAL_COMMUNITY): Payer: Self-pay | Admitting: Anesthesiology

## 2023-12-04 ENCOUNTER — Ambulatory Visit (HOSPITAL_BASED_OUTPATIENT_CLINIC_OR_DEPARTMENT_OTHER): Payer: Self-pay | Admitting: Anesthesiology

## 2023-12-04 VITALS — BP 110/80 | HR 63 | Temp 97.9°F | Resp 16 | Ht 74.0 in | Wt 180.0 lb

## 2023-12-04 DIAGNOSIS — I483 Typical atrial flutter: Secondary | ICD-10-CM | POA: Insufficient documentation

## 2023-12-04 DIAGNOSIS — Z45018 Encounter for adjustment and management of other part of cardiac pacemaker: Secondary | ICD-10-CM | POA: Insufficient documentation

## 2023-12-04 DIAGNOSIS — R002 Palpitations: Secondary | ICD-10-CM

## 2023-12-04 DIAGNOSIS — Z0289 Encounter for other administrative examinations: Secondary | ICD-10-CM

## 2023-12-04 DIAGNOSIS — I4891 Unspecified atrial fibrillation: Secondary | ICD-10-CM

## 2023-12-04 LAB — PROTHROMBIN TIME
Prothrombin INR: 2.6 — ABNORMAL HIGH (ref 0.8–1.3)
Prothrombin Time Patient: 29.4 s — ABNORMAL HIGH (ref 10.7–15.6)

## 2023-12-04 LAB — DUAL CHAMBER PACEMAKER PROGRAMMING IN PERSON

## 2023-12-04 MED ORDER — LIDOCAINE HCL 2 % IJ SOLN
INTRAMUSCULAR | Status: DC | PRN
Start: 2023-12-04 — End: 2023-12-04
  Administered 2023-12-04: 60 mg via INTRAVENOUS

## 2023-12-04 MED ORDER — PROPOFOL 10 MG/ML IV EMUL WRAPPER (OSM ONLY)
INTRAVENOUS | Status: DC | PRN
Start: 2023-12-04 — End: 2023-12-04
  Administered 2023-12-04: 50 mg via INTRAVENOUS
  Administered 2023-12-04: 20 mg via INTRAVENOUS

## 2023-12-04 NOTE — Procedure Nursing Note (Signed)
 ICRU RN Pre and Post Procedure Summary Note    ICRU RN Pre Procedure Summary Note    Planned Procedure:  CV w/device    Planned disposition:      Home    Pt prepped per orders and standards of care in ICRU.  See pre procedure documentation for details of pt's prep.    Pre procedure education provided verbally with pt and their family member.  All questions answered.    Procedure Performed:  CV w/device    Patient Disposition: Home    Ride Home with: Brother      Discharge / Transfer Criteria:      VSS/within 20% of baseline    Pt meets goal for pain relief    Pt meets goal for nausea relief    Return to baseline respiratory status/minimal FiO2 requirements if transferred    Return to baseline neuro status      Discharge Education:      D/C education provided per appropriate [facility] approved document    Follow-up appointments reviewed with patient and/or escort    Patient and/or escort verbalized understanding of teaching    Nursing Narrative: Pt tolerated procedure well. Dr He here to assess pacemaker and settings. Cxray done.

## 2023-12-04 NOTE — Progress Notes (Signed)
 Entered in error

## 2023-12-04 NOTE — Anesthesia Postprocedure Evaluation (Signed)
 Patient: Walter Rice    Procedure Summary:   Date: 12/04/2023  Room/Location: * No operating room entered * / [facility] TEE Appointment Log Location    Anesthesia Start: 11:31 AM  Anesthesia Stop: 11:55 AM    * No procedures listed *  * No Diagnosis Codes entered *    Responsible Provider:   Rolla Etienne, MD  ASA Status: 2     Vitals Value Taken Time   BP 110/67 12/04/23 1230   Temp 36.6 C 12/04/23 1205   Pulse 61 12/04/23 1233   SpO2 99 % 12/04/23 1233   Vitals shown include unfiled device data.    Place of evaluation: PACU    Patient participation: patient participated    Level of consciousness: fully conscious    Patient pain control satisfaction: patient is satisfied with level of pain control    Airway patency: patent    Cardiovascular status during assessment: stable    Respiratory status during assessment: breathing comfortably    Anesthetic complications: no    Intravascular volume status assessment: euvolemic    Nausea / vomiting: patient is not experiencing nausea      Planned post-operative disposition at time of assessment: hospital discharge

## 2023-12-07 ENCOUNTER — Ambulatory Visit (HOSPITAL_BASED_OUTPATIENT_CLINIC_OR_DEPARTMENT_OTHER)

## 2023-12-07 LAB — EKG 12 LEAD
Atrial Rate: 124 {beats}/min
Atrial Rate: 123 {beats}/min
P Axis: -55 degrees
P Axis: -48 degrees
P-R Interval: 186 ms
P-R Interval: 480 ms
Q-T Interval: 278 ms
Q-T Interval: 230 ms
QRS Duration: 120 ms
QRS Duration: 112 ms
QTC Calculation: 399 ms
QTC Calculation: 329 ms
R Axis: -45 degrees
R Axis: 30 degrees
T Axis: 33 degrees
T Axis: 262 degrees
Ventricular Rate: 124 {beats}/min
Ventricular Rate: 123 {beats}/min

## 2023-12-08 ENCOUNTER — Telehealth (HOSPITAL_BASED_OUTPATIENT_CLINIC_OR_DEPARTMENT_OTHER): Payer: Self-pay | Admitting: Clinical Cardiac Electrophysiology

## 2023-12-08 ENCOUNTER — Ambulatory Visit (HOSPITAL_BASED_OUTPATIENT_CLINIC_OR_DEPARTMENT_OTHER): Payer: Self-pay | Admitting: Pharmacist

## 2023-12-08 ENCOUNTER — Ambulatory Visit (HOSPITAL_BASED_OUTPATIENT_CLINIC_OR_DEPARTMENT_OTHER)

## 2023-12-08 NOTE — Progress Notes (Signed)
 INTERVAL HISTORY:    Walter Rice was last evaluated by [facility] on 12/02/23 .  His  INR was 2.4 and he was instructed to continue warfarin 5mg  daily.     Pt is s/p cardioversion 12/04/23.  He had an INR the day of procedure:     Lab Results   Component Value Date    INR 2.6 (H) 12/04/2023           PLAN:    I spoke with patient today. He is presently taking warfarin 5mg  daily.   Recommended an INR weekly for 4 weeks post procedure.   Pt agreed to an INR test at the end of this week.   Appointment moved to 12/11/23.    Theodoro Parma

## 2023-12-08 NOTE — Telephone Encounter (Addendum)
 Patient was called and message left about scheduling 78mo post DCCV appt with Arsenio Katz PA-C. Patient to call back and schedule.        From: He, Loura Back, MD   Sent: 12/04/2023  12:49 PM PST   To: Baltazar Najjar, MD; *   Subject: follow up                                        Patient underwent cardioversion and then had prolonged RV oversense that inhibited pacing. This resolved. Device parameters stable. Known RV and RA oversensing. Has been asymptomatic.     Please arrange post cardioversion follow up. Thanks.

## 2023-12-11 ENCOUNTER — Ambulatory Visit

## 2023-12-14 ENCOUNTER — Encounter (HOSPITAL_BASED_OUTPATIENT_CLINIC_OR_DEPARTMENT_OTHER): Payer: Self-pay | Admitting: Pharmacist

## 2023-12-16 ENCOUNTER — Encounter (HOSPITAL_BASED_OUTPATIENT_CLINIC_OR_DEPARTMENT_OTHER): Payer: Self-pay

## 2023-12-16 NOTE — Progress Notes (Signed)
Walter Rice is 3 days overdue for lab testing related to anticoagulant therapy. Left reminder message by telephone and mailed reminder letter today.

## 2023-12-17 LAB — CARDIAC DEVICE CHECK - REMOTE
AT Burden Percent: 0
Atrial Pacing Percent: 99
Battery Remaining Longevity: 42
Battery Voltage: 2.75
RA Impedance: 277
RA Threshold Amplitude: 1.25
RV Impedance: 837
RV Pacing Percent: 5
RV Threshold Amplitude: 1.25

## 2023-12-18 ENCOUNTER — Encounter (HOSPITAL_BASED_OUTPATIENT_CLINIC_OR_DEPARTMENT_OTHER): Payer: Self-pay | Admitting: Clinical Cardiac Electrophysiology

## 2023-12-22 ENCOUNTER — Encounter (HOSPITAL_BASED_OUTPATIENT_CLINIC_OR_DEPARTMENT_OTHER): Payer: Self-pay

## 2023-12-22 NOTE — Telephone Encounter (Signed)
 December 18, 2023  Franchot Mimes to Shamir, [place]   [place]      12/18/23  8:21 AM  4th attempt... Patient was called and message left about scheduling 52mo post DCCV appt. Patient to call back and schedule.  December 15, 2023  Franchot Mimes to Jameison, H[place]   [place]      12/15/23  9:38 AM  3rd attempt... Patient was called and message left about scheduling 52mo post DCCV appt. Patient to call back and schedule.  December 11, 2023  Franchot Mimes to [place], [place]   [place]      12/11/23  8:43 AM  2nd attempt... Patient was called and message left about scheduling 52mo post DCCV appt. Patient to call back and schedule.

## 2023-12-22 NOTE — Progress Notes (Signed)
Walter Rice is 1 week overdue for lab testing related to anticoagulant therapy.  Left reminder message by telephone.

## 2023-12-24 ENCOUNTER — Encounter (HOSPITAL_BASED_OUTPATIENT_CLINIC_OR_DEPARTMENT_OTHER): Payer: Self-pay

## 2023-12-24 NOTE — Progress Notes (Signed)
Bosco Christopher Lambert is 2 weeks overdue for lab testing related to anticoagulant therapy.  Left reminder message by telephone.

## 2023-12-29 NOTE — Telephone Encounter (Signed)
 Letter sent via mychart re: scheduling 1 mo f/u post DCCV

## 2023-12-30 ENCOUNTER — Ambulatory Visit: Attending: Cardiovascular Disease

## 2023-12-30 ENCOUNTER — Ambulatory Visit (HOSPITAL_BASED_OUTPATIENT_CLINIC_OR_DEPARTMENT_OTHER)

## 2023-12-30 DIAGNOSIS — I495 Sick sinus syndrome: Secondary | ICD-10-CM

## 2023-12-30 DIAGNOSIS — Z950 Presence of cardiac pacemaker: Secondary | ICD-10-CM

## 2024-01-01 LAB — PROTHROMBIN TIME: Prothrombin INR, External: 3.2

## 2024-01-04 ENCOUNTER — Ambulatory Visit: Admitting: Pharmacist

## 2024-01-04 ENCOUNTER — Other Ambulatory Visit (HOSPITAL_BASED_OUTPATIENT_CLINIC_OR_DEPARTMENT_OTHER): Payer: Self-pay | Admitting: Pharmacist

## 2024-01-04 DIAGNOSIS — Z952 Presence of prosthetic heart valve: Secondary | ICD-10-CM

## 2024-01-04 DIAGNOSIS — Z5181 Encounter for therapeutic drug level monitoring: Secondary | ICD-10-CM

## 2024-01-04 DIAGNOSIS — I4891 Unspecified atrial fibrillation: Secondary | ICD-10-CM

## 2024-01-04 DIAGNOSIS — Z7901 Long term (current) use of anticoagulants: Secondary | ICD-10-CM

## 2024-01-04 DIAGNOSIS — Z8673 Personal history of transient ischemic attack (TIA), and cerebral infarction without residual deficits: Secondary | ICD-10-CM

## 2024-01-04 NOTE — Telephone Encounter (Signed)
 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  3/18: stopped smoking ~12/13/16  Aug 2018: started smoking again  11/11/18:discharge from service due to noncompliance with f/u  06/13/21: re-establish care  07/04/22: denies tobacco use  12/04/23: DCCV      Distant Site Telemedicine Encounter  I conducted this encounter via secure, live, audio only conference with the patient. I reviewed the risks and benefits of telemedicine as pertinent to this visit and the patient agreed to proceed.    Provider Location: On-site location (clinic, hospital, on-site office)  Patient Location: At home    Present with patient: No one else present     The patient does not have the capability or does not consent to audiovisual visit.      Visit type: RETURN Phone Visit  Conducted the visit with: patient, Nicolis Boody      SUBJECTIVE:    Walter Rice was last evaluated by [name] on 12/02/23, with INR = 2.4, and instructed to continue warfarin  5mg  daily (35mg /wk), and follow up in 2 days. He had repeat INR on 12/04/23 of 2.6, day of his cardioversion. He was then to check weekly INRs, but did not check until 01/01/24.      Missed doses: no missed/extra warfarin doses or other errors    Hemorrhagic Sx: no issues/complaints  CVA, TIA, headache: no issues/complaints  Thrombotic Sx: N/A  Acute illness/changes in health: none; no palpitations, irregular or rapid heart beats, states he feels very good  Diet/Appetite: eating greens 1-2 time(s) per WEEK  Alcohol use: not assessed  Tobacco use: does not use tobacco products  Activity level: unchanged since last ACC visit  Upcoming procedures: none,  s/p DCCV 12/04/23, did not check weekly INR s/p procedure  Other: will be out of town for 1 month end of May to June, dates TBD  Relevant med changes (RX, OTC, supplements): none      Current Outpatient Medications:     Acetaminophen 500 MG Oral Tab, Take 2 tablet by mouth every 8 hours if needed to relieve pain, Disp: 60 Tab, Rfl: prn    metoprolol succinate ER 50 MG 24 hr tablet, Take 1 tablet (50 mg) by mouth daily. Take medication once daily until cardioversion, Disp: 60 tablet, Rfl: 1    warfarin 5 MG tablet, Take 1 tablet (5 mg) by mouth daily. or as directed by [facility] ACC, Disp: 90 tablet, Rfl: 1      OBJECTIVE / LABS:  DATE INR Warfarin Dose   03/07/22 2.4 7.5mg  MF, 5mg  AOD   04/18/22 4.1 Hold x1, 7.5mg  MF, 5mg  AOD   05/02/22 3.9 Hold x1, 7.5mg  MF, 5mg  AOD   05/16/22 5.9 hold   05/17/22   hold   05/18/22   2.5mg    05/19/22   5mg    05/20/22   5mg    05/21/22   5mg    05/22/22   5mg    05/23/22 4.9 hold x2, 2.5mg  Su (instead of Mon)/F, 5mg     05/28/22 3.2 2.5mg  MF, 5mg  aod    06/04/22 3.1 2.5mg  MF, 5mg  aod    06/12/22  No show 2.5mg  MF, 5mg  aod    07/03/22 2.2 Missed dose 9/28  7.5mg  9/29, 2.5mg  MF, 5mg  aod   07/18/22 2.9 2.5mg  MF, 5mg  aod  Missed 1 dose   08/22/22 2.2 7.5mg  11/18, 2.5mg  MF, 5mg  aod   09/19/22 1.9 10mg  12/17, 5mg  daily   10/01/22 missed 5mg  daily    10/10/22 2.2 7.5mg  M, 5mg  aod   10/28/22 2.7 7.5mg  M, 5mg  aod   11/20/22 missed 7.5mg  M, 5mg  aod   12/04/22 3.9 2.5mg  x1, 5mg  daily   12/25/22 1.9 7.5mg  F, 5mg  aod   01/23/23 3.3 7.5mg  F, 5mg  aod   02/26/23 missed 7.5mg  F, 5mg  aod   03/06/23 4.1 HOLD x1, 7.5mg  F, 5mg  aod   04/03/23 2.8 7.5mg  F, 5mg  all others    05/01/23 missed 7.5mg  F, 5mg  aod   05/12/23 4.9 holdx1, then 5mg  daily   05/22/23 missed 5mg  daily   05/29/23 3.0 5mg  daily   06/26/23 missed 5mg  daily   07/03/23 3.4 5mg  daily   08/25/23 4.8 Holdx2, then 5mg  daily   10/09/23 3.4 5mg  daily   11/13/23 missed 5mg  daily   11/20/23 4.3 5mg  daily (possible dosing errors)   11/24/23  2.5mg  x1, 5mg  daily   12/01/23 2.4 5mg  daily   12/04/23 2.6  5mg  daily   01/01/24 3.2 PLAN: 5mg  daily   01/29/24 INR        ASSESSMENT:  INR therapeutic on current warfarin dose. He is s/p DCCV on 12/04/23. Did not get weekly INR checks, and is now >4 weeks s/p procedure. No longer requires weekly INR, with INR in range.    PLAN:  Continue warfarin  5mg  daily (35mg /wk).  Return in 25 days (on 01/29/2024).  Will be out of town for 1 month, and will let ACC know the dates next [facility] visit.  Patient expressed understanding of this plan.       Mckinley Jewel, PharmD

## 2024-01-12 ENCOUNTER — Other Ambulatory Visit (HOSPITAL_BASED_OUTPATIENT_CLINIC_OR_DEPARTMENT_OTHER): Payer: Self-pay | Admitting: Pharmacist

## 2024-01-12 DIAGNOSIS — Z952 Presence of prosthetic heart valve: Secondary | ICD-10-CM

## 2024-01-12 MED ORDER — WARFARIN SODIUM 5 MG OR TABS
5.0000 mg | ORAL_TABLET | Freq: Every day | ORAL | 1 refills | Status: DC
Start: 2024-01-12 — End: 2024-05-14

## 2024-01-29 ENCOUNTER — Ambulatory Visit

## 2024-02-03 ENCOUNTER — Encounter (HOSPITAL_BASED_OUTPATIENT_CLINIC_OR_DEPARTMENT_OTHER): Payer: Self-pay

## 2024-02-03 NOTE — Progress Notes (Signed)
Walter Rice is 3 days overdue for lab testing related to anticoagulant therapy. Left reminder message by telephone and mailed reminder letter today.

## 2024-02-05 LAB — PROTHROMBIN TIME: Prothrombin INR, External: 3.1

## 2024-02-08 ENCOUNTER — Ambulatory Visit: Admitting: Pharmacist

## 2024-02-08 ENCOUNTER — Other Ambulatory Visit (HOSPITAL_BASED_OUTPATIENT_CLINIC_OR_DEPARTMENT_OTHER): Payer: Self-pay | Admitting: Pharmacist

## 2024-02-08 NOTE — Telephone Encounter (Signed)
 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  3/18: stopped smoking ~12/13/16  Aug 2018: started smoking again  11/11/18:discharge from service due to noncompliance with f/u  06/13/21: re-establish care  07/04/22: denies tobacco use  12/04/23: DCCV    SUBJECTIVE:  Jari Dipasquale was last evaluated by [facility] on 01/04/24. INR was 3.2 (goal: 2.5-3.5) and he was instructed to continue his warfarin maintenance dose (see below), with follow-up in 1 month. Pt is 1 week OVERDUE for INR check.     I spoke to Thresa Lonni Molt for today's assessment:    S/sx of bleeding/bruising: denies   Signs/sxs of stroke/TIA or DVT/PE:  denies  Acute changes in health: denies   Diet/Appetite: No changes. He is consistent with 1-2 serving(s) of greens (Vit K rich foods) per week.   Alcohol use: Not assessed.   Tobacco/marijuana use: None.  Activity level: No changes since last visit.   Other: Going out of June 12-30th    UPCOMING PROCEDURES: None reported.    CURRENT MEDICATIONS PER EPIC:  Current Outpatient Medications   Medication Sig Dispense Refill    Acetaminophen  500 MG Oral Tab Take 2 tablet by mouth every 8 hours if needed to relieve pain 60 Tab prn    metoprolol  succinate ER 50 MG 24 hr tablet Take 1 tablet (50 mg) by mouth daily. Take medication once daily until cardioversion 60 tablet 1    warfarin 5 MG tablet Take 1 tablet (5 mg) by mouth daily. or as directed by [facility] ACC 90 tablet 1     No current facility-administered medications for this visit.       RELEVANT MEDICATION/OTC/SUPPLEMENT CHANGES: None since last ACC visit.    WARFARIN DOSE:   Current dose: 5mg  daily.  No warfarin  dosing errors reported.    OBJECTIVE / LABS:  DATE INR Warfarin Dose   03/07/22 2.4 7.5mg  MF, 5mg  AOD   04/18/22 4.1 Hold x1, 7.5mg  MF, 5mg  AOD   05/02/22 3.9 Hold x1, 7.5mg  MF, 5mg  AOD   05/16/22 5.9 hold   05/17/22   hold   05/18/22   2.5mg    05/19/22   5mg    05/20/22   5mg    05/21/22   5mg    05/22/22   5mg    05/23/22 4.9 hold x2, 2.5mg  Su (instead of Mon)/F, 5mg     05/28/22 3.2 2.5mg  MF, 5mg  aod    06/04/22 3.1 2.5mg  MF, 5mg  aod    06/12/22 No show 2.5mg  MF, 5mg  aod    07/03/22 2.2 Missed dose 9/28  7.5mg  9/29, 2.5mg  MF, 5mg  aod   07/18/22 2.9 2.5mg  MF, 5mg  aod  Missed 1 dose   08/22/22 2.2 7.5mg  11/18, 2.5mg  MF, 5mg  aod   09/19/22 1.9 10mg  12/17, 5mg  daily   10/01/22 missed 5mg  daily    10/10/22 2.2 7.5mg  M, 5mg  aod   10/28/22 2.7 7.5mg  M, 5mg  aod   11/20/22 missed 7.5mg  M, 5mg  aod   12/04/22 3.9 2.5mg  x1, 5mg  daily   12/25/22 1.9 7.5mg  F, 5mg  aod   01/23/23 3.3 7.5mg  F, 5mg  aod   02/26/23 missed 7.5mg   F, 5mg  aod   03/06/23 4.1 HOLD x1, 7.5mg  F, 5mg  aod   04/03/23 2.8 7.5mg  F, 5mg  all others    05/01/23 missed 7.5mg  F, 5mg  aod   05/12/23 4.9 holdx1, then 5mg  daily   05/22/23 missed 5mg  daily   05/29/23 3.0 5mg  daily   06/26/23 missed 5mg  daily   07/03/23 3.4 5mg  daily   08/25/23 4.8 Holdx2, then 5mg  daily   10/09/23 3.4 5mg  daily   11/13/23 missed 5mg  daily   11/20/23 4.3 5mg  daily (possible dosing errors)   11/24/23  2.5mg  x1, 5mg  daily   12/01/23 2.4 5mg  daily   12/04/23 2.6 5mg  daily   01/01/24 3.2 5mg  daily   01/29/24 missed 5mg  daily   02/05/24 3.1 PLAN: 5mg  daily   03/11/24 INR      ASSESSMENT:   INR is therapeutic (goal: 2.5-3.5). Pt denies any unusual bruising or bleeding, and denies signs/sxs of thrombus or thromboembolism.     PLAN:   1.  Continue warfarin 5mg  daily (35mg /week)  2.  Return in 1 month (on 03/11/2024).  3.  Instructed to monitor for all s/sx of bleeding/bruising or thromboembolism and seek medical evaluation if necessary.  4.  Thresa Lonni Molt verbally acknowledged understanding of this plan.    Lathyn Griggs G Divine Hansley,  PharmD  02/08/24 1:53 PM    Distant Site Telemedicine Encounter  I conducted this encounter via secure, live, audio only conference with the patient. I reviewed the risks and benefits of telemedicine as pertinent to this visit and the patient agreed to proceed.    Provider Location: On-site location (clinic, hospital, on-site office)  Patient Location: At home    Present with patient: No one else present     The patient prefers audio-only visit.

## 2024-03-11 ENCOUNTER — Ambulatory Visit (HOSPITAL_BASED_OUTPATIENT_CLINIC_OR_DEPARTMENT_OTHER)

## 2024-03-11 LAB — PROTHROMBIN TIME: Prothrombin INR, External: 3.9

## 2024-03-14 ENCOUNTER — Other Ambulatory Visit (HOSPITAL_BASED_OUTPATIENT_CLINIC_OR_DEPARTMENT_OTHER): Payer: Self-pay | Admitting: Pharmacist

## 2024-03-14 ENCOUNTER — Ambulatory Visit: Admitting: Pharmacist

## 2024-03-14 DIAGNOSIS — Z7901 Long term (current) use of anticoagulants: Secondary | ICD-10-CM

## 2024-03-14 DIAGNOSIS — Z952 Presence of prosthetic heart valve: Secondary | ICD-10-CM

## 2024-03-14 DIAGNOSIS — Z8673 Personal history of transient ischemic attack (TIA), and cerebral infarction without residual deficits: Secondary | ICD-10-CM

## 2024-03-14 DIAGNOSIS — Z5181 Encounter for therapeutic drug level monitoring: Secondary | ICD-10-CM

## 2024-03-14 DIAGNOSIS — I4892 Unspecified atrial flutter: Secondary | ICD-10-CM
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
## 2024-03-16 ENCOUNTER — Other Ambulatory Visit (HOSPITAL_BASED_OUTPATIENT_CLINIC_OR_DEPARTMENT_OTHER): Payer: Self-pay | Admitting: Cardiovascular Disease

## 2024-03-24 LAB — CARDIAC DEVICE CHECK - REMOTE
AT Burden Percent: 0.1
Atrial Pacing Percent: 100
Battery Remaining Longevity: 32
Battery Voltage: 2.75
RA Impedance: 281
RA Threshold Amplitude: 1.625
RV Impedance: 445
RV Pacing Percent: 6
RV Threshold Amplitude: 1.125

## 2024-03-30 ENCOUNTER — Ambulatory Visit: Attending: Cardiovascular Disease

## 2024-03-30 ENCOUNTER — Ambulatory Visit (HOSPITAL_BASED_OUTPATIENT_CLINIC_OR_DEPARTMENT_OTHER)

## 2024-03-30 DIAGNOSIS — Z950 Presence of cardiac pacemaker: Secondary | ICD-10-CM | POA: Insufficient documentation

## 2024-03-30 DIAGNOSIS — I495 Sick sinus syndrome: Secondary | ICD-10-CM

## 2024-04-07 ENCOUNTER — Ambulatory Visit

## 2024-04-12 ENCOUNTER — Encounter (HOSPITAL_BASED_OUTPATIENT_CLINIC_OR_DEPARTMENT_OTHER): Payer: Self-pay

## 2024-04-12 NOTE — Progress Notes (Signed)
Walter Rice is 3 days overdue for lab testing related to anticoagulant therapy. Left reminder message by telephone and mailed reminder letter today.

## 2024-04-22 LAB — PROTHROMBIN TIME
Prothrombin INR, External: 3.7
Prothrombin Time Patient, External: 36.2

## 2024-04-23 ENCOUNTER — Other Ambulatory Visit (HOSPITAL_BASED_OUTPATIENT_CLINIC_OR_DEPARTMENT_OTHER): Payer: Self-pay | Admitting: Pharmacist

## 2024-04-23 DIAGNOSIS — Z952 Presence of prosthetic heart valve: Secondary | ICD-10-CM

## 2024-04-25 ENCOUNTER — Ambulatory Visit

## 2024-04-25 DIAGNOSIS — I4892 Unspecified atrial flutter: Secondary | ICD-10-CM

## 2024-04-25 DIAGNOSIS — Z8673 Personal history of transient ischemic attack (TIA), and cerebral infarction without residual deficits: Secondary | ICD-10-CM

## 2024-04-25 DIAGNOSIS — Z7901 Long term (current) use of anticoagulants: Secondary | ICD-10-CM

## 2024-04-25 DIAGNOSIS — Z5181 Encounter for therapeutic drug level monitoring: Secondary | ICD-10-CM

## 2024-04-25 DIAGNOSIS — Z952 Presence of prosthetic heart valve: Secondary | ICD-10-CM
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
## 2024-05-09 ENCOUNTER — Ambulatory Visit

## 2024-05-13 ENCOUNTER — Ambulatory Visit

## 2024-05-13 LAB — PROTHROMBIN TIME, EXTERNAL
Prothrombin INR, External: 4.8
Prothrombin Time Patient, External: 45

## 2024-05-14 ENCOUNTER — Ambulatory Visit: Admitting: Pharmacist

## 2024-05-14 ENCOUNTER — Other Ambulatory Visit (HOSPITAL_BASED_OUTPATIENT_CLINIC_OR_DEPARTMENT_OTHER): Payer: Self-pay | Admitting: Pharmacist

## 2024-05-14 DIAGNOSIS — Z5181 Encounter for therapeutic drug level monitoring: Secondary | ICD-10-CM

## 2024-05-14 DIAGNOSIS — Z8673 Personal history of transient ischemic attack (TIA), and cerebral infarction without residual deficits: Secondary | ICD-10-CM

## 2024-05-14 DIAGNOSIS — I4892 Unspecified atrial flutter: Secondary | ICD-10-CM

## 2024-05-14 DIAGNOSIS — Z7901 Long term (current) use of anticoagulants: Secondary | ICD-10-CM

## 2024-05-14 DIAGNOSIS — Z952 Presence of prosthetic heart valve: Secondary | ICD-10-CM

## 2024-05-14 NOTE — Telephone Encounter (Signed)
 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  3/18: stopped smoking ~12/13/16  Aug 2018: started smoking again  11/11/18:discharge from service due to noncompliance with f/u  06/13/21: re-establish care  07/04/22: denies tobacco use  12/04/23: DCCV      SUBJECTIVE:   Christepher Melchior was last evaluated by [facility] on 04/25/24.   His INR was 3.7 (goal 2.5-3.5) and he was instructed to continue his current warfarin dose and follow up with ACC in 2 weeks.     I spoke with Thresa by phone today for follow up.     S/sx of bruising/bleeding (including melena/hematuria) Patient denies any signs/symptoms of bleeding or unusual bruising. Denies melena and hematuria. Denies nausea, hematemesis or coffee-ground emesis.    S/sx of CVA/TIA (HA, visual changes, numbness/tingling, etc): No issues/complaints   S/sx of thromboembolism (chest pain, shortness of breath, extremity pain, redness, or swelling, etc): N/A  Acute illness/changes in health:  No recent fevers, cold or flu-like symptoms. Denies N/V/D. He does endorse being under a lot of stress.   Diet: No change since last visit. His diet remains minimal in leafy greens. He eats a salad approximately every other week.   Alcohol:  No recent alcohol.   Marijuana/Tobacco: none   Activity: No change since last visit.   Procedures: none    RELEVANT MEDICATION/OTC/SUPPLEMENT CHANGES: none     OBJECTIVE:     Current  dose:   Warfarin 5mg  daily  (35mg /wk). No errors reported. Warfarin tablets are the same shape and color.       LABS:   Lab Results   Component Value Date    INR 4.8 05/13/2024    INR 3.7 04/22/2024    INR 3.9 03/11/2024    INR 3.1  02/05/2024    INR 3.2 01/01/2024    INR 2.6 12/04/2023        ASSESSMENT:   Jawan Chavarria requires anticoagulation with warfarin for MVR, recurrent CVA & Aflutter with goal INR of 2.5-3.5.    INR remains supratherapeutic for the 3rd consecutive visit. A decrease in warfarin dose is warranted at this time. Fortunately, patient is not exhibiting any signs/sx of serious bleeding.    Unclear etiology for increased warfarin sensitivity except for increased stress.     PLAN:    No warfarin today 05/14/24, then decrease dose to 2.5mg  Mondays and 5mg  on all other days   Return in 13 days (on 05/27/2024).   Thresa Lonni Molt acknowledged understanding of this plan    Durene DELENA Shipper, PharmD    Patient prefers audio-only visit    Distant Site Telemedicine Encounter  I conducted this encounter via secure, live, audio only conference with the patient. I reviewed the risks and benefits of telemedicine as pertinent to this visit and the patient agreed to proceed.    Provider Location: On-site location (clinic, hospital, on-site office)  Patient Location: At home    Present with patient: No one else present

## 2024-05-23 ENCOUNTER — Other Ambulatory Visit (HOSPITAL_BASED_OUTPATIENT_CLINIC_OR_DEPARTMENT_OTHER): Payer: Self-pay | Admitting: Pharmacist

## 2024-05-23 DIAGNOSIS — Z8673 Personal history of transient ischemic attack (TIA), and cerebral infarction without residual deficits: Secondary | ICD-10-CM | POA: Insufficient documentation

## 2024-05-23 DIAGNOSIS — Z952 Presence of prosthetic heart valve: Secondary | ICD-10-CM

## 2024-05-23 DIAGNOSIS — Z7901 Long term (current) use of anticoagulants: Secondary | ICD-10-CM | POA: Insufficient documentation

## 2024-05-23 NOTE — Progress Notes (Signed)
 Renewing ACC referral, pt did not make it to Oct 2024 cardiology appt. Please have pt make cardiology f/u as soon as able in order for [facility] to continue management    Walter Rice, PharmD

## 2024-05-27 ENCOUNTER — Ambulatory Visit

## 2024-06-03 ENCOUNTER — Encounter (HOSPITAL_BASED_OUTPATIENT_CLINIC_OR_DEPARTMENT_OTHER): Payer: Self-pay

## 2024-06-03 LAB — PROTHROMBIN TIME: Prothrombin INR, External: 2.9

## 2024-06-03 NOTE — Progress Notes (Signed)
Walter Rice is 3 days overdue for lab testing related to anticoagulant therapy. Left reminder message by telephone and mailed reminder letter today.

## 2024-06-07 ENCOUNTER — Ambulatory Visit: Attending: Cardiovascular Disease | Admitting: Pharmacist

## 2024-06-07 ENCOUNTER — Other Ambulatory Visit (HOSPITAL_BASED_OUTPATIENT_CLINIC_OR_DEPARTMENT_OTHER): Payer: Self-pay | Admitting: Pharmacist

## 2024-06-07 DIAGNOSIS — Z5181 Encounter for therapeutic drug level monitoring: Secondary | ICD-10-CM

## 2024-06-07 DIAGNOSIS — Z7901 Long term (current) use of anticoagulants: Secondary | ICD-10-CM

## 2024-06-07 DIAGNOSIS — I4892 Unspecified atrial flutter: Secondary | ICD-10-CM

## 2024-06-07 DIAGNOSIS — Z952 Presence of prosthetic heart valve: Secondary | ICD-10-CM

## 2024-06-07 DIAGNOSIS — Z8673 Personal history of transient ischemic attack (TIA), and cerebral infarction without residual deficits: Secondary | ICD-10-CM

## 2024-06-07 NOTE — Telephone Encounter (Signed)
 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  3/18: stopped smoking ~12/13/16  Aug 2018: started smoking again  11/11/18:discharge from service due to noncompliance with f/u  06/13/21: re-establish care  07/04/22: denies tobacco use  12/04/23: DCCV  05/23/2024: needs to f/u with cardiology for continued management      Distant Site Telemedicine Encounter  I conducted this encounter via secure, live, audio only conference with the patient. I reviewed the risks and benefits of telemedicine as pertinent to this visit and the patient agreed to proceed.    Provider Location: Off-site location (home, non-[place] location)  Patient Location: At home    Present with patient: No one else present     The patient does not have the capability or does not consent to audiovisual visit.      Visit type: RETURN Phone Visit  Conducted the visit with: patient, Walter Rice      SUBJECTIVE:    Walter Rice was last evaluated by [facility] on 05/14/24, with INR = 4.8, and instructed to hold warfarin x1 dose, then decrease from 35mg /w to 2.5mg  M, 5mg  other days (32.5mg /wk), and follow up in 2 weeks.      Missed doses: no missed/extra warfarin doses or other errors    Hemorrhagic Sx: no issues/complaints  CVA, TIA, headache: no issues/complaints  Thrombotic Sx: N/A  Acute illness/changes in health: none  Diet/Appetite: diet low in greens  Alcohol use: denies use  Tobacco use: does not use tobacco products  Activity level: unchanged since last ACC visit  Upcoming procedures: none  Other: N/A  Relevant med changes (RX, OTC, supplements): none      Current Outpatient Medications:     Acetaminophen   500 MG Oral Tab, Take 2 tablet by mouth every 8 hours if needed to relieve pain, Disp: 60 Tab, Rfl: prn    metoprolol  succinate ER 50 MG 24 hr tablet, Take 1 tablet (50 mg) by mouth daily. Take medication once daily until cardioversion, Disp: 60 tablet, Rfl: 1    warfarin 5 MG tablet, Dose as of 05/14/24: Take 2.5mg  on Mondays and 5mg  on all other days or as directed by [facility], Disp: , Rfl:       OBJECTIVE / LABS:  Lab Results   Component Value Date    INR 2.9 06/03/2024    INR 4.8 05/13/2024    INR 3.7 04/22/2024    INR 3.9 03/11/2024    INR 3.1 02/05/2024    INR 3.2 01/01/2024       ASSESSMENT:  INR therapeutic after warfarin dose adjustments. Continue current warfarin dose. Reminded him he needs to see her general cardiologist for routine follow up. Last seen 06/2021, and appt cancelled on 07/08/24.    PLAN:  Continue warfarin 2.5mg  M, 5mg  other days (32.5mg /wk).  Return in 24 days (on 07/01/2024).  Patient expressed understanding of this plan.       Bernardino Boor, PharmD

## 2024-06-17 ENCOUNTER — Other Ambulatory Visit (HOSPITAL_BASED_OUTPATIENT_CLINIC_OR_DEPARTMENT_OTHER): Payer: Self-pay | Admitting: Cardiac Electrophysiology

## 2024-06-21 LAB — CARDIAC DEVICE CHECK - REMOTE
AT Burden Percent: 0.1
Atrial Pacing Percent: 99
Battery Remaining Longevity: 31
Battery Voltage: 2.74
RA Impedance: 288
RA Threshold Amplitude: 1.5
RV Impedance: 451
RV Pacing Percent: 5
RV Threshold Amplitude: 0.75

## 2024-06-22 ENCOUNTER — Encounter (HOSPITAL_BASED_OUTPATIENT_CLINIC_OR_DEPARTMENT_OTHER): Payer: Self-pay | Admitting: Cardiovascular Disease

## 2024-06-23 ENCOUNTER — Encounter (HOSPITAL_BASED_OUTPATIENT_CLINIC_OR_DEPARTMENT_OTHER): Payer: Self-pay | Admitting: Pharmacist

## 2024-06-24 ENCOUNTER — Other Ambulatory Visit (HOSPITAL_BASED_OUTPATIENT_CLINIC_OR_DEPARTMENT_OTHER): Payer: Self-pay | Admitting: Pharmacist

## 2024-06-24 ENCOUNTER — Encounter (HOSPITAL_BASED_OUTPATIENT_CLINIC_OR_DEPARTMENT_OTHER): Payer: Self-pay | Admitting: Cardiovascular Disease

## 2024-06-24 DIAGNOSIS — Z952 Presence of prosthetic heart valve: Secondary | ICD-10-CM

## 2024-06-24 MED ORDER — WARFARIN SODIUM 5 MG OR TABS
ORAL_TABLET | ORAL | 0 refills | Status: DC
Start: 2024-06-24 — End: 2024-07-13

## 2024-06-29 ENCOUNTER — Ambulatory Visit (HOSPITAL_BASED_OUTPATIENT_CLINIC_OR_DEPARTMENT_OTHER)

## 2024-06-29 ENCOUNTER — Ambulatory Visit: Attending: Cardiac Electrophysiology

## 2024-06-29 DIAGNOSIS — Z950 Presence of cardiac pacemaker: Secondary | ICD-10-CM | POA: Insufficient documentation

## 2024-06-29 DIAGNOSIS — I495 Sick sinus syndrome: Secondary | ICD-10-CM

## 2024-07-01 ENCOUNTER — Ambulatory Visit

## 2024-07-01 LAB — PROTHROMBIN TIME: Prothrombin INR, External: 2.5

## 2024-07-01 NOTE — Telephone Encounter (Addendum)
 Metoprolol  refill request sent to EP team on Patient Email dated 06/24/24 under Dr. Brion Glaser. This medication is managed by their team and per chart instructions state the following:   Take 1 tablet (50 mg) by mouth daily. Take medication once daily until cardioversion

## 2024-07-01 NOTE — Telephone Encounter (Signed)
 Per chart, Metoprolol  Succinate 50 mg daily take mediation once daily until cardioversion and ordered by Comer Curia, PA.    MyChart message requesting refill of cardiac medications:

## 2024-07-04 ENCOUNTER — Other Ambulatory Visit (HOSPITAL_BASED_OUTPATIENT_CLINIC_OR_DEPARTMENT_OTHER): Payer: Self-pay | Admitting: Pharmacist

## 2024-07-04 ENCOUNTER — Ambulatory Visit (HOSPITAL_BASED_OUTPATIENT_CLINIC_OR_DEPARTMENT_OTHER)

## 2024-07-04 DIAGNOSIS — Z952 Presence of prosthetic heart valve: Secondary | ICD-10-CM

## 2024-07-05 ENCOUNTER — Ambulatory Visit: Admitting: Pharmacist

## 2024-07-05 DIAGNOSIS — Z8673 Personal history of transient ischemic attack (TIA), and cerebral infarction without residual deficits: Secondary | ICD-10-CM

## 2024-07-05 DIAGNOSIS — I4892 Unspecified atrial flutter: Secondary | ICD-10-CM

## 2024-07-05 DIAGNOSIS — Z5181 Encounter for therapeutic drug level monitoring: Secondary | ICD-10-CM

## 2024-07-05 DIAGNOSIS — Z7901 Long term (current) use of anticoagulants: Secondary | ICD-10-CM

## 2024-07-05 DIAGNOSIS — Z952 Presence of prosthetic heart valve: Secondary | ICD-10-CM

## 2024-07-05 NOTE — Telephone Encounter (Signed)
 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  3/18: stopped smoking ~12/13/16  Aug 2018: started smoking again  11/11/18:discharge from service due to noncompliance with f/u  06/13/21: re-establish care  07/04/22: denies tobacco use  12/04/23: DCCV  05/23/2024: needs to f/u with cardiology for continued management    SUBJECTIVE:   Rupert Azzara was last evaluated by [facility] on 06/07/24 regarding his therapeutic INR.     Pt was advised to continue on his current warfarin dose .  He was rescheduled for next INR in 24 days.    I spoke to pt about his INR result from 07/01/24.      Signs of bleeding/bruising: none reported   Signs/sxs of stroke/TIA: none reported  Acute changes in health: none reported  Diet: remains low in greens  Appetite: no changes reported   Activity level: no changes   Alcohol use: none  Tobacco use: none  Other: pt is undecided about his move to [place]  as his girlfriend is busy taking care of her mom who fell yesterday.  He will let ACC know if/when he decides to move to [place] .       Upcoming procedures: none       RELEVANT MEDICATION/OTC/SUPPLEMENT CHANGES: none    OBJECTIVE:   Present warfarin dose: Warfarin dose 2.5 mg on Monday and 5 mg on all other days.  Pt has 5 mg warfarin tablets.  No dosing error (denies missed or extra doses)         LABS:   Lab Results   Component Value Date    INR 2.5 07/01/2024    INR 2.9 06/03/2024    INR 4.8 05/13/2024    INR 3.7 04/22/2024    INR 2.6 12/04/2023            ASSESSMENT:   INR on lower end of therapeutic range on current warfarin dose.  Pt is without any warfarin related complications.  Recommend one time booster  dose to achieve mid range therapeutic INR.  Pt politely denied and would prefer to continue on current warfarin dose.      PLAN:   1.  Continue warfarin 2.5 mg on Monday and 5 mg on all other days         2.  Return in 4 weeks (on 08/02/2024).  3.  Pt agreed to contact ACC if he decides to move to [place] .  4.  Pt verbalized understanding and agreed to above plan.    Jillyn Ahle, PharmD MS CGP    Distant Site Telemedicine Encounter  I conducted this encounter via secure, live, audio only conference with the patient. I reviewed the risks and benefits of telemedicine as pertinent to this visit and the patient agreed to proceed.    Provider Location: Off-site location (home, non-[place] location)  Patient Location: At home    Present with patient: No one else present      Patient prefers audio-only visit

## 2024-07-13 ENCOUNTER — Other Ambulatory Visit (HOSPITAL_BASED_OUTPATIENT_CLINIC_OR_DEPARTMENT_OTHER): Payer: Self-pay | Admitting: Pharmacist

## 2024-07-13 DIAGNOSIS — Z952 Presence of prosthetic heart valve: Secondary | ICD-10-CM

## 2024-07-13 MED ORDER — WARFARIN SODIUM 5 MG OR TABS
ORAL_TABLET | ORAL | 0 refills | Status: AC
Start: 2024-07-13 — End: ?

## 2024-07-31 ENCOUNTER — Encounter (HOSPITAL_BASED_OUTPATIENT_CLINIC_OR_DEPARTMENT_OTHER): Payer: Self-pay | Admitting: Cardiovascular Disease

## 2024-08-02 ENCOUNTER — Ambulatory Visit

## 2024-08-02 LAB — PROTHROMBIN TIME: Prothrombin INR, External: 2.5

## 2024-08-03 ENCOUNTER — Other Ambulatory Visit (HOSPITAL_BASED_OUTPATIENT_CLINIC_OR_DEPARTMENT_OTHER): Payer: Self-pay | Admitting: Pharmacist

## 2024-08-03 ENCOUNTER — Ambulatory Visit: Admitting: Pharmacist

## 2024-08-03 DIAGNOSIS — Z5181 Encounter for therapeutic drug level monitoring: Secondary | ICD-10-CM

## 2024-08-03 DIAGNOSIS — Z952 Presence of prosthetic heart valve: Secondary | ICD-10-CM

## 2024-08-03 DIAGNOSIS — I4892 Unspecified atrial flutter: Secondary | ICD-10-CM

## 2024-08-03 DIAGNOSIS — Z8673 Personal history of transient ischemic attack (TIA), and cerebral infarction without residual deficits: Secondary | ICD-10-CM

## 2024-08-03 DIAGNOSIS — Z7901 Long term (current) use of anticoagulants: Secondary | ICD-10-CM

## 2024-08-03 NOTE — Telephone Encounter (Signed)
 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  3/18: stopped smoking ~12/13/16  Aug 2018: started smoking again  11/11/18:discharge from service due to noncompliance with f/u  06/13/21: re-establish care  07/04/22: denies tobacco use  12/04/23: DCCV  05/23/2024: needs to f/u with cardiology for continued management           Distant Site Telemedicine Encounter  I conducted this encounter via secure, live, audio only conference with the patient. I reviewed the risks and benefits of telemedicine as pertinent to this visit and the patient agreed to proceed.    Provider Location: Off-site location (home, non-[place] location)  Patient Location: At home    Present with patient: No one else present       SUBJECTIVE:   Walter Rice was last evaluated by [facility] on 07/05/24. His INR was 2.5 and he was instructed to continue warfarin 2.5mg  M, 5mg  all other days.       Today, pt reports he moves to [place]  08/06/24.  He has an old medtronic device he needs to return to the EP team.  I gave him phone numbers today for that.      S/sx of bleeding/bruising: No, Denies problems in this area  S/sx of CVA/TIA (HA, visual changes):  No, Denies problems in this area   Diet/Appetite:His appetite is okay.    EtOH: denies use  Tobacco: + vape  Activity:  Pt is busy finishing up all his packing for the move to [place], [place]  where his girlfriend is.    Acute illness: denies illness  Upcoming procedures:  denies        Present dose: Warfarin 2.5mg  M, 5mg  all other days    OBJECTIVE:     Relevant medication changes: None.       LABS:   Lab Results   Component Value Date    INR 2.5 08/02/2024    INR 2.5  07/01/2024    INR 2.9 06/03/2024    INR 4.8 05/13/2024    INR 3.7 04/22/2024    INR 3.9 03/11/2024       ASSESSMENT:   Walter Rice is anticoagulated with warfarin for MVR and history of CVA with an INR goal of 2.5-3.5.    INR therapeutic on his present warfarin maintenance dose. Appropriate to continue present warfarin maintenance dose for now in pt without any bleeding concerns or events.      PLAN:   Continue warfarin 2.5mg  M, 5mg  all other days (32.5mg /wk)  Return in 26 days (on 08/29/2024). INR with his new local provider at [facility] in [place].  Myers verbally expressed understanding of the above plan.  Will discharge patient from service.         Elida Earnie Perry, PharmD , CACP    The patient prefers audio-only visit

## 2024-09-13 NOTE — Telephone Encounter (Signed)
 Called patient about INR results. Referral was placed for [facility] to manage INR results by cardiologist.  Twyla pt [facility] phone number and to call them with further instructions.     Thank you,  Adela Donovan, PharmD, BCPS, CDCES  [facility]

## 2024-09-28 ENCOUNTER — Encounter (HOSPITAL_BASED_OUTPATIENT_CLINIC_OR_DEPARTMENT_OTHER): Payer: Self-pay | Admitting: Pharmacist

## 2024-10-11 NOTE — Telephone Encounter (Signed)
 Can we please call patient and ask him to send a manual transmission? Looks like scheduled send date was 09/26/2024 and was missed. Thank you.

## 2024-10-11 NOTE — Telephone Encounter (Signed)
 Notified pt that he missed his manual scheduled transmission on 09/26/24. Pt states he will send in after he's done cooking.

## 2024-10-12 NOTE — Telephone Encounter (Signed)
 Called patient to let him know we can manage his INR here at [facility]. He can just walk in.
